# Patient Record
Sex: Female | Born: 1970 | Race: Black or African American | Hispanic: No | Marital: Married | Smoking: Never smoker
Health system: Southern US, Community
[De-identification: ages and names within clinical notes are randomized; demographics above are authoritative.]

## PROBLEM LIST (undated history)

## (undated) DIAGNOSIS — B009 Herpesviral infection, unspecified: Secondary | ICD-10-CM

## (undated) DIAGNOSIS — R55 Syncope and collapse: Secondary | ICD-10-CM

## (undated) DIAGNOSIS — J351 Hypertrophy of tonsils: Secondary | ICD-10-CM

## (undated) DIAGNOSIS — G43909 Migraine, unspecified, not intractable, without status migrainosus: Secondary | ICD-10-CM

## (undated) DIAGNOSIS — I10 Essential (primary) hypertension: Secondary | ICD-10-CM

## (undated) HISTORY — DX: Morbid (severe) obesity due to excess calories: E66.01

## (undated) HISTORY — DX: Migraine, unspecified, not intractable, without status migrainosus: G43.909

## (undated) HISTORY — PX: TUBAL LIGATION: SHX77

## (undated) HISTORY — DX: Essential (primary) hypertension: I10

## (undated) HISTORY — DX: Hypertrophy of tonsils: J35.1

## (undated) HISTORY — DX: Herpesviral infection, unspecified: B00.9

## (undated) HISTORY — DX: Syncope and collapse: R55

---

## 2004-11-14 ENCOUNTER — Ambulatory Visit: Payer: Self-pay | Admitting: Internal Medicine

## 2004-11-25 ENCOUNTER — Ambulatory Visit: Payer: Self-pay | Admitting: Family Medicine

## 2005-01-10 ENCOUNTER — Ambulatory Visit: Payer: Self-pay | Admitting: Family Medicine

## 2005-01-10 ENCOUNTER — Ambulatory Visit: Payer: Self-pay | Admitting: *Deleted

## 2005-10-05 ENCOUNTER — Ambulatory Visit: Payer: Self-pay | Admitting: Family Medicine

## 2006-01-01 ENCOUNTER — Encounter (INDEPENDENT_AMBULATORY_CARE_PROVIDER_SITE_OTHER): Payer: Self-pay | Admitting: Family Medicine

## 2006-01-01 ENCOUNTER — Ambulatory Visit: Payer: Self-pay | Admitting: Family Medicine

## 2006-01-03 ENCOUNTER — Ambulatory Visit: Payer: Self-pay | Admitting: Family Medicine

## 2007-04-30 DIAGNOSIS — B009 Herpesviral infection, unspecified: Secondary | ICD-10-CM | POA: Insufficient documentation

## 2007-04-30 DIAGNOSIS — I10 Essential (primary) hypertension: Secondary | ICD-10-CM | POA: Insufficient documentation

## 2007-04-30 DIAGNOSIS — J351 Hypertrophy of tonsils: Secondary | ICD-10-CM | POA: Insufficient documentation

## 2007-04-30 DIAGNOSIS — M545 Low back pain, unspecified: Secondary | ICD-10-CM | POA: Insufficient documentation

## 2007-04-30 DIAGNOSIS — A6004 Herpesviral vulvovaginitis: Secondary | ICD-10-CM | POA: Insufficient documentation

## 2007-04-30 DIAGNOSIS — G5600 Carpal tunnel syndrome, unspecified upper limb: Secondary | ICD-10-CM | POA: Insufficient documentation

## 2009-04-08 ENCOUNTER — Ambulatory Visit: Payer: Self-pay | Admitting: Family Medicine

## 2009-04-08 LAB — CONVERTED CEMR LAB: Microalb, Ur: 1.56 mg/dL (ref 0.00–1.89)

## 2009-06-10 ENCOUNTER — Telehealth (INDEPENDENT_AMBULATORY_CARE_PROVIDER_SITE_OTHER): Payer: Self-pay | Admitting: *Deleted

## 2009-06-21 ENCOUNTER — Ambulatory Visit: Payer: Self-pay | Admitting: Family Medicine

## 2009-06-21 LAB — CONVERTED CEMR LAB
ALT: 24 units/L (ref 0–35)
AST: 18 units/L (ref 0–37)
Alkaline Phosphatase: 59 units/L (ref 39–117)
Basophils Relative: 0 % (ref 0–1)
CO2: 23 meq/L (ref 19–32)
Calcium: 9.6 mg/dL (ref 8.4–10.5)
Chloride: 103 meq/L (ref 96–112)
Creatinine, Ser: 1.09 mg/dL (ref 0.40–1.20)
Eosinophils Absolute: 0 10*3/uL (ref 0.0–0.7)
Eosinophils Relative: 1 % (ref 0–5)
HCT: 40.4 % (ref 36.0–46.0)
HDL: 65 mg/dL (ref >39)
Lymphs Abs: 1.3 10*3/uL (ref 0.7–4.0)
Monocytes Absolute: 0.3 10*3/uL (ref 0.1–1.0)
Monocytes Relative: 7 % (ref 3–12)
Neutro Abs: 2.5 10*3/uL (ref 1.7–7.7)
RBC: 4.39 M/uL (ref 3.87–5.11)
Total Bilirubin: 0.8 mg/dL (ref 0.3–1.2)
Total Protein: 8 g/dL (ref 6.0–8.3)
VLDL: 23 mg/dL (ref 0–40)
Vit D, 25-Hydroxy: 21 ng/mL — ABNORMAL LOW (ref 30–89)
WBC: 4.2 10*3/uL (ref 4.0–10.5)

## 2009-07-08 ENCOUNTER — Ambulatory Visit: Payer: Self-pay | Admitting: Family Medicine

## 2009-07-13 ENCOUNTER — Ambulatory Visit (HOSPITAL_COMMUNITY): Admission: RE | Admit: 2009-07-13 | Discharge: 2009-07-13 | Payer: Self-pay | Admitting: Internal Medicine

## 2009-07-26 ENCOUNTER — Encounter: Admission: RE | Admit: 2009-07-26 | Discharge: 2009-07-26 | Payer: Self-pay | Admitting: Family Medicine

## 2009-11-05 ENCOUNTER — Ambulatory Visit: Payer: Self-pay | Admitting: Family Medicine

## 2011-03-01 ENCOUNTER — Other Ambulatory Visit: Payer: Self-pay | Admitting: Obstetrics and Gynecology

## 2011-03-01 ENCOUNTER — Encounter: Payer: Self-pay | Admitting: Obstetrics and Gynecology

## 2011-03-01 ENCOUNTER — Ambulatory Visit (INDEPENDENT_AMBULATORY_CARE_PROVIDER_SITE_OTHER): Payer: Self-pay | Admitting: Obstetrics and Gynecology

## 2011-03-01 VITALS — BP 168/102 | HR 68 | Temp 98.0°F | Ht 63.0 in | Wt 242.8 lb

## 2011-03-01 DIAGNOSIS — D259 Leiomyoma of uterus, unspecified: Secondary | ICD-10-CM

## 2011-03-01 NOTE — Progress Notes (Signed)
  Subjective:    Patient ID: Janet Sanchez, female    DOB: [birth year], 40 y.o.   MRN: [id number]  HPI 40 yo G10 [id number] with LMP 02/28/2011 presenting today requesting a follow-up on her fibroid uterus. Patient is currently without any complaints. Denies abnormal bleeding, discharge or pelvic pain. Patient reports normal monthly menses lasting 4-5 days associated with mild cramping and occuring every 28-30 days.   PMH: HTN  PSH: c-section x1 and BTL  POBH: 5 Sabs, 4 FT SVD, 1 c/section  PGynH: Denies cyst, or abnormal pap smear. [facility] and will schedule f/u mammogram next week  Social Hx: Denies drinking, smoking or the use of illicit drugs  Family Hx: HTN, DM, no h/o gyn, colon or breast malignancy  Review of Systems  All other systems reviewed and are negative.       Objective:   Physical Exam  Constitutional: She is oriented to person, place, and time. She appears well-developed and well-nourished.  HENT:  Head: Normocephalic and atraumatic.  Neck: Normal range of motion. Neck supple.  Cardiovascular: Normal rate and regular rhythm.   Pulmonary/Chest: Effort normal and breath sounds normal.  Abdominal: Soft. Bowel sounds are normal. She exhibits no distension and no mass. There is no tenderness. There is no rebound and no guarding.       obese  Neurological: She is alert and oriented to person, place, and time.          Assessment & Plan:  40 yo with asymptomatic fibroid uterus  - Pelvic ultrasound ordered - RTC in 3-4 weeks to discuss results of ultrasound - Schedule mammogram as planned - Patient to f/u with PCP next week for further management of her HTN

## 2011-03-03 ENCOUNTER — Ambulatory Visit (HOSPITAL_COMMUNITY)
Admission: RE | Admit: 2011-03-03 | Discharge: 2011-03-03 | Disposition: A | Discharge status: Home / Self Care | Payer: Self-pay | Source: Ambulatory Visit | Attending: Obstetrics and Gynecology | Admitting: Obstetrics and Gynecology

## 2011-03-03 ENCOUNTER — Ambulatory Visit (HOSPITAL_COMMUNITY): Payer: Self-pay

## 2011-03-03 DIAGNOSIS — D259 Leiomyoma of uterus, unspecified: Secondary | ICD-10-CM

## 2011-03-03 DIAGNOSIS — D251 Intramural leiomyoma of uterus: Secondary | ICD-10-CM | POA: Insufficient documentation

## 2013-05-08 ENCOUNTER — Ambulatory Visit: Payer: Self-pay | Attending: Internal Medicine | Admitting: Internal Medicine

## 2013-05-08 ENCOUNTER — Ambulatory Visit: Payer: Self-pay

## 2013-05-08 ENCOUNTER — Encounter: Payer: Self-pay | Admitting: Internal Medicine

## 2013-05-08 VITALS — BP 141/90 | HR 76 | Temp 98.1°F | Resp 16

## 2013-05-08 DIAGNOSIS — I10 Essential (primary) hypertension: Secondary | ICD-10-CM | POA: Insufficient documentation

## 2013-05-08 DIAGNOSIS — N938 Other specified abnormal uterine and vaginal bleeding: Secondary | ICD-10-CM

## 2013-05-08 DIAGNOSIS — N949 Unspecified condition associated with female genital organs and menstrual cycle: Secondary | ICD-10-CM

## 2013-05-08 LAB — CBC WITH DIFFERENTIAL/PLATELET
Eosinophils Relative: 1 % (ref 0–5)
HCT: 35.6 % — ABNORMAL LOW (ref 36.0–46.0)
Lymphocytes Relative: 41 % (ref 12–46)
Lymphs Abs: 1.6 10*3/uL (ref 0.7–4.0)
Monocytes Relative: 8 % (ref 3–12)
Neutrophils Relative %: 50 % (ref 43–77)
RBC: 4 MIL/uL (ref 3.87–5.11)
RDW: 13.3 % (ref 11.5–15.5)
WBC: 4 10*3/uL (ref 4.0–10.5)

## 2013-05-08 NOTE — Progress Notes (Signed)
Patient ID: Janet Sanchez, female   DOB: [birth year], 42 y.o.   MRN: [id number] Patient Demographics  Janet Sanchez, is a 42 y.o. female  MVH:[id number]  [id number]  DOB - [birth year]  Chief Complaint  Patient presents with  . Establish Care  . Gynecologic Exam        Subjective:   Janet Sanchez today is here to establish primary care. Patient is a 42 year old African American female with history of hypertension presented to the clinic for establishing care.patient reports that she's been having some abdominal cramps and occasional nausea and thinks she is pregnant. States that she did urine pregnancy tests and they were negative. Her last menstrual period was on October 4 of this month. Patient states that it only lasted for 3 days and usually her periods are for 5-6 days. She also states that she felt something kicking in her stomach for one time yesterday and now she's anxious. She wants blood pregnancy test patient also reports that she had tubal ligation done 16 years ago. She has 5 children between the age of 24 and 4..   Patient has No headache, No chest pain, No abdominal pain - No Nausea, No new weakness tingling or numbness, No Cough - SOB  Objective:    Filed Vitals:   05/08/13 1632  BP: 141/90  Pulse: 76  Temp: 98.1 F (36.7 C)  TempSrc: Oral  Resp: 16  SpO2: 97%     ALLERGIES:  No Known Allergies  PAST MEDICAL HISTORY: Past Medical History  Diagnosis Date  . Hypertension     PAST SURGICAL HISTORY: Past Surgical History  Procedure Laterality Date  . Tubal ligation      FAMILY HISTORY: Family History  Problem Relation Age of Onset  . Heart attack Mother   . Hypertension Father   . Heart attack Father   . Hypertension Sister   . Hypertension Brother     MEDICATIONS AT HOME: Prior to Admission medications   Medication Sig Start Date End Date Taking? Authorizing Provider  amLODipine (NORVASC) 10 MG tablet Take 10 mg by mouth daily.   Yes  Historical Provider, MD  lisinopril-hydrochlorothiazide (PRINZIDE,ZESTORETIC) 20-25 MG per tablet Take 1 tablet by mouth daily.   Yes Historical Provider, MD    REVIEW OF SYSTEMS:  Constitutional:   No   Fevers, chills, fatigue.  HEENT:    No headaches, Sore throat,   Cardio-vascular: No chest pain,  Orthopnea, swelling in lower extremities, anasarca, palpitations  GI:  No abdominal pain, nausea, vomiting, diarrhea  Resp: No shortness of breath,  No coughing up of blood.No cough.No wheezing.  Skin:  no rash or lesions.  GU:  no dysuria, change in color of urine, no urgency or frequency.  No flank pain.  Musculoskeletal: No joint pain or swelling.  No decreased range of motion.  No back pain.  Psych: No change in mood or affect. No depression or anxiety.  No memory loss.   Exam  General appearance :Awake, alert, NAD, Speech Clear. HEENT: Atraumatic and Normocephalic, PERLA Neck: supple, no JVD. No cervical lymphadenopathy.  Chest: clear to auscultation bilaterally, no wheezing, rales or rhonchi CVS: S1 S2 regular, no murmurs.  Abdomen: soft, NBS, NT, ND, no gaurding, rigidity or rebound. Extremities: No cyanosis, clubbing, B/L Lower Ext shows no edema,  Neurology: Awake alert, and oriented X 3, CN II-XII intact, Non focal Skin:No Rash or lesions Wounds: N/A    Data Review   Basic Metabolic Panel: No results found for  this basename: NA, K, CL, CO2, GLUCOSE, BUN, CREATININE, CALCIUM, MG, PHOS,  in the last 168 hours Liver Function Tests: No results found for this basename: AST, ALT, ALKPHOS, BILITOT, PROT, ALBUMIN,  in the last 168 hours  CBC: No results found for this basename: WBC, NEUTROABS, HGB, HCT, MCV, PLT,  in the last 168 hours ------------------------------------------------------------------------------------------------------------------ No results found for this basename: HGBA1C,  in the last 72  hours ------------------------------------------------------------------------------------------------------------------ No results found for this basename: CHOL, HDL, LDLCALC, TRIG, CHOLHDL, LDLDIRECT,  in the last 72 hours ------------------------------------------------------------------------------------------------------------------ No results found for this basename: TSH, T4TOTAL, FREET3, T3FREE, THYROIDAB,  in the last 72 hours ------------------------------------------------------------------------------------------------------------------ No results found for this basename: VITAMINB12, FOLATE, FERRITIN, TIBC, IRON, RETICCTPCT,  in the last 72 hours  Coagulation profile  No results found for this basename: INR, PROTIME,  in the last 168 hours    Assessment & Plan   Active Problems: Hypertension - Continue amlodipine, lisinopril/HCTZ. Patient has 3 refills on both the medications, continue  DUB: . with history of tubal ligation, anxious that she may be pregnant - Will get a serum hCG test, explained that if it is positive she will need further care in [facility] - If it is negative then, we can have abdominal ultrasound or CT abdomen scan for further workup for abdominal cramps and nausea or try PPI.  Health screening  - Ambulatory referral to OB/GYN for Pap smear - Hold off on flu shot and mammogram until the pregnancy test results are available  Recommendations:CBC, CMP, lipid panel, serum hCG   Follow-up in 1 months for labs and evaluation, patient will be called if the pregnancy test results tomorrow   Falan Hensler M.D. 05/08/2013, 4:57 PM

## 2013-05-08 NOTE — Progress Notes (Signed)
Pt here to establish care for hx HTN,irregular menstrual periods Requesting blood pregnancy test LMP- 04/29/13

## 2013-05-09 ENCOUNTER — Telehealth: Payer: Self-pay | Admitting: Emergency Medicine

## 2013-05-09 LAB — COMPREHENSIVE METABOLIC PANEL
Chloride: 102 mEq/L (ref 96–112)
Creat: 1.22 mg/dL — ABNORMAL HIGH (ref 0.50–1.10)
Glucose, Bld: 79 mg/dL (ref 70–99)
Potassium: 4 mEq/L (ref 3.5–5.3)
Total Bilirubin: 0.8 mg/dL (ref 0.3–1.2)
Total Protein: 7.7 g/dL (ref 6.0–8.3)

## 2013-05-09 LAB — LIPID PANEL
Cholesterol: 189 mg/dL (ref 0–200)
Total CHOL/HDL Ratio: 2.9 Ratio
Triglycerides: 70 mg/dL (ref <150)
VLDL: 14 mg/dL (ref 0–40)

## 2013-05-09 LAB — HCG, QUANTITATIVE, PREGNANCY: hCG, Beta Chain, Quant, S: <2 m[IU]/mL

## 2013-05-09 NOTE — Telephone Encounter (Signed)
Pt given negative pregnancy results 

## 2013-05-12 ENCOUNTER — Telehealth: Payer: Self-pay | Admitting: Emergency Medicine

## 2013-05-12 NOTE — Telephone Encounter (Signed)
Left

## 2013-05-13 NOTE — Progress Notes (Signed)
Quick Note:  Please let the patient know that her pregnancy test was negative ______ 

## 2013-05-13 NOTE — Progress Notes (Signed)
Quick Note:  Please let the patient know that her pregnancy test was negative ______

## 2013-09-03 ENCOUNTER — Telehealth: Payer: Self-pay | Admitting: *Deleted

## 2013-09-03 NOTE — Telephone Encounter (Signed)
Patient is being referred to [place] from [facility]. We are calling to request that patient keep a Menstrual Diary for at least one month and she will be scheduled for an appointment in March.  I called patient and left message for her to call us back regarding some info for her appt. Referral form given to front desk to schedule patient for appt here.

## 2013-09-08 NOTE — Telephone Encounter (Signed)
Called Janet Sanchez and notified her of her appointment date/ time and to keep menstrual diary. Also that she will need to pay copay $20 and get billed the rest  or if has insurance then whatever copay is for that. [place] voices understanding.

## 2013-09-10 ENCOUNTER — Encounter: Payer: Self-pay | Admitting: *Deleted

## 2013-10-15 ENCOUNTER — Encounter: Payer: Self-pay | Admitting: Obstetrics & Gynecology

## 2013-10-15 ENCOUNTER — Ambulatory Visit (INDEPENDENT_AMBULATORY_CARE_PROVIDER_SITE_OTHER): Payer: No Typology Code available for payment source | Admitting: Obstetrics & Gynecology

## 2013-10-15 VITALS — BP 116/78 | HR 88 | Temp 98.3°F | Ht 63.0 in | Wt 224.0 lb

## 2013-10-15 DIAGNOSIS — Z0000 Encounter for general adult medical examination without abnormal findings: Secondary | ICD-10-CM

## 2013-10-15 LAB — POCT PREGNANCY, URINE: Preg Test, Ur: NEGATIVE

## 2013-10-15 NOTE — Progress Notes (Signed)
Pt. States her periods are lasting a shorter period of time and this has occurred since August 2014- concerning her. Also c/o of lower abdominal/pelvic pain and cramping that occurs every few days and has been occuring since august as well.

## 2013-10-15 NOTE — Progress Notes (Signed)
   Subjective:    Patient ID: Janet Sanchez, female    DOB: 1971-05-08, 43 y.o.   MRN: [id number]  HPI 43 yo M AA P5 (41-16 yo kids) here today with the issue of 1) I want to know if I'm pregnant. She feels lots of movement in her abdomen. She did a blood pregnancy test 10/14 and it was negative. 2) She reports that since 8/14 her periods have become irregular. They will come for 3-4 days and occurs at the end of each month.   Review of Systems Pap and mammogram due    Objective:   Physical Exam        Assessment & Plan:  Preventative care- mammogram ordered.  RTC for annual Pregnancy fear- I offered to check a pregnacy test today.

## 2013-10-23 ENCOUNTER — Other Ambulatory Visit: Payer: Self-pay | Admitting: Obstetrics & Gynecology

## 2013-10-23 ENCOUNTER — Ambulatory Visit (HOSPITAL_COMMUNITY)
Admission: RE | Admit: 2013-10-23 | Discharge: 2013-10-23 | Disposition: A | Discharge status: Home / Self Care | Payer: No Typology Code available for payment source | Source: Ambulatory Visit | Attending: Obstetrics & Gynecology | Admitting: Obstetrics & Gynecology

## 2013-10-23 DIAGNOSIS — N63 Unspecified lump in unspecified breast: Secondary | ICD-10-CM

## 2013-10-23 DIAGNOSIS — Z1231 Encounter for screening mammogram for malignant neoplasm of breast: Secondary | ICD-10-CM

## 2013-10-23 DIAGNOSIS — Z0000 Encounter for general adult medical examination without abnormal findings: Secondary | ICD-10-CM

## 2013-11-06 ENCOUNTER — Telehealth: Payer: Self-pay

## 2013-11-06 DIAGNOSIS — R102 Pelvic and perineal pain: Secondary | ICD-10-CM

## 2013-11-06 NOTE — Telephone Encounter (Signed)
Pt. Called stating "I am calling in regards to requesting an ultrasound. I was seen in your clinic on the 25th of last month and I thought at that time I was going to have an ultrasound. I have been having sharp abdominal pain and abnormal cycles. I don't know if I am pregnant. My cycle comes on at the end of every month. I could have a cyst. My fibroids could be enlarged. I want to know where this pain is coming from." From last visit, pt. Had pregnancy test which was negative. Will send message to Dr. Hulan Fray.

## 2013-11-10 NOTE — Telephone Encounter (Signed)
Spoke to Dr. Hulan Fray who stated order pelvic ultrasound. Pelvic and transvag ordered. Ultrasound scheduled for 11/17/13 at 0730. Called pt. And informed her of date and time. Pt. Verbalized understanding and gratitude; no further questions or concerns.

## 2013-11-17 ENCOUNTER — Ambulatory Visit (HOSPITAL_COMMUNITY)
Admission: RE | Admit: 2013-11-17 | Discharge: 2013-11-17 | Disposition: A | Discharge status: Home / Self Care | Payer: No Typology Code available for payment source | Source: Ambulatory Visit | Attending: Obstetrics & Gynecology | Admitting: Obstetrics & Gynecology

## 2013-11-17 DIAGNOSIS — R102 Pelvic and perineal pain: Secondary | ICD-10-CM

## 2013-11-17 DIAGNOSIS — D252 Subserosal leiomyoma of uterus: Secondary | ICD-10-CM | POA: Insufficient documentation

## 2013-11-17 DIAGNOSIS — N949 Unspecified condition associated with female genital organs and menstrual cycle: Secondary | ICD-10-CM | POA: Insufficient documentation

## 2013-11-17 DIAGNOSIS — Z9851 Tubal ligation status: Secondary | ICD-10-CM | POA: Insufficient documentation

## 2013-11-19 ENCOUNTER — Ambulatory Visit (INDEPENDENT_AMBULATORY_CARE_PROVIDER_SITE_OTHER): Payer: No Typology Code available for payment source | Admitting: Obstetrics & Gynecology

## 2013-11-19 ENCOUNTER — Encounter: Payer: Self-pay | Admitting: Obstetrics & Gynecology

## 2013-11-19 VITALS — BP 131/88 | HR 75 | Temp 97.6°F | Ht 64.0 in | Wt 229.5 lb

## 2013-11-19 DIAGNOSIS — Z0000 Encounter for general adult medical examination without abnormal findings: Secondary | ICD-10-CM

## 2013-11-19 NOTE — Progress Notes (Signed)
Subjective:    Janet Sanchez is a 43 y.o. female who presents for an annual exam. The patient has no complaints today. The patient is sexually active. GYN screening history: last pap: was normal. The patient wears seatbelts: yes. The patient participates in regular exercise: yes. Has the patient ever been transfused or tattooed?: yes. The patient reports that there is not domestic violence in her life.   Menstrual History: OB History   Grav Para Term Preterm Abortions TAB SAB Ect Mult Living   10 5 4 1 5  5   5       Menarche age: 89  Patient's last menstrual period was 11/11/2013.    The following portions of the patient's history were reviewed and updated as appropriate: allergies, current medications, past family history, past medical history, past social history, past surgical history and problem list.  Review of Systems A comprehensive review of systems was negative.  Married for 22 years, denies dyspareunia. Uses BTL for contraception. 5 kids (4 at home). Works as a Physicist, medical.   Objective:    BP 131/88  Pulse 75  Temp(Src) 97.6 F (36.4 C) (Oral)  Ht 5\' 4"  (1.626 m)  Wt 229 lb 8 oz (104.101 kg)  BMI 39.37 kg/m2  LMP 11/11/2013  General Appearance:    Alert, cooperative, no distress, appears stated age  Head:    Normocephalic, without obvious abnormality, atraumatic  Eyes:    PERRL, conjunctiva/corneas clear, EOM's intact, fundi    benign, both eyes  Ears:    Normal TM's and external ear canals, both ears  Nose:   Nares normal, septum midline, mucosa normal, no drainage    or sinus tenderness  Throat:   Lips, mucosa, and tongue normal; teeth and gums normal  Neck:   Supple, symmetrical, trachea midline, no adenopathy;    thyroid:  no enlargement/tenderness/nodules; no carotid   bruit or JVD  Back:     Symmetric, no curvature, ROM normal, no CVA tenderness  Lungs:     Clear to auscultation bilaterally, respirations unlabored  Chest Wall:    No tenderness or  deformity   Heart:    Regular rate and rhythm, S1 and S2 normal, no murmur, rub   or gallop  Breast Exam:    No tenderness, masses, or nipple abnormality  Abdomen:     Soft, non-tender, bowel sounds active all four quadrants,    no masses, no organomegaly, obese  Genitalia:    Normal female without lesion, discharge or tenderness, NSSA, NT, normal adnexal exam     Extremities:   Extremities normal, atraumatic, no cyanosis or edema  Pulses:   2+ and symmetric all extremities  Skin:   Skin color, texture, turgor normal, no rashes or lesions  Lymph nodes:   Cervical, supraclavicular, and axillary nodes normal  Neurologic:   CNII-XII intact, normal strength, sensation and reflexes    throughout  .    Assessment:    Healthy female exam.    Plan:     Breast self exam technique reviewed and patient encouraged to perform self-exam monthly. Mammogram. Thin prep Pap smear.

## 2013-12-03 ENCOUNTER — Ambulatory Visit
Admission: RE | Admit: 2013-12-03 | Discharge: 2013-12-03 | Disposition: A | Discharge status: Home / Self Care | Payer: No Typology Code available for payment source | Source: Ambulatory Visit | Attending: Obstetrics & Gynecology | Admitting: Obstetrics & Gynecology

## 2013-12-03 ENCOUNTER — Encounter (INDEPENDENT_AMBULATORY_CARE_PROVIDER_SITE_OTHER): Payer: Self-pay

## 2013-12-03 DIAGNOSIS — N63 Unspecified lump in unspecified breast: Secondary | ICD-10-CM

## 2014-05-25 ENCOUNTER — Encounter: Payer: Self-pay | Admitting: Obstetrics & Gynecology

## 2014-09-16 ENCOUNTER — Other Ambulatory Visit (HOSPITAL_COMMUNITY): Payer: Self-pay | Admitting: Obstetrics and Gynecology

## 2014-09-16 DIAGNOSIS — Z9851 Tubal ligation status: Secondary | ICD-10-CM

## 2014-09-23 ENCOUNTER — Ambulatory Visit (HOSPITAL_COMMUNITY)
Admission: RE | Admit: 2014-09-23 | Discharge: 2014-09-23 | Disposition: A | Discharge status: Home / Self Care | Payer: 59 | Source: Ambulatory Visit | Attending: Obstetrics and Gynecology | Admitting: Obstetrics and Gynecology

## 2014-09-23 DIAGNOSIS — Z9851 Tubal ligation status: Secondary | ICD-10-CM

## 2014-09-23 DIAGNOSIS — N971 Female infertility of tubal origin: Secondary | ICD-10-CM | POA: Diagnosis not present

## 2014-09-23 MED ORDER — IOHEXOL 300 MG/ML  SOLN
20.0000 mL | Freq: Once | INTRAMUSCULAR | Status: AC | PRN
  Administered 2014-09-23: 20 mL

## 2016-06-30 ENCOUNTER — Ambulatory Visit: Payer: No Typology Code available for payment source | Admitting: Adult Health

## 2016-07-06 ENCOUNTER — Ambulatory Visit (INDEPENDENT_AMBULATORY_CARE_PROVIDER_SITE_OTHER): Payer: BLUE CROSS/BLUE SHIELD | Admitting: Adult Health

## 2016-07-06 ENCOUNTER — Encounter: Payer: Self-pay | Admitting: Adult Health

## 2016-07-06 VITALS — BP 120/70 | Temp 97.9°F | Ht 64.0 in | Wt 244.9 lb

## 2016-07-06 DIAGNOSIS — I10 Essential (primary) hypertension: Secondary | ICD-10-CM | POA: Diagnosis not present

## 2016-07-06 DIAGNOSIS — Z7689 Persons encountering health services in other specified circumstances: Secondary | ICD-10-CM | POA: Diagnosis not present

## 2016-07-06 DIAGNOSIS — Z760 Encounter for issue of repeat prescription: Secondary | ICD-10-CM

## 2016-07-06 DIAGNOSIS — R05 Cough: Secondary | ICD-10-CM

## 2016-07-06 DIAGNOSIS — R059 Cough, unspecified: Secondary | ICD-10-CM

## 2016-07-06 MED ORDER — LISINOPRIL 30 MG PO TABS: 30.0000 mg | ORAL_TABLET | Freq: Every day | ORAL | 3 refills | Status: DC

## 2016-07-06 MED ORDER — HYDROCODONE-HOMATROPINE 5-1.5 MG/5ML PO SYRP: 5.0000 mL | ORAL_SOLUTION | Freq: Three times a day (TID) | ORAL | 0 refills | Status: DC | PRN

## 2016-07-06 NOTE — Progress Notes (Signed)
Patient presents to clinic today to establish care. She is a pleasant 45 year old female who  has a past medical history of Herpes; Hypertension; Migraines; and Morbid obesity ([place]).   Her last physical was greater than 1 year ago   Acute Concerns: Establish Care  Cough - Non productive cough x 1 weeks. She denies any fevers or feeling acutely ill. He denies any sinus pain or pressure area has not had any nausea, vomiting,or diarrhea  Chronic Issues: Hypertension  - She reports that she has previously taken amlodipine 10 mg as well as lisinopril 20 mg with 25 mg of hydrochlorothiazide. Her previous provider had done kidney function panel on 05/24/2016 at which time her GFR was 59. At this time patient stopped her lisinopril because she thought it was hurting her kidneys. Since that time she has restarted the lisinopril/hydrochlorothiazide to control her blood pressure. At the time of her renal function panel her creatinine was 1.26. In the office today her blood pressure is very well controlled  Migraines  - She was taking propranolol but she reports that it caused her left knee to swell. She has maybe 5 migraines per month and is controlled with Motrin.    Health Maintenance: Dental -- Routine  Vision -- Routine  Immunizations -- Refuses flu shots.  Colonoscopy -- Never had Mammogram -- One year ago  PAP -- 2017  Bone Density -- Never had   Diet: She does not follow specific diet and does not always eat a heart healthy diet Exercise: She tries to work out three days per week  She is not followed by anyone.    Past Medical History:  Diagnosis Date  . Herpes   . Hypertension   . Migraines   . Morbid obesity ([place])     Past Surgical History:  Procedure Laterality Date  . TUBAL LIGATION      No current outpatient prescriptions on file prior to visit.   No current facility-administered medications on file prior to visit.     No Known Allergies  Family History    Problem Relation Age of Onset  . Heart attack Mother   . Hypertension Father   . Heart attack Father   . Hypertension Sister   . Hypertension Brother   . Hypertension Sister     Social History   Social History  . Marital status: Married    Spouse name: N/A  . Number of children: N/A  . Years of education: N/A   Occupational History  . Not on file.   Social History Main Topics  . Smoking status: Never Smoker  . Smokeless tobacco: Never Used  . Alcohol use No  . Drug use: No  . Sexual activity: Yes    Birth control/ protection: Surgical   Other Topics Concern  . 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   HENT: Negative.   Eyes: Negative.   Respiratory: Positive for cough.   Cardiovascular: Negative.   Gastrointestinal: Negative.   Genitourinary: Negative.   Musculoskeletal: Negative.   Skin: Negative.   Neurological: Negative.   Endo/Heme/Allergies: Negative.   Psychiatric/Behavioral: Negative.   All other systems reviewed and are negative.   BP 120/70   Temp 97.9 F (36.6 C) (Oral)   Ht 5'  4" (1.626 m)   Wt 244 lb 14.4 oz (111.1 kg)   BMI 42.04 kg/m   Physical Exam  Constitutional: She is oriented to person, place, and time and well-developed, well-nourished, and in no distress. No distress.  obese  HENT:  Head: Normocephalic and atraumatic.  Right Ear: External ear normal.  Left Ear: External ear normal.  Nose: Nose normal.  Mouth/Throat: Oropharynx is clear and moist. No oropharyngeal exudate.  Hypertrophic tonsils  Eyes: Conjunctivae and EOM are normal. Pupils are equal, round, and reactive to light. Right eye exhibits no discharge. Left eye exhibits no discharge. No scleral icterus.  Neck: Normal range of motion. Neck supple.  Cardiovascular: Normal rate, regular rhythm, normal heart sounds and  intact distal pulses.  Exam reveals no gallop and no friction rub.   No murmur heard. Pulmonary/Chest: Effort normal and breath sounds normal. No respiratory distress. She has no wheezes. She has no rales. She exhibits no tenderness.  Abdominal: Soft. Bowel sounds are normal. She exhibits no distension and no mass. There is no tenderness. There is no rebound and no guarding.  Musculoskeletal: Normal range of motion. She exhibits no edema, tenderness or deformity.  Lymphadenopathy:    She has no cervical adenopathy.  Neurological: She is alert and oriented to person, place, and time. She displays normal reflexes. No cranial nerve deficit. She exhibits normal muscle tone. Gait normal. Coordination normal. GCS score is 15.  Skin: Skin is warm and dry. No rash noted. She is not diaphoretic. No erythema. No pallor.  Psychiatric: Mood, memory, affect and judgment normal.  Nursing note and vitals reviewed.   Assessment/Plan: 1. Encounter to establish care - Follow up for CPE and any acute issue - Educated on the importance of a heart healthy diet and frequent exercise 2. Essential hypertension - Dehydration versus medication causing acute kidney injury. Her blood pressure is well controlled, I will have her come off hydrochlorothiazide will increase lisinopril to 30 mg. We'll follow-up with labs during her complete physical exam - lisinopril (PRINIVIL,ZESTRIL) 30 MG tablet; Take 1 tablet (30 mg total) by mouth daily.  Dispense: 30 tablet; Refill: 3  3. Morbid obesity ([place]) - Educated on the importance of diet and exercise  4. Cough - HYDROcodone-homatropine (HYCODAN) 5-1.5 MG/5ML syrup; Take 5 mLs by mouth every 8 (eight) hours as needed for cough.  Dispense: 120 mL; Refill: 0 - Follow-up if no improvement in the next week  5. Medication refill - lisinopril (PRINIVIL,ZESTRIL) 30 MG tablet; Take 1 tablet (30 mg total) by mouth daily.  Dispense: 30 tablet; Refill: 3 - acyclovir (ZOVIRAX) 400 MG  tablet; Take 1 tablet (400 mg total) by mouth 2 (two) times daily.  Dispense: 180 tablet; Refill: 3  [name], NP

## 2016-07-06 NOTE — Patient Instructions (Signed)
It was great meeting you today  I have increased your lisinopril to 30 mg. Stop taking HCTZ  Use the cough medication at night to help with your symptoms  Please follow up with me for your physical

## 2016-07-07 ENCOUNTER — Encounter: Payer: Self-pay | Admitting: Adult Health

## 2016-07-07 MED ORDER — ACYCLOVIR 400 MG PO TABS: 400.0000 mg | ORAL_TABLET | Freq: Two times a day (BID) | ORAL | 3 refills | Status: DC

## 2016-07-11 ENCOUNTER — Other Ambulatory Visit (INDEPENDENT_AMBULATORY_CARE_PROVIDER_SITE_OTHER): Payer: BLUE CROSS/BLUE SHIELD

## 2016-07-11 DIAGNOSIS — Z0000 Encounter for general adult medical examination without abnormal findings: Secondary | ICD-10-CM

## 2016-07-11 LAB — CBC WITH DIFFERENTIAL/PLATELET
BASOS PCT: 0.4 % (ref 0.0–3.0)
Basophils Absolute: 0 10*3/uL (ref 0.0–0.1)
EOS PCT: 1.7 % (ref 0.0–5.0)
Eosinophils Absolute: 0.1 10*3/uL (ref 0.0–0.7)
HEMATOCRIT: 35.6 % — AB (ref 36.0–46.0)
Hemoglobin: 11.9 g/dL — ABNORMAL LOW (ref 12.0–15.0)
LYMPHS PCT: 34 % (ref 12.0–46.0)
Lymphs Abs: 1.5 10*3/uL (ref 0.7–4.0)
MCHC: 33.4 g/dL (ref 30.0–36.0)
MCV: 91.7 fl (ref 78.0–100.0)
MONOS PCT: 6.7 % (ref 3.0–12.0)
Monocytes Absolute: 0.3 10*3/uL (ref 0.1–1.0)
Neutro Abs: 2.5 10*3/uL (ref 1.4–7.7)
Neutrophils Relative %: 57.2 % (ref 43.0–77.0)
Platelets: 251 10*3/uL (ref 150.0–400.0)
RBC: 3.88 Mil/uL (ref 3.87–5.11)
RDW: 13 % (ref 11.5–15.5)
WBC: 4.4 10*3/uL (ref 4.0–10.5)

## 2016-07-11 LAB — BASIC METABOLIC PANEL
BUN: 14 mg/dL (ref 6–23)
CHLORIDE: 103 meq/L (ref 96–112)
CO2: 28 meq/L (ref 19–32)
Calcium: 8.7 mg/dL (ref 8.4–10.5)
Creatinine, Ser: 1.04 mg/dL (ref 0.40–1.20)
GFR: 73.56 mL/min (ref >60.00)
Glucose, Bld: 88 mg/dL (ref 70–99)
Potassium: 4 mEq/L (ref 3.5–5.1)
SODIUM: 137 meq/L (ref 135–145)

## 2016-07-11 LAB — LIPID PANEL
CHOL/HDL RATIO: 3
Cholesterol: 190 mg/dL (ref 0–200)
HDL: 61.6 mg/dL (ref >39.00)
LDL Cholesterol: 119 mg/dL — ABNORMAL HIGH (ref 0–99)
NONHDL: 128.23
Triglycerides: 47 mg/dL (ref 0.0–149.0)
VLDL: 9.4 mg/dL (ref 0.0–40.0)

## 2016-07-11 LAB — POC URINALSYSI DIPSTICK (AUTOMATED)
BILIRUBIN UA: NEGATIVE
GLUCOSE UA: NEGATIVE
KETONES UA: NEGATIVE
Leukocytes, UA: NEGATIVE
NITRITE UA: NEGATIVE
Protein, UA: NEGATIVE
RBC UA: NEGATIVE
Spec Grav, UA: <=1.005
Urobilinogen, UA: 0.2
pH, UA: 6

## 2016-07-11 LAB — HEPATIC FUNCTION PANEL
ALK PHOS: 70 U/L (ref 39–117)
ALT: 21 U/L (ref 0–35)
AST: 21 U/L (ref 0–37)
Albumin: 3.7 g/dL (ref 3.5–5.2)
BILIRUBIN DIRECT: 0.1 mg/dL (ref 0.0–0.3)
BILIRUBIN TOTAL: 0.5 mg/dL (ref 0.2–1.2)
Total Protein: 6.7 g/dL (ref 6.0–8.3)

## 2016-07-11 LAB — TSH: TSH: 1.91 u[IU]/mL (ref 0.35–4.50)

## 2016-07-14 ENCOUNTER — Ambulatory Visit (INDEPENDENT_AMBULATORY_CARE_PROVIDER_SITE_OTHER): Payer: BLUE CROSS/BLUE SHIELD | Admitting: Adult Health

## 2016-07-14 ENCOUNTER — Encounter: Payer: Self-pay | Admitting: Adult Health

## 2016-07-14 VITALS — BP 148/96 | Temp 98.2°F | Ht 64.0 in | Wt 252.8 lb

## 2016-07-14 DIAGNOSIS — I10 Essential (primary) hypertension: Secondary | ICD-10-CM | POA: Diagnosis not present

## 2016-07-14 DIAGNOSIS — G43811 Other migraine, intractable, with status migrainosus: Secondary | ICD-10-CM | POA: Diagnosis not present

## 2016-07-14 DIAGNOSIS — Z0000 Encounter for general adult medical examination without abnormal findings: Secondary | ICD-10-CM

## 2016-07-14 MED ORDER — IBUPROFEN 600 MG PO TABS: 600.0000 mg | ORAL_TABLET | Freq: Three times a day (TID) | ORAL | 0 refills | Status: DC | PRN

## 2016-07-14 MED ORDER — HYDROCHLOROTHIAZIDE 12.5 MG PO TABS: 12.5000 mg | ORAL_TABLET | Freq: Every day | ORAL | 3 refills | Status: DC

## 2016-07-14 NOTE — Patient Instructions (Addendum)
It was great seeing you today!   I have prescribed a small dose of HCTZ 12.5 mg to help with the blood pressure and ankle swelling.   I have also sent in a prescription for Motrin 800 mg.   Please work on diet and exercise.   I would like to see you in one month for a blood pressure follow up.   Have a Mery Christmas!     Health Maintenance, Female Introduction Adopting a healthy lifestyle and getting preventive care can go a long way to promote health and wellness. Talk with your health care provider about what schedule of regular examinations is right for you. This is a good chance for you to check in with your provider about disease prevention and staying healthy. In between checkups, there are plenty of things you can do on your own. Experts have done a lot of research about which lifestyle changes and preventive measures are most likely to keep you healthy. Ask your health care provider for more information. Weight and diet Eat a healthy diet  Be sure to include plenty of vegetables, fruits, low-fat dairy products, and lean protein.  Do not eat a lot of foods high in solid fats, added sugars, or salt.  Get regular exercise. This is one of the most important things you can do for your health.  Most adults should exercise for at least 150 minutes each week. The exercise should increase your heart rate and make you sweat (moderate-intensity exercise).  Most adults should also do strengthening exercises at least twice a week. This is in addition to the moderate-intensity exercise. Maintain a healthy weight  Body mass index (BMI) is a measurement that can be used to identify possible weight problems. It estimates body fat based on height and weight. Your health care provider can help determine your BMI and help you achieve or maintain a healthy weight.  For females 34 years of age and older:  A BMI below 18.5 is considered underweight.  A BMI of 18.5 to 24.9 is normal.  A BMI  of 25 to 29.9 is considered overweight.  A BMI of 30 and above is considered obese. Watch levels of cholesterol and blood lipids  You should start having your blood tested for lipids and cholesterol at 45 years of age, then have this test every 5 years.  You may need to have your cholesterol levels checked more often if:  Your lipid or cholesterol levels are high.  You are older than 45 years of age.  You are at high risk for heart disease. Cancer screening Lung Cancer  Lung cancer screening is recommended for adults 33-23 years old who are at high risk for lung cancer because of a history of smoking.  A yearly low-dose CT scan of the lungs is recommended for people who:  Currently smoke.  Have quit within the past 15 years.  Have at least a 30-pack-year history of smoking. A pack year is smoking an average of one pack of cigarettes a day for 1 year.  Yearly screening should continue until it has been 15 years since you quit.  Yearly screening should stop if you develop a health problem that would prevent you from having lung cancer treatment. Breast Cancer  Practice breast self-awareness. This means understanding how your breasts normally appear and feel.  It also means doing regular breast self-exams. Let your health care provider know about any changes, no matter how small.  If you are in your 63s  or 79s, you should have a clinical breast exam (CBE) by a health care provider every 1-3 years as part of a regular health exam.  If you are 40 or older, have a CBE every year. Also consider having a breast X-ray (mammogram) every year.  If you have a family history of breast cancer, talk to your health care provider about genetic screening.  If you are at high risk for breast cancer, talk to your health care provider about having an MRI and a mammogram every year.  Breast cancer gene (BRCA) assessment is recommended for women who have family members with BRCA-related cancers.  BRCA-related cancers include:  Breast.  Ovarian.  Tubal.  Peritoneal cancers.  Results of the assessment will determine the need for genetic counseling and BRCA1 and BRCA2 testing. Cervical Cancer  Your health care provider may recommend that you be screened regularly for cancer of the pelvic organs (ovaries, uterus, and vagina). This screening involves a pelvic examination, including checking for microscopic changes to the surface of your cervix (Pap test). You may be encouraged to have this screening done every 3 years, beginning at age 9.  For women ages 76-65, health care providers may recommend pelvic exams and Pap testing every 3 years, or they may recommend the Pap and pelvic exam, combined with testing for human papilloma virus (HPV), every 5 years. Some types of HPV increase your risk of cervical cancer. Testing for HPV may also be done on women of any age with unclear Pap test results.  Other health care providers may not recommend any screening for nonpregnant women who are considered low risk for pelvic cancer and who do not have symptoms. Ask your health care provider if a screening pelvic exam is right for you.  If you have had past treatment for cervical cancer or a condition that could lead to cancer, you need Pap tests and screening for cancer for at least 20 years after your treatment. If Pap tests have been discontinued, your risk factors (such as having a new sexual partner) need to be reassessed to determine if screening should resume. Some women have medical problems that increase the chance of getting cervical cancer. In these cases, your health care provider may recommend more frequent screening and Pap tests. Colorectal Cancer  This type of cancer can be detected and often prevented.  Routine colorectal cancer screening usually begins at 45 years of age and continues through 45 years of age.  Your health care provider may recommend screening at an earlier age if you  have risk factors for colon cancer.  Your health care provider may also recommend using home test kits to check for hidden blood in the stool.  A small camera at the end of a tube can be used to examine your colon directly (sigmoidoscopy or colonoscopy). This is done to check for the earliest forms of colorectal cancer.  Routine screening usually begins at age 61.  Direct examination of the colon should be repeated every 5-10 years through 45 years of age. However, you may need to be screened more often if early forms of precancerous polyps or small growths are found. Skin Cancer  Check your skin from head to toe regularly.  Tell your health care provider about any new moles or changes in moles, especially if there is a change in a mole's shape or color.  Also tell your health care provider if you have a mole that is larger than the size of a pencil eraser.  Always use sunscreen. Apply sunscreen liberally and repeatedly throughout the day.  Protect yourself by wearing long sleeves, pants, a wide-brimmed hat, and sunglasses whenever you are outside. Heart disease, diabetes, and high blood pressure  High blood pressure causes heart disease and increases the risk of stroke. High blood pressure is more likely to develop in:  People who have blood pressure in the high end of the normal range (130-139/85-89 mm Hg).  People who are overweight or obese.  People who are African American.  If you are 57-80 years of age, have your blood pressure checked every 3-5 years. If you are 16 years of age or older, have your blood pressure checked every year. You should have your blood pressure measured twice-once when you are at a hospital or clinic, and once when you are not at a hospital or clinic. Record the average of the two measurements. To check your blood pressure when you are not at a hospital or clinic, you can use:  An automated blood pressure machine at a pharmacy.  A home blood pressure  monitor.  If you are between 34 years and 58 years old, ask your health care provider if you should take aspirin to prevent strokes.  Have regular diabetes screenings. This involves taking a blood sample to check your fasting blood sugar level.  If you are at a normal weight and have a low risk for diabetes, have this test once every three years after 45 years of age.  If you are overweight and have a high risk for diabetes, consider being tested at a younger age or more often. Preventing infection Hepatitis B  If you have a higher risk for hepatitis B, you should be screened for this virus. You are considered at high risk for hepatitis B if:  You were born in a country where hepatitis B is common. Ask your health care provider which countries are considered high risk.  Your parents were born in a high-risk country, and you have not been immunized against hepatitis B (hepatitis B vaccine).  You have HIV or AIDS.  You use needles to inject street drugs.  You live with someone who has hepatitis B.  You have had sex with someone who has hepatitis B.  You get hemodialysis treatment.  You take certain medicines for conditions, including cancer, organ transplantation, and autoimmune conditions. Hepatitis C  Blood testing is recommended for:  Everyone born from [birth year] through 1965.  Anyone with known risk factors for hepatitis C. Sexually transmitted infections (STIs)  You should be screened for sexually transmitted infections (STIs) including gonorrhea and chlamydia if:  You are sexually active and are younger than 45 years of age.  You are older than 45 years of age and your health care provider tells you that you are at risk for this type of infection.  Your sexual activity has changed since you were last screened and you are at an increased risk for chlamydia or gonorrhea. Ask your health care provider if you are at risk.  If you do not have HIV, but are at risk, it may be  recommended that you take a prescription medicine daily to prevent HIV infection. This is called pre-exposure prophylaxis (PrEP). You are considered at risk if:  You are sexually active and do not regularly use condoms or know the HIV status of your partner(s).  You take drugs by injection.  You are sexually active with a partner who has HIV. Talk with your health care provider  about whether you are at high risk of being infected with HIV. If you choose to begin PrEP, you should first be tested for HIV. You should then be tested every 3 months for as long as you are taking PrEP. Pregnancy  If you are premenopausal and you may become pregnant, ask your health care provider about preconception counseling.  If you may become pregnant, take 400 to 800 micrograms (mcg) of folic acid every day.  If you want to prevent pregnancy, talk to your health care provider about birth control (contraception). Osteoporosis and menopause  Osteoporosis is a disease in which the bones lose minerals and strength with aging. This can result in serious bone fractures. Your risk for osteoporosis can be identified using a bone density scan.  If you are 81 years of age or older, or if you are at risk for osteoporosis and fractures, ask your health care provider if you should be screened.  Ask your health care provider whether you should take a calcium or vitamin D supplement to lower your risk for osteoporosis.  Menopause may have certain physical symptoms and risks.  Hormone replacement therapy may reduce some of these symptoms and risks. Talk to your health care provider about whether hormone replacement therapy is right for you. Follow these instructions at home:  Schedule regular health, dental, and eye exams.  Stay current with your immunizations.  Do not use any tobacco products including cigarettes, chewing tobacco, or electronic cigarettes.  If you are pregnant, do not drink alcohol.  If you are  breastfeeding, limit how much and how often you drink alcohol.  Limit alcohol intake to no more than 1 drink per day for nonpregnant women. One drink equals 12 ounces of beer, 5 ounces of wine, or 1 ounces of hard liquor.  Do not use street drugs.  Do not share needles.  Ask your health care provider for help if you need support or information about quitting drugs.  Tell your health care provider if you often feel depressed.  Tell your health care provider if you have ever been abused or do not feel safe at home. This information is not intended to replace advice given to you by your health care provider. Make sure you discuss any questions you have with your health care provider. Document Released: 01/23/2011 Document Revised: 12/16/2015 Document Reviewed: 04/13/2015  2017 [employer]vier

## 2016-07-14 NOTE — Progress Notes (Signed)
Subjective:    Patient ID: Janet Sanchez, female    DOB: 09/17/1970, 44 y.o.   MRN: [id number]  HPI  Patient presents for yearly preventative medicine examination. She is a pleasant 45 year old female who  has a past medical history of Enlarged tonsils; Herpes; Hypertension; Migraines; and Morbid obesity ([place]).   All immunizations and health maintenance protocols were reviewed with the patient and needed orders were placed.  Medication reconciliation,  past medical history, social history, problem list and allergies were reviewed in detail with the patient  Goals were established with regard to weight loss, exercise, and  diet in compliance with medications. She is not exercising and is not eating healthy.   She is up to date on her GYN exams. She is due for her mammogram and will have this done at her GYN. She does self breast exams. She is up to date on her eye and dental visits.   She reports that since stopping HCTZ that her left ankle has been swelling. She has not been monitoring her blood pressure at home    Review of Systems  Constitutional: Negative.   HENT: Negative.   Eyes: Negative.   Respiratory: Negative.   Cardiovascular: Negative.   Gastrointestinal: Negative.   Endocrine: Negative.   Genitourinary: Negative.   Musculoskeletal: Negative.   Skin: Negative.   Allergic/Immunologic: Negative.   Neurological: Negative.   Hematological: Negative.   All other systems reviewed and are negative.  Past Medical History:  Diagnosis Date  . Enlarged tonsils   . Herpes   . Hypertension   . Migraines   . Morbid obesity ([place])     Social History   Social History  . Marital status: Married    Spouse name: N/A  . Number of children: N/A  . Years of education: N/A   Occupational History  . Not on file.   Social History Main Topics  . Smoking status: Never Smoker  . Smokeless tobacco: Never Used  . Alcohol use No  . Drug use: No  . Sexual activity: Yes     Birth control/ protection: Surgical   Other Topics Concern  . 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 TUBAL LIGATION      Family History  Problem Relation Age of Onset  . Heart attack Mother   . Hypertension Father   . Heart attack Father   . Hypertension Sister   . Hypertension Brother   . Hypertension Sister     No Known Allergies  Current Outpatient Prescriptions on File Prior to Visit  Medication Sig Dispense Refill  . acyclovir (ZOVIRAX) 400 MG tablet Take 1 tablet (400 mg total) by mouth 2 (two) times daily. 180 tablet 3  . lisinopril (PRINIVIL,ZESTRIL) 30 MG tablet Take 1 tablet (30 mg total) by mouth daily. 30 tablet 3  . olopatadine (PATANOL) 0.1 % ophthalmic solution 1 drop 2 (two) times daily.    Marland Kitchen HYDROcodone-homatropine (HYCODAN) 5-1.5 MG/5ML syrup Take 5 mLs by mouth every 8 (eight) hours as needed for cough. (Patient not taking: Reported on 07/14/2016) 120 mL 0   No current facility-administered medications on file prior to visit.     BP (!) 148/96   Temp 98.2 F (36.8 C) (Oral)  Ht 5\' 4"  (1.626 m)   Wt 252 lb 12.8 oz (114.7 kg)   BMI 43.39 kg/m       Objective:   Physical Exam  Constitutional: She is oriented to person, place, and time. She appears well-developed and well-nourished. No distress.  obese  HENT:  Head: Normocephalic and atraumatic.  Right Ear: Hearing, tympanic membrane, external ear and ear canal normal.  Left Ear: Tympanic membrane, external ear and ear canal normal.  Nose: Nose normal.  Mouth/Throat: Uvula is midline, oropharynx is clear and moist and mucous membranes are normal. No oropharyngeal exudate, posterior oropharyngeal edema, posterior oropharyngeal erythema or tonsillar abscesses.  Eyes: Conjunctivae and EOM are normal. Pupils are equal, round, and reactive  to light. Right eye exhibits no discharge. Left eye exhibits no discharge. No scleral icterus.  Neck: Normal range of motion. Neck supple. No JVD present. No tracheal deviation present. No thyromegaly present.  Cardiovascular: Normal rate, regular rhythm, normal heart sounds and intact distal pulses.  Exam reveals no gallop and no friction rub.   No murmur heard. Pulmonary/Chest: Effort normal and breath sounds normal. No stridor. No respiratory distress. She has no wheezes. She has no rales. She exhibits no tenderness.  Abdominal: Soft. Bowel sounds are normal. She exhibits no distension and no mass. There is no tenderness. There is no rebound and no guarding.  Genitourinary:  Genitourinary Comments: Breast exam: No masses. Lumps, dimpling, or discharge   Musculoskeletal: Normal range of motion. She exhibits edema (non pitting edema noted in left ankle and lower leg. ). She exhibits no tenderness or deformity.  Lymphadenopathy:    She has no cervical adenopathy.  Neurological: She is alert and oriented to person, place, and time. She has normal reflexes. She displays normal reflexes. No cranial nerve deficit. She exhibits normal muscle tone. Coordination normal.  Skin: Skin is warm and dry. No rash noted. She is not diaphoretic. No erythema. No pallor.  Psychiatric: She has a normal mood and affect. Her behavior is normal. Judgment and thought content normal.  Nursing note and vitals reviewed.     Assessment & Plan:  1. Routine general medical examination at a health care facility - Labs reviewed in detail. All questions answered - She needs to lose weight through diet and exercise.  - Follow up in one year for CPE  - Follow up with GYN   2. Essential hypertension - Will start low dose of HCTZ for blood pressure control and edema - hydrochlorothiazide (HYDRODIURIL) 12.5 MG tablet; Take 1 tablet (12.5 mg total) by mouth daily.  Dispense: 90 tablet; Refill: 3 - Monitor BP at home and bring  log to visit in one month   3. Morbid obesity ([place]) - Educated on the importance of diet and exercise   4. Other migraine with status migrainosus, intractable - She does not want anything for prophylaxis treatment at this time. She is going to try having her Tragus pierced - ibuprofen (ADVIL,MOTRIN) 600 MG tablet; Take 1 tablet (600 mg total) by mouth every 8 (eight) hours as needed.  Dispense: 30 tablet; Refill: 0  [name], NP

## 2016-07-14 NOTE — Progress Notes (Signed)
Pre visit review using our clinic review tool, if applicable. No additional management support is needed unless otherwise documented below in the visit note. 

## 2016-08-17 ENCOUNTER — Encounter: Payer: Self-pay | Admitting: Adult Health

## 2016-08-17 ENCOUNTER — Ambulatory Visit (INDEPENDENT_AMBULATORY_CARE_PROVIDER_SITE_OTHER): Payer: BLUE CROSS/BLUE SHIELD | Admitting: Adult Health

## 2016-08-17 ENCOUNTER — Ambulatory Visit: Payer: BLUE CROSS/BLUE SHIELD | Admitting: Adult Health

## 2016-08-17 VITALS — BP 138/78 | Temp 98.6°F | Ht 64.0 in | Wt 249.6 lb

## 2016-08-17 DIAGNOSIS — I10 Essential (primary) hypertension: Secondary | ICD-10-CM

## 2016-08-17 DIAGNOSIS — R5383 Other fatigue: Secondary | ICD-10-CM

## 2016-08-17 MED ORDER — HYDROCHLOROTHIAZIDE 25 MG PO TABS: 25.0000 mg | ORAL_TABLET | Freq: Every day | ORAL | 1 refills | Status: DC

## 2016-08-17 NOTE — Progress Notes (Signed)
Subjective:    Patient ID: [name], female    DOB: Nov 30, 1970, 46 y.o.   MRN: [id number]  HPI   46 year old female who presents to the office today for follow up regarding hypertension. During her visit one month ago she was restarted on HCTZ for lower extremity edema and blood pressure that was not at goal. She has been taking her medications as directed. She reports that she no longer has lower extremity edema. She is not monitoring her blood pressure at home.   She reports that she has been working on her diet and has started an exercise program. She is frustrated that she has not loss more weight. She is down three pounds this month. Despite working out she feels as though she has a low energy level. She was vitamin D deficient in the past and is wondering if she needs to start taking vitamin D tablets again.   BP Readings from Last 3 Encounters:  08/17/16 138/78  07/14/16 (!) 148/96  07/06/16 120/70   Wt Readings from Last 3 Encounters:  08/17/16 249 lb 9.6 oz (113.2 kg)  07/14/16 252 lb 12.8 oz (114.7 kg)  07/06/16 244 lb 14.4 oz (111.1 kg)    Review of Systems See HPI  Past Medical History:  Diagnosis Date  . Enlarged tonsils   . Herpes   . Hypertension   . Migraines   . Morbid obesity ([place])     Social History   Social History  . Marital status: Married    Spouse name: N/A  . Number of children: N/A  . Years of education: N/A   Occupational History  . Not on file.   Social History Main Topics  . Smoking status: Never Smoker  . Smokeless tobacco: Never Used  . Alcohol use No  . Drug use: No  . Sexual activity: Yes    Birth control/ protection: Surgical   Other Topics Concern  . 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 TUBAL LIGATION      Family History  Problem Relation Age of  Onset  . Heart attack Mother   . Hypertension Father   . Heart attack Father   . Hypertension Sister   . Hypertension Brother   . Hypertension Sister     No Known Allergies  Current Outpatient Prescriptions on File Prior to Visit  Medication Sig Dispense Refill  . acyclovir (ZOVIRAX) 400 MG tablet Take 1 tablet (400 mg total) by mouth 2 (two) times daily. 180 tablet 3  . HYDROcodone-homatropine (HYCODAN) 5-1.5 MG/5ML syrup Take 5 mLs by mouth every 8 (eight) hours as needed for cough. 120 mL 0  . ibuprofen (ADVIL,MOTRIN) 600 MG tablet Take 1 tablet (600 mg total) by mouth every 8 (eight) hours as needed. 30 tablet 0  . lisinopril (PRINIVIL,ZESTRIL) 30 MG tablet Take 1 tablet (30 mg total) by mouth daily. 30 tablet 3  . olopatadine (PATANOL) 0.1 % ophthalmic solution 1 drop 2 (two) times daily.     No current facility-administered medications on file prior to visit.     BP 138/78   Temp 98.6 F (37 C) (Oral)   Ht 5\' 4"  (1.626 m)   Wt 249 lb 9.6 oz (113.2 kg)   BMI 42.84  kg/m       Objective:   Physical Exam  Constitutional: She is oriented to person, place, and time. She appears well-developed and well-nourished. No distress.  Cardiovascular: Normal rate, regular rhythm, normal heart sounds and intact distal pulses.  Exam reveals no gallop and no friction rub.   No murmur heard. Pulmonary/Chest: Effort normal and breath sounds normal. No respiratory distress. She has no wheezes. She has no rales. She exhibits no tenderness.  Neurological: She is alert and oriented to person, place, and time.  Skin: Skin is warm and dry. No rash noted. She is not diaphoretic. No erythema. No pallor.  Psychiatric: She has a normal mood and affect. Her behavior is normal. Judgment and thought content normal.  Nursing note and vitals reviewed.      Assessment & Plan:  1. Essential hypertension - Not ago goal but improved. Will increase HCTZ to 25 mg daily.  - Basic metabolic panel -  hydrochlorothiazide (HYDRODIURIL) 25 MG tablet; Take 1 tablet (25 mg total) by mouth daily.  Dispense: 90 tablet; Refill: 1 - Continue to exercise and eat healthy  - Follow up as needed  2. Fatigue, unspecified type  - Vitamin D, 25-hydroxy   [name], NP

## 2016-08-18 LAB — BASIC METABOLIC PANEL
BUN: 20 mg/dL (ref 6–23)
CHLORIDE: 103 meq/L (ref 96–112)
CO2: 27 meq/L (ref 19–32)
Calcium: 9.4 mg/dL (ref 8.4–10.5)
Creatinine, Ser: 1.15 mg/dL (ref 0.40–1.20)
GFR: 65.47 mL/min (ref >60.00)
GLUCOSE: 85 mg/dL (ref 70–99)
POTASSIUM: 4.1 meq/L (ref 3.5–5.1)
Sodium: 137 mEq/L (ref 135–145)

## 2016-08-18 LAB — VITAMIN D 25 HYDROXY (VIT D DEFICIENCY, FRACTURES): VITD: 17.52 ng/mL — ABNORMAL LOW (ref 30.00–100.00)

## 2016-11-16 ENCOUNTER — Ambulatory Visit: Payer: BLUE CROSS/BLUE SHIELD | Admitting: Adult Health

## 2017-01-09 ENCOUNTER — Other Ambulatory Visit: Payer: Self-pay | Admitting: Family Medicine

## 2017-01-09 DIAGNOSIS — I10 Essential (primary) hypertension: Secondary | ICD-10-CM

## 2017-01-09 DIAGNOSIS — Z760 Encounter for issue of repeat prescription: Secondary | ICD-10-CM

## 2017-01-09 MED ORDER — LISINOPRIL 30 MG PO TABS: 30.0000 mg | ORAL_TABLET | Freq: Every day | ORAL | 1 refills | Status: DC

## 2017-02-25 ENCOUNTER — Emergency Department (HOSPITAL_COMMUNITY)
Admission: EM | Admit: 2017-02-25 | Discharge: 2017-02-25 | Disposition: A | Discharge status: Home / Self Care | Payer: BLUE CROSS/BLUE SHIELD | Attending: Emergency Medicine | Admitting: Emergency Medicine

## 2017-02-25 ENCOUNTER — Encounter (HOSPITAL_COMMUNITY): Payer: Self-pay | Admitting: *Deleted

## 2017-02-25 DIAGNOSIS — R55 Syncope and collapse: Secondary | ICD-10-CM

## 2017-02-25 DIAGNOSIS — Z79899 Other long term (current) drug therapy: Secondary | ICD-10-CM | POA: Diagnosis not present

## 2017-02-25 DIAGNOSIS — Z7982 Long term (current) use of aspirin: Secondary | ICD-10-CM | POA: Insufficient documentation

## 2017-02-25 LAB — CBC
HCT: 35 % — ABNORMAL LOW (ref 36.0–46.0)
Hemoglobin: 11.7 g/dL — ABNORMAL LOW (ref 12.0–15.0)
MCH: 29.7 pg (ref 26.0–34.0)
MCHC: 33.4 g/dL (ref 30.0–36.0)
MCV: 88.8 fL (ref 78.0–100.0)
PLATELETS: 242 10*3/uL (ref 150–400)
RBC: 3.94 MIL/uL (ref 3.87–5.11)
RDW: 13 % (ref 11.5–15.5)
WBC: 6.1 10*3/uL (ref 4.0–10.5)

## 2017-02-25 LAB — BASIC METABOLIC PANEL
Anion gap: 8 (ref 5–15)
BUN: 16 mg/dL (ref 6–20)
CHLORIDE: 104 mmol/L (ref 101–111)
CO2: 26 mmol/L (ref 22–32)
CREATININE: 1.15 mg/dL — AB (ref 0.44–1.00)
Calcium: 9.4 mg/dL (ref 8.9–10.3)
GFR calc non Af Amer: 56 mL/min — ABNORMAL LOW (ref >60)
Glucose, Bld: 89 mg/dL (ref 65–99)
Potassium: 3.4 mmol/L — ABNORMAL LOW (ref 3.5–5.1)
SODIUM: 138 mmol/L (ref 135–145)

## 2017-02-25 LAB — URINALYSIS, MICROSCOPIC (REFLEX): RBC / HPF: NONE SEEN RBC/hpf (ref 0–5)

## 2017-02-25 LAB — URINALYSIS, ROUTINE W REFLEX MICROSCOPIC
Bilirubin Urine: NEGATIVE
Glucose, UA: NEGATIVE mg/dL
Ketones, ur: NEGATIVE mg/dL
LEUKOCYTES UA: NEGATIVE
Nitrite: NEGATIVE
PROTEIN: NEGATIVE mg/dL
Specific Gravity, Urine: <1.005 — ABNORMAL LOW (ref 1.005–1.030)
pH: 6 (ref 5.0–8.0)

## 2017-02-25 LAB — I-STAT BETA HCG BLOOD, ED (MC, WL, AP ONLY)

## 2017-02-25 NOTE — Discharge Instructions (Signed)
We believe you lost consciousness due to a vasovagal episode. We cannot rule out an abnormal heart rhythm as these can come and go.  We would like for you to follow up with cardiology for a 30 day heart monitor to evaluate for any abnormal heart rhythms.  Please seek medical attention for severe symptoms.

## 2017-02-25 NOTE — ED Provider Notes (Signed)
[facility] DEPT Provider Note   CSN: [id number] Arrival date & time: 02/25/17  1034     History   Chief Complaint Chief Complaint  Patient presents with  . Loss of Consciousness    HPI [name] is a 46 y.o. female.  Patient with history of Essential HTN, Dysfunctional Uterine bleeding and obesity presents with following a syncopal episode this morning. This morning she sat up in bed and her L thigh began to cramp causing her 8/10 pain. She states that she tensed up with the pain and that is when she experienced her syncopal episode as she was laying back down. She experienced loss of consciousness for maybe a minute, she is uncertain, but not more than several minutes. When she awoke she was not confused. She felt dizzy and some nausea for the next 10-15 minutes, but was able to get up and go to the bathroom and return to her bed without repeat episode. She is asymptomatic at this time. She denies loss of bowl or bladder, shortness of breath, headache, palpitations.      Past Medical History:  Diagnosis Date  . Enlarged tonsils   . Herpes   . Hypertension   . Migraines   . Morbid obesity [facility])     Patient Active Problem List   Diagnosis Date Noted  . Morbid obesity ([place]) 11/19/2013  . DUB (dysfunctional uterine bleeding) 05/08/2013  . Fibroid uterus 03/01/2011  . HERPETIC VULVOVAGINITIS 04/30/2007  . HSV 04/30/2007  . CARPAL TUNNEL SYNDROME, RIGHT 04/30/2007  . Essential hypertension 04/30/2007  . HYPERTROPHY, TONSILS 04/30/2007  . LOW BACK PAIN SYNDROME 04/30/2007    Past Surgical History:  Procedure Laterality Date  . TUBAL LIGATION      OB History    Gravida Para Term Preterm AB Living   10 5 4 1 5 5    SAB TAB Ectopic Multiple Live Births   5               Home Medications    Prior to Admission medications   Medication Sig Start Date End Date Taking? Authorizing Provider  acyclovir (ZOVIRAX) 400 MG tablet Take 1 tablet (400 mg total)  by mouth 2 (two) times daily. 07/07/16  Yes Nafziger, Tommi Rumps, NP  aspirin EC 81 MG tablet Take 81 mg by mouth once.   Yes [provider]  hydrochlorothiazide (HYDRODIURIL) 25 MG tablet Take 1 tablet (25 mg total) by mouth daily. 08/17/16  Yes Nafziger, Tommi Rumps, NP  ibuprofen (ADVIL,MOTRIN) 200 MG tablet Take 600-800 mg by mouth every 6 (six) hours as needed for headache or mild pain.   Yes [provider]  lisinopril (PRINIVIL,ZESTRIL) 30 MG tablet Take 1 tablet (30 mg total) by mouth daily. 01/09/17  Yes Nafziger, Tommi Rumps, NP  HYDROcodone-homatropine (HYCODAN) 5-1.5 MG/5ML syrup Take 5 mLs by mouth every 8 (eight) hours as needed for cough. Patient not taking: Reported on 02/25/2017 07/06/16   [name], NP  ibuprofen (ADVIL,MOTRIN) 600 MG tablet Take 1 tablet (600 mg total) by mouth every 8 (eight) hours as needed. Patient not taking: Reported on 02/25/2017 07/14/16   [name], NP    Family History Family History  Problem Relation Age of Onset  . Heart attack Mother   . Hypertension Father   . Heart attack Father   . Hypertension Sister   . Hypertension Brother   . Hypertension Sister     Social History Social History  Substance Use Topics  . Smoking status: Never Smoker  .  Smokeless tobacco: Never Used  . Alcohol use No     Allergies   Patient has no known allergies.   Review of Systems Review of Systems  Constitutional: Negative for chills and fever.  HENT: Negative for ear pain and sore throat.   Eyes: Negative for pain and visual disturbance.  Respiratory: Negative for cough and shortness of breath.   Cardiovascular: Negative for chest pain and palpitations.  Gastrointestinal: Negative for abdominal pain and vomiting.  Genitourinary: Negative for dysuria and hematuria.  Musculoskeletal: Negative for arthralgias and back pain.  Skin: Negative for color change and rash.  Neurological: Negative for seizures and headaches.  All other systems reviewed  and are negative.    Physical Exam Updated Vital Signs BP (!) 149/99   Pulse 71   Temp 98.5 F (36.9 C) (Oral)   Resp 15   LMP 02/07/2017   SpO2 100%   Physical Exam  Constitutional: She is oriented to person, place, and time. She appears well-developed and well-nourished. No distress.  HENT:  Head: Normocephalic and atraumatic.  Eyes: Conjunctivae are normal.  Neck: Neck supple.  Cardiovascular: Normal rate and regular rhythm.   No murmur heard. Pulmonary/Chest: Effort normal and breath sounds normal. No respiratory distress.  Abdominal: Soft. There is no tenderness.  Musculoskeletal: She exhibits no edema or deformity.  Neurological: She is alert and oriented to person, place, and time.  Skin: Skin is warm and dry.  Psychiatric: She has a normal mood and affect.  Nursing note and vitals reviewed.    ED Treatments / Results  Labs (all labs ordered are listed, but only abnormal results are displayed) Labs Reviewed  BASIC METABOLIC PANEL - Abnormal; Notable for the following:       Result Value   Potassium 3.4 (*)    Creatinine, Ser 1.15 (*)    GFR calc non Af Amer 56 (*)    All other components within normal limits  CBC - Abnormal; Notable for the following:    Hemoglobin 11.7 (*)    HCT 35.0 (*)    All other components within normal limits  URINALYSIS, ROUTINE W REFLEX MICROSCOPIC - Abnormal; Notable for the following:    Specific Gravity, Urine <1.005 (*)    Hgb urine dipstick TRACE (*)    All other components within normal limits  URINALYSIS, MICROSCOPIC (REFLEX) - Abnormal; Notable for the following:    Bacteria, UA RARE (*)    Squamous Epithelial / LPF 0-5 (*)    All other components within normal limits  I-STAT BETA HCG BLOOD, ED ([facility], [facility], [facility] ONLY)    EKG  EKG Interpretation  Date/Time:  Sunday February 25 2017 10:40:00 EDT Ventricular Rate:  70 PR Interval:  154 QRS Duration: 88 QT Interval:  376 QTC Calculation: 406 R Axis:   31 Text  Interpretation:  Normal sinus rhythm Non-specific intra-ventricular conduction delay Abnormal ekg Confirmed by Carmin Muskrat [phone]) on 02/25/2017 11:42:25 AM       Radiology No results found.  Procedures Procedures (including critical care time)  Medications Ordered in ED Medications - No data to display   Initial Impression / Assessment and Plan / ED Course  I have reviewed the triage vital signs and the nursing notes.  Pertinent labs & imaging results that were available during my care of the patient were reviewed by me and considered in my medical decision making (see chart for details).    Patient presenting with syncopal episode following leg pain where she tensed  up preceding the episode.  - EKG: No arrythmia - CBC: Hgb stable at 11.7; no significant derrangments - BMP: K 3.4; No significant abnormalities - U/A: Spec. Grav: <1.005; Trace Hgb; otherwise NL - HCG: <5.0 (neg)  No hypotension, arrhythmia. Isolated episode with return to baseline. Suspect vasovagal following intense leg cramp. Cannot fully rule out arrhythmia will refer to cardiology for 30 day monitor.  Final Clinical Impressions(s) / ED Diagnoses   Final diagnoses:  Vasovagal syncope    New Prescriptions New Prescriptions   No medications on file     Neva Seat, MD 02/25/17 [place]    Carmin Muskrat, MD 02/25/17 [phone]

## 2017-02-25 NOTE — ED Triage Notes (Signed)
Pt reports having leg cramp last night followed by syncopal episode. Reports having dizziness since then. No acute distress is noted at triage.

## 2017-02-25 NOTE — ED Notes (Signed)
Patient is alert and orientedx4.  Patient was explained discharge instructions and they understood them with no questions.  The patient's husband, Suzetta Timko is taking patient home.

## 2017-02-28 ENCOUNTER — Ambulatory Visit (INDEPENDENT_AMBULATORY_CARE_PROVIDER_SITE_OTHER): Payer: BLUE CROSS/BLUE SHIELD | Admitting: Adult Health

## 2017-02-28 ENCOUNTER — Encounter: Payer: Self-pay | Admitting: Adult Health

## 2017-02-28 VITALS — BP 128/82 | Temp 98.3°F | Wt 248.0 lb

## 2017-02-28 DIAGNOSIS — Z111 Encounter for screening for respiratory tuberculosis: Secondary | ICD-10-CM

## 2017-02-28 DIAGNOSIS — G43819 Other migraine, intractable, without status migrainosus: Secondary | ICD-10-CM

## 2017-02-28 DIAGNOSIS — R55 Syncope and collapse: Secondary | ICD-10-CM | POA: Diagnosis not present

## 2017-02-28 DIAGNOSIS — I10 Essential (primary) hypertension: Secondary | ICD-10-CM | POA: Diagnosis not present

## 2017-02-28 MED ORDER — ATENOLOL 25 MG PO TABS: 25.0000 mg | ORAL_TABLET | Freq: Every day | ORAL | 1 refills | Status: DC

## 2017-02-28 NOTE — Progress Notes (Signed)
Subjective:    Patient ID: Janet Sanchez, female    DOB: 07/30/1970, 46 y.o.   MRN: [id number]  HPI  46 year old who  has a past medical history of Enlarged tonsils; Herpes; Hypertension; Migraines; and Morbid obesity ([place]). She presents to the office today for follow up after being seen in the ER 4 days ago for syncopal episode. Per ER note:   This morning she sat up in bed and her L thigh began to cramp causing her 8/10 pain. She states that she tensed up with the pain and that is when she experienced her syncopal episode as she was laying back down. She experienced loss of consciousness for maybe a minute, she is uncertain, but not more than several minutes. When she awoke she was not confused. She felt dizzy and some nausea for the next 10-15 minutes, but was able to get up and go to the bathroom and return to her bed without repeat episode. She is asymptomatic at this time. She denies loss of bowl or bladder, shortness of breath, headache, palpitations.  Labs were unremarkable except for a mildly decreased GFR and increased Cr. EKG showed NSR with septal infarct  with a rate of 70.   The ER referred her to cardiology for 30 day event monitor. She has not called for her appointment yet   Today in the office she reports that she has not had any syncopal episodes since being discharged. She denies any SOB or CP  She also is complaining of a constant migraine  headache that has been apparent for multiple months intermittently. Migraines have been worse over the last two weeks. She has a history of migraines and reports that this is what her migraines in the past have felt like.   She needs a PPD for work   Review of Systems See HPI   Past Medical History:  Diagnosis Date  . Enlarged tonsils   . Herpes   . Hypertension   . Migraines   . Morbid obesity ([place])     Social History   Social History  . Marital status: Married    Spouse name: N/A  . Number of children: N/A  .  Years of education: N/A   Occupational History  . Not on file.   Social History Main Topics  . Smoking status: Never Smoker  . Smokeless tobacco: Never Used  . Alcohol use No  . Drug use: No  . Sexual activity: Yes    Birth control/ protection: Surgical   Other Topics Concern  . 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 TUBAL LIGATION      Family History  Problem Relation Age of Onset  . Heart attack Mother   . Hypertension Father   . Heart attack Father   . Hypertension Sister   . Hypertension Brother   . Hypertension Sister     No Known Allergies  Current Outpatient Prescriptions on File Prior to Visit  Medication Sig Dispense Refill  . acyclovir (ZOVIRAX) 400 MG tablet Take 1 tablet (400 mg total) by mouth 2 (two) times daily. 180 tablet 3  . aspirin EC 81 MG tablet Take 81 mg by mouth once.    . hydrochlorothiazide (HYDRODIURIL) 25 MG tablet Take 1  tablet (25 mg total) by mouth daily. 90 tablet 1  . ibuprofen (ADVIL,MOTRIN) 200 MG tablet Take 600-800 mg by mouth every 6 (six) hours as needed for headache or mild pain.    Marland Kitchen HYDROcodone-homatropine (HYCODAN) 5-1.5 MG/5ML syrup Take 5 mLs by mouth every 8 (eight) hours as needed for cough. (Patient not taking: Reported on 02/25/2017) 120 mL 0   No current facility-administered medications on file prior to visit.     BP 128/82 (BP Location: Left Arm)   Temp 98.3 F (36.8 C) (Oral)   Wt 248 lb (112.5 kg)   LMP 02/07/2017   BMI 42.57 kg/m       Objective:   Physical Exam  Constitutional: She is oriented to person, place, and time. She appears well-developed and well-nourished. No distress.  Eyes: Pupils are equal, round, and reactive to light. Conjunctivae and EOM are normal. Right eye exhibits no discharge. Left eye exhibits no discharge. No scleral  icterus.  Cardiovascular: Normal rate, regular rhythm, normal heart sounds and intact distal pulses.  Exam reveals no gallop and no friction rub.   No murmur heard. Pulmonary/Chest: Effort normal and breath sounds normal. No respiratory distress. She has no wheezes. She has no rales. She exhibits no tenderness.  Neurological: She is alert and oriented to person, place, and time.  Skin: Skin is warm and dry. No rash noted. She is not diaphoretic. No erythema. No pallor.  Psychiatric: She has a normal mood and affect. Her behavior is normal. Judgment and thought content normal.  Vitals reviewed.     Assessment & Plan:  1. Essential hypertension - Will d/c lisinopril and start on Atenolol.  - Follow up in 2 weeks  - atenolol (TENORMIN) 25 MG tablet; Take 1 tablet (25 mg total) by mouth daily.  Dispense: 30 tablet; Refill: 1  2. Other migraine without status migrainosus, intractable - atenolol (TENORMIN) 25 MG tablet; Take 1 tablet (25 mg total) by mouth daily.  Dispense: 30 tablet; Refill: 1 - Will check with her in 2 weeks at follow up to see if migraines have improved.   3. Vasovagal syncope - Likely not due to arrhythmia but will refer to cardiology  - Ambulatory referral to Cardiology   [name], NP

## 2017-02-28 NOTE — Addendum Note (Signed)
Addended by: Miles Costain T on: 02/28/2017 08:16 AM   Modules accepted: Orders

## 2017-02-28 NOTE — Patient Instructions (Signed)
It was great seeing you today today  I have changed your blood pressure medication to Atenolol to help with migraines as well.   Please follow up on Friday to have your PPD read and then in two weeks to check your blood pressure

## 2017-03-02 LAB — TB SKIN TEST
INDURATION: 0 mm
TB Skin Test: NEGATIVE

## 2017-03-14 ENCOUNTER — Ambulatory Visit: Payer: BLUE CROSS/BLUE SHIELD | Admitting: Adult Health

## 2017-03-20 ENCOUNTER — Ambulatory Visit (INDEPENDENT_AMBULATORY_CARE_PROVIDER_SITE_OTHER): Payer: BLUE CROSS/BLUE SHIELD | Admitting: Adult Health

## 2017-03-20 VITALS — BP 136/86 | Temp 98.0°F | Wt 249.0 lb

## 2017-03-20 DIAGNOSIS — G43819 Other migraine, intractable, without status migrainosus: Secondary | ICD-10-CM | POA: Diagnosis not present

## 2017-03-20 DIAGNOSIS — I10 Essential (primary) hypertension: Secondary | ICD-10-CM

## 2017-03-20 DIAGNOSIS — Z760 Encounter for issue of repeat prescription: Secondary | ICD-10-CM

## 2017-03-20 LAB — BASIC METABOLIC PANEL
BUN: 15 mg/dL (ref 6–23)
CALCIUM: 9.5 mg/dL (ref 8.4–10.5)
CO2: 34 mEq/L — ABNORMAL HIGH (ref 19–32)
CREATININE: 1.07 mg/dL (ref 0.40–1.20)
Chloride: 100 mEq/L (ref 96–112)
GFR: 70.97 mL/min (ref >60.00)
Glucose, Bld: 81 mg/dL (ref 70–99)
POTASSIUM: 3.8 meq/L (ref 3.5–5.1)
Sodium: 137 mEq/L (ref 135–145)

## 2017-03-20 MED ORDER — ATENOLOL 50 MG PO TABS: 50.0000 mg | ORAL_TABLET | Freq: Every day | ORAL | 3 refills | Status: DC

## 2017-03-20 MED ORDER — ACYCLOVIR 400 MG PO TABS: 400.0000 mg | ORAL_TABLET | Freq: Two times a day (BID) | ORAL | 3 refills | Status: DC

## 2017-03-20 NOTE — Progress Notes (Signed)
Subjective:    Patient ID: Janet Sanchez, female    DOB: 1970-12-22, 46 y.o.   MRN: [id number]  HPI   46 year old female who  has a past medical history of Enlarged tonsils; Herpes; Hypertension; Migraines; and Morbid obesity ([place]). She presents to the office today for two week follow up due to change in blood pressure medications. During her last visit I changed her blood pressure medication from lisinopril to Atenolol in the hopes that this would help with her chronic migraines as well. Today in the office she reports that he migraines have become less frequent and less intense. She still has to continue to take motrin as needed when she has a migraine   She denies any side effects of Atenolol   BP Readings from Last 3 Encounters:  03/20/17 136/86  02/28/17 128/82  02/25/17 138/90     Review of Systems See HPI   Past Medical History:  Diagnosis Date  . Enlarged tonsils   . Herpes   . Hypertension   . Migraines   . Morbid obesity ([place])     Social History   Social History  . Marital status: Married    Spouse name: N/A  . Number of children: N/A  . Years of education: N/A   Occupational History  . Not on file.   Social History Main Topics  . Smoking status: Never Smoker  . Smokeless tobacco: Never Used  . Alcohol use No  . Drug use: No  . Sexual activity: Yes    Birth control/ protection: Surgical   Other Topics Concern  . 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 TUBAL LIGATION      Family History  Problem Relation Age of Onset  . Heart attack Mother   . Hypertension Father   . Heart attack Father   . Hypertension Sister   . Hypertension Brother   . Hypertension Sister     No Known Allergies  Current Outpatient Prescriptions on File Prior to Visit  Medication Sig Dispense Refill  .  acyclovir (ZOVIRAX) 400 MG tablet Take 1 tablet (400 mg total) by mouth 2 (two) times daily. 180 tablet 3  . aspirin EC 81 MG tablet Take 81 mg by mouth once.    Marland Kitchen atenolol (TENORMIN) 25 MG tablet Take 1 tablet (25 mg total) by mouth daily. 30 tablet 1  . hydrochlorothiazide (HYDRODIURIL) 25 MG tablet Take 1 tablet (25 mg total) by mouth daily. 90 tablet 1  . ibuprofen (ADVIL,MOTRIN) 200 MG tablet Take 600-800 mg by mouth every 6 (six) hours as needed for headache or mild pain.    Marland Kitchen HYDROcodone-homatropine (HYCODAN) 5-1.5 MG/5ML syrup Take 5 mLs by mouth every 8 (eight) hours as needed for cough. (Patient not taking: Reported on 03/20/2017) 120 mL 0   No current facility-administered medications on file prior to visit.     BP 136/86 (BP Location: Left Arm)   Temp 98 F (36.7 C) (Oral)   Wt 249 lb (112.9 kg)   BMI 42.74 kg/m       Objective:   Physical Exam  Constitutional: She is oriented to person, place, and time. She appears well-developed and well-nourished. No distress.  Cardiovascular: Normal rate, regular rhythm, normal heart sounds and  intact distal pulses.  Exam reveals no gallop and no friction rub.   No murmur heard. Pulmonary/Chest: Effort normal and breath sounds normal. No respiratory distress. She has no wheezes. She has no rales. She exhibits no tenderness.  Neurological: She is alert and oriented to person, place, and time.  Skin: Skin is warm and dry. No rash noted. No erythema. No pallor.  Psychiatric: She has a normal mood and affect. Her behavior is normal. Judgment and thought content normal.  Nursing note and vitals reviewed.     Assessment & Plan:  1. Essential hypertension - Will increase Atenolol to 50 mg. This should help with migraines as well  - Basic Metabolic Panel - atenolol (TENORMIN) 50 MG tablet; Take 1 tablet (50 mg total) by mouth daily.  Dispense: 90 tablet; Refill: 3  2. Medication refill  - acyclovir (ZOVIRAX) 400 MG tablet; Take 1 tablet  (400 mg total) by mouth 2 (two) times daily.  Dispense: 180 tablet; Refill: 3  3. Other migraine without status migrainosus, intractable  - atenolol (TENORMIN) 50 MG tablet; Take 1 tablet (50 mg total) by mouth daily.  Dispense: 90 tablet; Refill: 3   [name], NP

## 2017-03-28 ENCOUNTER — Other Ambulatory Visit: Payer: Self-pay | Admitting: Adult Health

## 2017-03-28 NOTE — Telephone Encounter (Signed)
Denied.  Message sent to the pharmacy to have pt contact office if refills are needed of these medications.

## 2017-04-16 ENCOUNTER — Encounter: Payer: Self-pay | Admitting: Cardiology

## 2017-04-16 ENCOUNTER — Ambulatory Visit (INDEPENDENT_AMBULATORY_CARE_PROVIDER_SITE_OTHER): Payer: BLUE CROSS/BLUE SHIELD | Admitting: Cardiology

## 2017-04-16 VITALS — BP 140/90 | HR 60 | Ht 63.0 in | Wt 246.0 lb

## 2017-04-16 DIAGNOSIS — R55 Syncope and collapse: Secondary | ICD-10-CM | POA: Diagnosis not present

## 2017-04-16 DIAGNOSIS — I10 Essential (primary) hypertension: Secondary | ICD-10-CM | POA: Diagnosis not present

## 2017-04-16 DIAGNOSIS — Z8249 Family history of ischemic heart disease and other diseases of the circulatory system: Secondary | ICD-10-CM | POA: Diagnosis not present

## 2017-04-16 NOTE — Patient Instructions (Signed)
Medication Instructions:  Your physician recommends that you continue on your current medications as directed. Please refer to the Current Medication list given to you today.   Labwork: None ordered  Testing/Procedures: Your physician has requested that you have an echocardiogram. Echocardiography is a painless test that uses sound waves to create images of your heart. It provides your doctor with information about the size and shape of your heart and how well your heart's chambers and valves are working. This procedure takes approximately one hour. There are no restrictions for this procedure.  Your physician has requested that you have an exercise tolerance test. For further information please visit [email or web address]. Please also follow instruction sheet, as given.   Follow-Up: Your physician wants you to follow-up in: 6 months with Dr. Radford Pax. You will receive a reminder letter in the mail two months in advance. If you don't receive a letter, please call our office to schedule the follow-up appointment.   Any Other Special Instructions Will Be Listed Below (If Applicable).     If you need a refill on your cardiac medications before your next appointment, please call your pharmacy.

## 2017-04-16 NOTE — Progress Notes (Signed)
Cardiology Office Note:    Date:  04/16/2017   ID:  Janet Sanchez, DOB [birth year], MRN [id number]  PCP:  [name], NP  Cardiologist:  Fransico Him, MD   Referring MD: [name], NP   Chief Complaint  Patient presents with  . New Patient (Initial Visit)    syncope    History of Present Illness:    Janet Sanchez is a 46 y.o. female with a hx of HTN and Obesity who presents today for evaluation of syncope.  She has never had a syncopal episode before and denies any family history of sudden cardiac death or arrhythmia in the past.  She says that she was laying in bed and all of a sudden got a very severe sharp cramp in her thigh that was unbearable.  She sat up and tried to rub her leg but then became diaphoretic and nauseated and her eyes rolled back in her head and she passed out in her bed.  This was witnessed by her daughter. She was only out a brief second. She thinks her heart started racing when she started having pain.  There was no seizure activity witnessed and no incontinence or tongue biting.  She denies any prior history of dizziness, palpitations or syncope.  She denies any chest pain or pressure, SOB, DOE, PND, orthopnea, LE edema.  She does have a family history of CAD a an early age.  Her mom died of an MI at 7 and her dad died of an MI at 21.    Past Medical History:  Diagnosis Date  . Enlarged tonsils   . Herpes   . Hypertension   . Migraines   . Morbid obesity ([place])     Past Surgical History:  Procedure Laterality Date  . TUBAL LIGATION      Current Medications: Current Meds  Medication Sig  . acyclovir (ZOVIRAX) 400 MG tablet Take 1 tablet (400 mg total) by mouth 2 (two) times daily.  Marland Kitchen atenolol (TENORMIN) 50 MG tablet Take 1 tablet (50 mg total) by mouth daily.  . hydrochlorothiazide (HYDRODIURIL) 25 MG tablet Take 1 tablet (25 mg total) by mouth daily.  Marland Kitchen ibuprofen (ADVIL,MOTRIN) 200 MG tablet Take 600-800 mg by mouth every 6 (six)  hours as needed for headache or mild pain.     Allergies:   Patient has no known allergies.   Social History   Social History  . Marital status: Married    Spouse name: N/A  . Number of children: N/A  . Years of education: N/A   Social History Main Topics  . Smoking status: Never Smoker  . Smokeless tobacco: Never Used  . Alcohol use No  . Drug use: No  . Sexual activity: Yes    Birth control/ protection: Surgical   Other Topics Concern  . 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  ROS:   Please see the history of present illness.    ROS  All other systems reviewed and negative.   EKGs/Labs/Other Studies Reviewed:    The following studies were reviewed today: Office notes from PCP  EKG:  EKG is not ordered today.  The ekg from PCP office demonstrates NSR with septal infarct and no ST changes, normal intervals.   Recent Labs: 07/11/2016: ALT 21; TSH 1.91 02/25/2017: Hemoglobin 11.7; Platelets 242 03/20/2017: BUN 15; Creatinine, Ser 1.07; Potassium 3.8; Sodium 137   Recent Lipid Panel    Component Value Date/Time   CHOL 190 07/11/2016 0803   TRIG 47.0 07/11/2016 0803   HDL 61.60 07/11/2016 0803   CHOLHDL 3 07/11/2016 0803   VLDL 9.4 07/11/2016 0803   LDLCALC 119 (H) 07/11/2016 0803    Physical Exam:    VS:  BP 140/90   Pulse 60   Ht 5\' 3"  (1.6 m)   Wt 246 lb (111.6 kg)   BMI 43.58 kg/m     Wt Readings from Last 3 Encounters:  04/16/17 246 lb (111.6 kg)  03/20/17 249 lb (112.9 kg)  02/28/17 248 lb (112.5 kg)     GEN:  Well nourished, well developed in no acute distress HEENT: Normal NECK: No JVD; No carotid  bruits LYMPHATICS: No lymphadenopathy CARDIAC: RRR, no murmurs, rubs, gallops RESPIRATORY:  Clear to auscultation without rales, wheezing or rhonchi  ABDOMEN: Soft, non-tender, non-distended MUSCULOSKELETAL:  No edema; No deformity  SKIN: Warm and dry NEUROLOGIC:  Alert and oriented x 3 PSYCHIATRIC:  Normal affect   ASSESSMENT:    1. Syncope, unspecified syncope type   2. Family history of premature CAD   3. Essential hypertension    PLAN:    In order of problems listed above:  1.  Syncope - presentation is consistent with vasovagal syncope triggered by severe leg cramps.  She has a normal EKG other than septal infarct which is likely related to lead placement.  No family history of SCD.  Does not sound like seizure activity.  Recommend 2D echo to make sure there is no structural heart disease.  She does have a family history of premature CAD in both parents so I recommend getting a baseline ETT to rule out ischemia.  I do not think we need to get a monitor at this time as her sx are c/w vasovagal syncope.  If she has recurrent syncope with no defining trigger then will need heart monitor.    2.  Family history of CAD - she has not symptoms of angina.  I will get a screening ETT.  We discussed coronary calcium scoring but she wants to hold off due to out of pocket cost.   3.  HTN - her BP is well controlled on exam today. She will continue on Atenolol 50mg  daily which will also help with vasovagal syncope.  She will continue with diuretic as well.    Medication Adjustments/Labs and Tests Ordered: Current medicines are reviewed at length with the patient today.  Concerns regarding medicines are outlined above.  Orders Placed This Encounter  Procedures  . Exercise Tolerance Test  . ECHOCARDIOGRAM COMPLETE   No orders of the defined types were placed in this encounter.   Signed, Fransico Him, MD  04/16/2017 9:47 AM    [place]

## 2017-05-01 ENCOUNTER — Ambulatory Visit (INDEPENDENT_AMBULATORY_CARE_PROVIDER_SITE_OTHER): Payer: BLUE CROSS/BLUE SHIELD

## 2017-05-01 ENCOUNTER — Other Ambulatory Visit: Payer: Self-pay

## 2017-05-01 ENCOUNTER — Encounter (INDEPENDENT_AMBULATORY_CARE_PROVIDER_SITE_OTHER): Payer: Self-pay

## 2017-05-01 ENCOUNTER — Ambulatory Visit (HOSPITAL_COMMUNITY): Payer: BLUE CROSS/BLUE SHIELD | Attending: Internal Medicine

## 2017-05-01 DIAGNOSIS — Z8249 Family history of ischemic heart disease and other diseases of the circulatory system: Secondary | ICD-10-CM | POA: Diagnosis not present

## 2017-05-01 DIAGNOSIS — I5030 Unspecified diastolic (congestive) heart failure: Secondary | ICD-10-CM | POA: Diagnosis not present

## 2017-05-01 DIAGNOSIS — R55 Syncope and collapse: Secondary | ICD-10-CM

## 2017-05-01 LAB — ECHOCARDIOGRAM COMPLETE
AVPHT: 862 ms
Area-P 1/2: 3.49 cm2
CHL CUP TV REG PEAK VELOCITY: 211 cm/s
E decel time: 215 msec
EERAT: 6.46
FS: 34 % (ref 28–44)
IVS/LV PW RATIO, ED: 0.62
LA ID, A-P, ES: 36 mm
LA diam end sys: 36 mm
LADIAMINDEX: 1.57 cm/m2
LAVOL: 61.1 mL
LAVOLA4C: 54.4 mL
LAVOLIN: 26.7 mL/m2
LV TDI E'LATERAL: 8.27
LV TDI E'MEDIAL: 9.14
LV e' LATERAL: 8.27 cm/s
LVEEAVG: 6.46
LVEEMED: 6.46
LVOT VTI: 17.9 cm
LVOT area: 3.14 cm2
LVOT peak vel: 87.9 cm/s
LVOTD: 20 mm
LVOTSV: 56 mL
Lateral S' vel: 14.6 cm/s
MV Dec: 215
MV pk A vel: 54.3 m/s
MVPKEVEL: 53.4 m/s
P 1/2 time: 63 ms
PW: 13 mm — AB (ref 0.6–1.1)
RV sys press: 21 mmHg
TAPSE: 26.7 mm
TR max vel: 211 cm/s
TVPG: 211 mmHg

## 2017-05-02 LAB — EXERCISE TOLERANCE TEST
CHL CUP MPHR: 173 {beats}/min
CHL CUP RESTING HR STRESS: 70 {beats}/min
CHL RATE OF PERCEIVED EXERTION: 17
CSEPEDS: 0 s
Estimated workload: 6.9 METS
Exercise duration (min): 5 min
Peak HR: 169 {beats}/min
Percent HR: 97 %

## 2017-05-07 ENCOUNTER — Telehealth: Payer: Self-pay

## 2017-05-07 NOTE — Telephone Encounter (Signed)
spoke with patient about stress test result. patient verbalized understanding. patient thanked me for my call.

## 2017-05-22 ENCOUNTER — Other Ambulatory Visit: Payer: Self-pay

## 2017-05-22 ENCOUNTER — Emergency Department (HOSPITAL_COMMUNITY): Payer: BLUE CROSS/BLUE SHIELD

## 2017-05-22 ENCOUNTER — Emergency Department (HOSPITAL_COMMUNITY)
Admission: EM | Admit: 2017-05-22 | Discharge: 2017-05-22 | Disposition: A | Discharge status: Home / Self Care | Payer: BLUE CROSS/BLUE SHIELD | Attending: Emergency Medicine | Admitting: Emergency Medicine

## 2017-05-22 ENCOUNTER — Encounter (HOSPITAL_COMMUNITY): Payer: Self-pay

## 2017-05-22 DIAGNOSIS — R072 Precordial pain: Secondary | ICD-10-CM | POA: Insufficient documentation

## 2017-05-22 DIAGNOSIS — R079 Chest pain, unspecified: Secondary | ICD-10-CM | POA: Diagnosis present

## 2017-05-22 DIAGNOSIS — R1013 Epigastric pain: Secondary | ICD-10-CM | POA: Diagnosis not present

## 2017-05-22 DIAGNOSIS — Z79899 Other long term (current) drug therapy: Secondary | ICD-10-CM | POA: Diagnosis not present

## 2017-05-22 DIAGNOSIS — I10 Essential (primary) hypertension: Secondary | ICD-10-CM | POA: Insufficient documentation

## 2017-05-22 LAB — URINALYSIS, ROUTINE W REFLEX MICROSCOPIC
Bacteria, UA: NONE SEEN
Bilirubin Urine: NEGATIVE
GLUCOSE, UA: NEGATIVE mg/dL
Hgb urine dipstick: NEGATIVE
KETONES UR: NEGATIVE mg/dL
Nitrite: NEGATIVE
Protein, ur: NEGATIVE mg/dL
Specific Gravity, Urine: 1.004 — ABNORMAL LOW (ref 1.005–1.030)
pH: 6 (ref 5.0–8.0)

## 2017-05-22 LAB — POC URINE PREG, ED: Preg Test, Ur: NEGATIVE

## 2017-05-22 LAB — BASIC METABOLIC PANEL
ANION GAP: 8 (ref 5–15)
BUN: 24 mg/dL — AB (ref 6–20)
CHLORIDE: 101 mmol/L (ref 101–111)
CO2: 27 mmol/L (ref 22–32)
Calcium: 9.4 mg/dL (ref 8.9–10.3)
Creatinine, Ser: 1.2 mg/dL — ABNORMAL HIGH (ref 0.44–1.00)
GFR calc Af Amer: >60 mL/min (ref >60)
GFR, EST NON AFRICAN AMERICAN: 53 mL/min — AB (ref >60)
GLUCOSE: 103 mg/dL — AB (ref 65–99)
POTASSIUM: 3.6 mmol/L (ref 3.5–5.1)
Sodium: 136 mmol/L (ref 135–145)

## 2017-05-22 LAB — HEPATIC FUNCTION PANEL
ALBUMIN: 3.6 g/dL (ref 3.5–5.0)
ALT: 21 U/L (ref 14–54)
AST: 22 U/L (ref 15–41)
Alkaline Phosphatase: 71 U/L (ref 38–126)
BILIRUBIN TOTAL: 0.5 mg/dL (ref 0.3–1.2)
Total Protein: 7.6 g/dL (ref 6.5–8.1)

## 2017-05-22 LAB — LIPASE, BLOOD: Lipase: 41 U/L (ref 11–51)

## 2017-05-22 LAB — CBC
HEMATOCRIT: 37 % (ref 36.0–46.0)
HEMOGLOBIN: 12.4 g/dL (ref 12.0–15.0)
MCH: 29.9 pg (ref 26.0–34.0)
MCHC: 33.5 g/dL (ref 30.0–36.0)
MCV: 89.2 fL (ref 78.0–100.0)
Platelets: 259 10*3/uL (ref 150–400)
RBC: 4.15 MIL/uL (ref 3.87–5.11)
RDW: 13.1 % (ref 11.5–15.5)
WBC: 6 10*3/uL (ref 4.0–10.5)

## 2017-05-22 LAB — I-STAT TROPONIN, ED
TROPONIN I, POC: 0 ng/mL (ref 0.00–0.08)
Troponin i, poc: 0 ng/mL (ref 0.00–0.08)

## 2017-05-22 MED ORDER — OMEPRAZOLE 20 MG PO CPDR: 20.0000 mg | DELAYED_RELEASE_CAPSULE | Freq: Every day | ORAL | 0 refills | Status: DC

## 2017-05-22 MED ORDER — GI COCKTAIL ~~LOC~~
30.0000 mL | Freq: Once | ORAL | Status: AC
  Administered 2017-05-22: 30 mL via ORAL
  Filled 2017-05-22: qty 30

## 2017-05-22 NOTE — ED Notes (Signed)
Pt state she understands instructions. Home stable with steady gait. With husband.

## 2017-05-22 NOTE — Discharge Instructions (Signed)
Your workup today did not show evidence of a cardiac cause of your symptoms.  As you improved with the GI cocktail, I suspect it is related to your GI tract.  Please start the new medication and follow-up with your primary care physician in the next several days.  If any symptoms change or worsen, please return immediately to the nearest emergency department.

## 2017-05-22 NOTE — ED Provider Notes (Signed)
[facility] EMERGENCY DEPARTMENT Provider Note   CSN: [id number] Arrival date & time: 05/22/17  0234     History   Chief Complaint Chief Complaint  Patient presents with  . Chest Pain  . Abdominal Pain    HPI Janet Sanchez is a 46 y.o. female.  The history is provided by the patient, the spouse and medical records. No language interpreter was used.  Abdominal Pain   This is a new problem. The current episode started 12 to 24 hours ago. The problem occurs constantly. The problem has been resolved. The pain is associated with suspicious food intake. The pain is located in the epigastric region and periumbilical region. The quality of the pain is cramping and aching. The pain is at a severity of 6/10. The patient is experiencing no pain. Associated symptoms include nausea. Pertinent negatives include fever, belching, diarrhea, melena, vomiting, constipation, dysuria, frequency and headaches. The symptoms are aggravated by palpation. Nothing relieves the symptoms.  Chest Pain   Associated symptoms include abdominal pain and nausea. Pertinent negatives include no back pain, no cough, no diaphoresis, no dizziness, no exertional chest pressure, no fever, no headaches, no leg pain, no lower extremity edema, no palpitations, no shortness of breath, no sputum production, no vomiting and no weakness. She has tried nothing for the symptoms. The treatment provided no relief.  Her past medical history is significant for hypertension.  Pertinent negatives for past medical history include no COPD, no CHF and no diabetes.  Her family medical history is significant for early MI.    Past Medical History:  Diagnosis Date  . Enlarged tonsils   . Herpes   . Hypertension   . Migraines   . Morbid obesity [facility])     Patient Active Problem List   Diagnosis Date Noted  . Morbid obesity ([place]) 11/19/2013  . DUB (dysfunctional uterine bleeding) 05/08/2013  . Fibroid uterus  03/01/2011  . HERPETIC VULVOVAGINITIS 04/30/2007  . HSV 04/30/2007  . CARPAL TUNNEL SYNDROME, RIGHT 04/30/2007  . Essential hypertension 04/30/2007  . HYPERTROPHY, TONSILS 04/30/2007  . LOW BACK PAIN SYNDROME 04/30/2007    Past Surgical History:  Procedure Laterality Date  . TUBAL LIGATION      OB History    Gravida Para Term Preterm AB Living   10 5 4 1 5 5    SAB TAB Ectopic Multiple Live Births   5               Home Medications    Prior to Admission medications   Medication Sig Start Date End Date Taking? Authorizing Provider  acyclovir (ZOVIRAX) 400 MG tablet Take 1 tablet (400 mg total) by mouth 2 (two) times daily. 03/20/17   Nafziger, Tommi Rumps, NP  atenolol (TENORMIN) 50 MG tablet Take 1 tablet (50 mg total) by mouth daily. 03/20/17   Nafziger, Tommi Rumps, NP  hydrochlorothiazide (HYDRODIURIL) 25 MG tablet Take 1 tablet (25 mg total) by mouth daily. 08/17/16   Nafziger, Tommi Rumps, NP  ibuprofen (ADVIL,MOTRIN) 200 MG tablet Take 600-800 mg by mouth every 6 (six) hours as needed for headache or mild pain.    [provider]    Family History Family History  Problem Relation Age of Onset  . Heart attack Mother 34  . Hypertension Father   . Heart attack Father 9  . Hypertension Sister   . Hypertension Brother   . Arrhythmia Brother   . Hypertension Sister     Social History Social History  Substance  Use Topics  . Smoking status: Never Smoker  . Smokeless tobacco: Never Used  . Alcohol use No     Allergies   Patient has no known allergies.   Review of Systems Review of Systems  Constitutional: Negative for chills, diaphoresis, fatigue and fever.  HENT: Negative for congestion.   Eyes: Negative for visual disturbance.  Respiratory: Positive for chest tightness. Negative for cough, sputum production, shortness of breath, wheezing and stridor.   Cardiovascular: Positive for chest pain. Negative for palpitations.  Gastrointestinal: Positive for abdominal pain  and nausea. Negative for constipation, diarrhea, melena and vomiting.  Genitourinary: Negative for dysuria, flank pain, frequency, pelvic pain, vaginal bleeding, vaginal discharge and vaginal pain.  Musculoskeletal: Negative for back pain, neck pain and neck stiffness.  Skin: Negative for rash and wound.  Neurological: Negative for dizziness, syncope, weakness, light-headedness and headaches.  Psychiatric/Behavioral: Negative for agitation.  All other systems reviewed and are negative.    Physical Exam Updated Vital Signs BP 119/76 (BP Location: Left Arm)   Pulse 75   Temp 98.8 F (37.1 C) (Oral)   Resp 16   LMP 03/26/2017 (Approximate) Comment: tubes tied  SpO2 99%   Physical Exam  Constitutional: She is oriented to person, place, and time. She appears well-developed and well-nourished. No distress.  HENT:  Head: Normocephalic and atraumatic.  Mouth/Throat: Oropharynx is clear and moist. No oropharyngeal exudate.  Eyes: Pupils are equal, round, and reactive to light. Conjunctivae and EOM are normal.  Neck: Normal range of motion. Neck supple.  Cardiovascular: Normal rate, regular rhythm, normal heart sounds and intact distal pulses.   No murmur heard. Pulmonary/Chest: Effort normal and breath sounds normal. No stridor. No respiratory distress. She has no wheezes. She exhibits no tenderness.  Abdominal: Soft. Normal appearance. There is tenderness in the epigastric area. There is no rigidity, no rebound, no guarding and no CVA tenderness.    Musculoskeletal: She exhibits no edema.  Neurological: She is alert and oriented to person, place, and time. No cranial nerve deficit or sensory deficit. She exhibits normal muscle tone.  Skin: Skin is warm and dry. Capillary refill takes less than 2 seconds. No rash noted. She is not diaphoretic. No erythema.  Psychiatric: She has a normal mood and affect.  Nursing note and vitals reviewed.    ED Treatments / Results  Labs (all labs  ordered are listed, but only abnormal results are displayed) Labs Reviewed  BASIC METABOLIC PANEL - Abnormal; Notable for the following:       Result Value   Glucose, Bld 103 (*)    BUN 24 (*)    Creatinine, Ser 1.20 (*)    GFR calc non Af Amer 53 (*)    All other components within normal limits  URINALYSIS, ROUTINE W REFLEX MICROSCOPIC - Abnormal; Notable for the following:    Color, Urine STRAW (*)    Specific Gravity, Urine 1.004 (*)    Leukocytes, UA SMALL (*)    Squamous Epithelial / LPF 0-5 (*)    All other components within normal limits  HEPATIC FUNCTION PANEL - Abnormal; Notable for the following:    Bilirubin, Direct <0.1 (*)    All other components within normal limits  CBC  LIPASE, BLOOD  I-STAT TROPONIN, ED  POC URINE PREG, ED  I-STAT TROPONIN, ED    EKG  EKG Interpretation  Date/Time:  Tuesday May 22 2017 11:18:14 EDT Ventricular Rate:  67 PR Interval:    QRS Duration: 95 QT Interval:  386 QTC Calculation: 408 R Axis:   1 Text Interpretation:  Sinus rhythm Probable anteroseptal infarct, old When compared to prior, no significant changes seen.  No STEMI Confirmed by Antony Blackbird [id number]) on 05/22/2017 11:21:45 AM       Radiology Dg Chest 2 View  Result Date: 05/22/2017 CLINICAL DATA:  46 year old female with chest pain and shortness of breath. EXAM: CHEST  2 VIEW COMPARISON:  None. FINDINGS: The heart size and mediastinal contours are within normal limits. Both lungs are clear. The visualized skeletal structures are unremarkable. IMPRESSION: No active cardiopulmonary disease. Electronically Signed   By: Anner Crete M.D.   On: 05/22/2017 03:31    Procedures Procedures (including critical care time)  Medications Ordered in ED Medications  gi cocktail (Maalox,Lidocaine,Donnatal) (30 mLs Oral Given 05/22/17 1115)     Initial Impression / Assessment and Plan / ED Course  I have reviewed the triage vital signs and the nursing  notes.  Pertinent labs & imaging results that were available during my care of the patient were reviewed by me and considered in my medical decision making (see chart for details).     Kynlei Piontek Kingsberry is a 46 y.o. female with a past medical history significant for hypertension and migraines who presents with abdominal pain and chest pain.  Patient says that yesterday she was feeling "off" but was unsure what was wrong.  She says that she woke up around 2 AM this morning having some abdominal discomfort.  She described it as a 6 out of 10 severity and cramping pain in her mid upper abdomen.  She says it also radiated towards her chest.  There was chest tightness and some nausea.  She denied any syncope, palpitations, or shortness of breath.  She denies any traumatic injuries.  She says that she has had abdominal pain with fibroids in the past and says this feels somewhat similar.  She denies any cough, congestion, or hemoptysis.  She denies any urinary symptoms.  She denies constipation, diarrhea,.  She says that her discomfort is currently a 0 out of 10 in the abdomen and chest.  She says that she ate some yogurt the other day and thinks it may be related.  She says that she took antibiotics for bacterial vaginosis last week but denies any further vaginal symptoms.  She subsequent denies any vaginal pain, vaginal discharge, or vaginal bleeding.  She says her menstrual cycle is due next week.  She denies any radiation of her symptoms into her shoulders or neck.  She does have a family history of early CAD but had a previous reassuring stress test.  On exam, lungs are clear.  Chest is nontender.  Abdomen is minimally tender to palpation in the epigastrium.  No CVA tenderness.  No leg tenderness or significant swelling.  No focal neurologic deficits.  Based on patient's symptoms of discomfort after eating yogurt, suspect this may be a gastritis or GI etiology of discomfort.  She will be given a GI  cocktail.  Patient will however have laboratory testing to look for abnormalities in her abdomen or chest.  She will have chest x-ray and EKG.  Anticipate reassessment after workup.  Heart score calculated as a 3.  Patient's laboratory testing is grossly reassuring.  Creatinine slightly elevated from prior but is consistent with prior values.  Troponin and second troponin are negative.  Given patient's low heart score, patient is felt stable for discharge home.  Patient reports her stomach discomfort completely resolved  after GI cocktail.  Suspect a GERD or GI cause of her symptoms.  Patient will be discharged home with PPI.  Patient instructed to follow-up with her PCP for reassessment.  Patient will also observe strict return precautions for any new or worsening symptoms.  Patient discharged in good condition.   Final Clinical Impressions(s) / ED Diagnoses   Final diagnoses:  Precordial pain  Epigastric pain    New Prescriptions Discharge Medication List as of 05/22/2017  1:34 PM    START taking these medications   Details  omeprazole (PRILOSEC) 20 MG capsule Take 1 capsule (20 mg total) by mouth daily., Starting Tue 05/22/2017, Print       Clinical Impression: 1. Precordial pain   2. Epigastric pain     Disposition: Discharge  Condition: Good  I have discussed the results, Dx and Tx plan with the pt(& family if present). He/she/they expressed understanding and agree(s) with the plan. Discharge instructions discussed at great length. Strict return precautions discussed and pt &/or family have verbalized understanding of the instructions. No further questions at time of discharge.    Discharge Medication List as of 05/22/2017  1:34 PM    START taking these medications   Details  omeprazole (PRILOSEC) 20 MG capsule Take 1 capsule (20 mg total) by mouth daily., Starting Tue 05/22/2017, Print        Follow Up: [name], NP [place]  [phone]     [facility] [place] [phone] Schedule an appointment as soon as possible for a visit    [place] [address] [id number] [place] [phone]  If symptoms worsen     Jomari Bartnik, Gwenyth Allegra, MD 05/22/17 1739

## 2017-05-22 NOTE — ED Notes (Signed)
Hooked up patient is resting with family at bedside

## 2017-05-31 ENCOUNTER — Encounter: Payer: Self-pay | Admitting: Adult Health

## 2017-05-31 ENCOUNTER — Inpatient Hospital Stay: Payer: BLUE CROSS/BLUE SHIELD | Admitting: Adult Health

## 2017-05-31 ENCOUNTER — Ambulatory Visit: Payer: BLUE CROSS/BLUE SHIELD | Admitting: Adult Health

## 2017-05-31 VITALS — BP 140/80 | Temp 98.3°F | Wt 246.0 lb

## 2017-05-31 DIAGNOSIS — K2100 Gastro-esophageal reflux disease with esophagitis, without bleeding: Secondary | ICD-10-CM

## 2017-05-31 DIAGNOSIS — I10 Essential (primary) hypertension: Secondary | ICD-10-CM

## 2017-05-31 DIAGNOSIS — R05 Cough: Secondary | ICD-10-CM | POA: Diagnosis not present

## 2017-05-31 DIAGNOSIS — R059 Cough, unspecified: Secondary | ICD-10-CM

## 2017-05-31 MED ORDER — HYDROCODONE-HOMATROPINE 5-1.5 MG/5ML PO SYRP: 5.0000 mL | ORAL_SOLUTION | Freq: Three times a day (TID) | ORAL | 0 refills | Status: DC | PRN

## 2017-05-31 MED ORDER — AMLODIPINE BESYLATE 5 MG PO TABS: 5.0000 mg | ORAL_TABLET | Freq: Every day | ORAL | 3 refills | Status: DC

## 2017-05-31 NOTE — Progress Notes (Signed)
Subjective:    Patient ID: Janet Sanchez, female    DOB: 1971-06-07, 46 y.o.   MRN: [id number]  HPI   46 year old female who presents to the office today for ER follow up. She was seen 10 days ago for abdominal pain and chest pain. Her exam, including labs, chest x ray and EKG were unremarkable. She was given a GI cocktail and a prescribed omeprazole. She reports that she took the omeprazole for three days and then stopped taking the medication because she did not notice any results. She continues to have discomfort in her upper chest but she no longer has stomach pain. She does report that the GI cocktail worked the best to relieve her discomfort.   She also reports that she has a cough that has been present for 2 weeks. The cough is worse at night and she also feels as though she is wheezing. Denies a productive cough, fevers, or feeling acutely ill. Chest x ray in the ER was negative    In addition to this, she was started on Atenol back in August for hypertension and secondary for migraines. She reports that she does not like the way the atenolol makes her feel. Per patient " it makes me feel like I am in a fog, and I have a hard time remembering sometimes." She does not feel like it has changed the intensity of her migraines.   Review of Systems See HPI   Past Medical History:  Diagnosis Date  . Enlarged tonsils   . Herpes   . Hypertension   . Migraines   . Morbid obesity ([place])     Social History   Socioeconomic History  . Marital status: Married    Spouse name: Not on file  . Number of children: Not on file  . Years of education: Not on file  . Highest education level: Not on file  Social Needs  . Financial resource strain: Not on file  . Food insecurity - worry: Not on file  . Food insecurity - inability: Not on file  . Transportation needs - medical: Not on file  . Transportation needs - non-medical: Not on file  Occupational History  . Not on file  Tobacco  Use  . Smoking status: Never Smoker  . Smokeless tobacco: Never Used  Substance and Sexual Activity  . Alcohol use: No  . Drug use: No  . Sexual activity: Yes    Birth control/protection: Surgical  Other Topics Concern  . 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 TUBAL LIGATION      Family History  Problem Relation Age of Onset  . Heart attack Mother 46  . Hypertension Father   . Heart attack Father 30  . Hypertension Sister   . Hypertension Brother   . Arrhythmia Brother   . Hypertension Sister     No Known Allergies  Current Outpatient Medications on File Prior to Visit  Medication Sig Dispense Refill  . acyclovir (ZOVIRAX) 400 MG tablet Take 1 tablet (400 mg total) by mouth 2 (two) times daily. 180 tablet 3  . atenolol (TENORMIN) 50 MG tablet Take 1 tablet (50 mg total) by mouth daily. 90 tablet 3  . hydrochlorothiazide (HYDRODIURIL) 25 MG tablet Take 1 tablet (25 mg total)  by mouth daily. 90 tablet 1  . ibuprofen (ADVIL,MOTRIN) 200 MG tablet Take 600-800 mg by mouth every 6 (six) hours as needed for headache or mild pain.    . [DISCONTINUED] omeprazole (PRILOSEC) 20 MG capsule Take 1 capsule (20 mg total) by mouth daily. 21 capsule 0   No current facility-administered medications on file prior to visit.     BP 140/80   Temp 98.3 F (36.8 C) (Oral)   Wt 246 lb (111.6 kg)   BMI 43.58 kg/m       Objective:   Physical Exam  Constitutional: She is oriented to person, place, and time. She appears well-developed and well-nourished. No distress.  Cardiovascular: Normal rate, regular rhythm, normal heart sounds and intact distal pulses. Exam reveals no gallop.  No murmur heard. Pulmonary/Chest: Effort normal and breath sounds normal. No respiratory distress. She has no wheezes. She has no rales. She exhibits  tenderness (left upper chest/mid sternal area ).  Abdominal: Soft. Bowel sounds are normal. She exhibits no distension and no mass. There is no tenderness. There is no rebound and no guarding.  Neurological: She is alert and oriented to person, place, and time.  Skin: Skin is warm and dry. No rash noted. She is not diaphoretic. No erythema. No pallor.  Psychiatric: She has a normal mood and affect. Her behavior is normal. Judgment and thought content normal.  Nursing note and vitals reviewed.     Assessment & Plan:  1. Gastroesophageal reflux disease with esophagitis - I would like her to restart prilosec  - Work on [employer]  - Follow up in 2 weeks  - Consider GI referral   2. Essential hypertension - Will d/c Atenolol and start on norvasc  - Follow up in 2 weeks  - amLODipine (NORVASC) 5 MG tablet; Take 1 tablet (5 mg total) daily by mouth.  Dispense: 90 tablet; Refill: 3  3. Cough - no signs of pneumonia or bronchitis  - HYDROcodone-homatropine (HYCODAN) 5-1.5 MG/5ML syrup; Take 5 mLs every 8 (eight) hours as needed by mouth for cough.  Dispense: 120 mL; Refill: 0  [name], NP

## 2017-06-12 ENCOUNTER — Other Ambulatory Visit: Payer: Self-pay | Admitting: Adult Health

## 2017-06-12 DIAGNOSIS — I10 Essential (primary) hypertension: Secondary | ICD-10-CM

## 2017-06-12 NOTE — Telephone Encounter (Signed)
Sent to the pharmacy by e-scribe for 30 days.  Pt due for cpx 06/2017

## 2017-06-19 ENCOUNTER — Ambulatory Visit: Payer: BLUE CROSS/BLUE SHIELD | Admitting: Adult Health

## 2017-07-10 ENCOUNTER — Encounter: Payer: Self-pay | Admitting: Adult Health

## 2017-07-10 ENCOUNTER — Ambulatory Visit: Payer: BLUE CROSS/BLUE SHIELD | Admitting: Adult Health

## 2017-07-10 VITALS — BP 150/94 | Temp 97.7°F | Wt 246.0 lb

## 2017-07-10 DIAGNOSIS — K219 Gastro-esophageal reflux disease without esophagitis: Secondary | ICD-10-CM | POA: Insufficient documentation

## 2017-07-10 DIAGNOSIS — I10 Essential (primary) hypertension: Secondary | ICD-10-CM | POA: Diagnosis not present

## 2017-07-10 MED ORDER — LOSARTAN POTASSIUM 50 MG PO TABS: 50.0000 mg | ORAL_TABLET | Freq: Every day | ORAL | 2 refills | Status: DC

## 2017-07-10 MED ORDER — OMEPRAZOLE 20 MG PO CPDR: 20.0000 mg | DELAYED_RELEASE_CAPSULE | Freq: Every day | ORAL | 3 refills | Status: DC

## 2017-07-10 NOTE — Progress Notes (Signed)
Subjective:    Patient ID: Janet Sanchez, female    DOB: October 14, 1970, 46 y.o.   MRN: [id number]  HPI  46 year old female who  has a past medical history of Enlarged tonsils, Herpes, Hypertension, Migraines, and Morbid obesity ([place]). She presents to the office today for follow up today regarding GERD and Hypertension. During her last visit she was seen after being in the ER for chest pain and abdominal pain. Her exam in the ER was unremarkable. She was started on Prilosec for GERD but stopped three days later due to no resolution in her symptoms. She was asked to restart Prilosec.   Her blood pressure medication was also changed during the last visit. She was originally on Atenolol for hypertension and secondary for migraines. She felt as though the Atenolol was causing her to " be in a fog". She did not notice any difference in her migraines. She was switched to Norvasc.   Today in the office she reports her GERD symptoms have improved but she has run out of the medication  As for the change in her blood pressure medication she feels as though this is causing her to have palpitations. She has not been checking her blood pressure at home.   Review of Systems See HPI   Past Medical History:  Diagnosis Date  . Enlarged tonsils   . Herpes   . Hypertension   . Migraines   . Morbid obesity ([place])     Social History   Socioeconomic History  . Marital status: Married    Spouse name: Not on file  . Number of children: Not on file  . Years of education: Not on file  . Highest education level: Not on file  Social Needs  . Financial resource strain: Not on file  . Food insecurity - worry: Not on file  . Food insecurity - inability: Not on file  . Transportation needs - medical: Not on file  . Transportation needs - non-medical: Not on file  Occupational History  . Not on file  Tobacco Use  . Smoking status: Never Smoker  . Smokeless tobacco: Never Used  Substance and Sexual  Activity  . Alcohol use: No  . Drug use: No  . Sexual activity: Yes    Birth control/protection: Surgical  Other Topics Concern  . 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 TUBAL LIGATION      Family History  Problem Relation Age of Onset  . Heart attack Mother 62  . Hypertension Father   . Heart attack Father 26  . Hypertension Sister   . Hypertension Brother   . Arrhythmia Brother   . Hypertension Sister     No Known Allergies  Current Outpatient Medications on File Prior to Visit  Medication Sig Dispense Refill  . acyclovir (ZOVIRAX) 400 MG tablet Take 1 tablet (400 mg total) by mouth 2 (two) times daily. 180 tablet 3  . amLODipine (NORVASC) 5 MG tablet Take 1 tablet (5 mg total) daily by mouth. 90 tablet 3  . aspirin EC 81 MG tablet Take 81 mg by mouth daily.    . hydrochlorothiazide (HYDRODIURIL) 25 MG tablet TAKE 1 TABLET BY MOUTH daily 30 tablet 0  . ibuprofen (ADVIL,MOTRIN) 200 MG tablet Take 600-800 mg by  mouth every 6 (six) hours as needed for headache or mild pain.    Marland Kitchen HYDROcodone-homatropine (HYCODAN) 5-1.5 MG/5ML syrup Take 5 mLs every 8 (eight) hours as needed by mouth for cough. (Patient not taking: Reported on 07/10/2017) 120 mL 0  . [DISCONTINUED] omeprazole (PRILOSEC) 20 MG capsule Take 1 capsule (20 mg total) by mouth daily. 21 capsule 0   No current facility-administered medications on file prior to visit.     BP (!) 150/94 (BP Location: Left Arm)   Temp 97.7 F (36.5 C) (Oral)   Wt 246 lb (111.6 kg)   BMI 43.58 kg/m       Objective:   Physical Exam  Constitutional: She is oriented to person, place, and time. She appears well-developed and well-nourished. No distress.  Obese   Cardiovascular: Normal rate, regular rhythm, normal heart sounds and intact distal pulses. Exam reveals no gallop and  no friction rub.  No murmur heard. Pulmonary/Chest: Effort normal and breath sounds normal. No respiratory distress. She has no wheezes. She has no rales. She exhibits no tenderness.  Abdominal: Soft. Bowel sounds are normal. She exhibits no distension and no mass. There is no tenderness. There is no rebound and no guarding.  Musculoskeletal: Normal range of motion. She exhibits no edema, tenderness or deformity.  Neurological: She is alert and oriented to person, place, and time. She has normal reflexes. She displays normal reflexes. No cranial nerve deficit. She exhibits normal muscle tone. Coordination normal.  Skin: Skin is warm and dry. No rash noted. She is not diaphoretic. No erythema. No pallor.  Psychiatric: She has a normal mood and affect. Her behavior is normal. Thought content normal.  Nursing note and vitals reviewed.     Assessment & Plan:  1. Essential hypertension - D/c Norvasc and trial on Cozaar  - losartan (COZAAR) 50 MG tablet; Take 1 tablet (50 mg total) by mouth daily.  Dispense: 30 tablet; Refill: 2 - Follow up in 2-3 weeks or sooner if needed - Advised to monitor BP at home and bring log book with her to next appointment   2. Gastroesophageal reflux disease without esophagitis - Appears to be improved. Continue on omeprazole  - omeprazole (PRILOSEC) 20 MG capsule; Take 1 capsule (20 mg total) by mouth daily.  Dispense: 90 capsule; Refill: 3   [name], NP

## 2017-07-29 ENCOUNTER — Encounter (HOSPITAL_COMMUNITY): Payer: Self-pay | Admitting: *Deleted

## 2017-07-29 ENCOUNTER — Emergency Department (HOSPITAL_COMMUNITY)
Admission: EM | Admit: 2017-07-29 | Discharge: 2017-07-29 | Disposition: A | Discharge status: Home / Self Care | Payer: BLUE CROSS/BLUE SHIELD | Attending: Emergency Medicine | Admitting: Emergency Medicine

## 2017-07-29 ENCOUNTER — Other Ambulatory Visit: Payer: Self-pay

## 2017-07-29 DIAGNOSIS — R55 Syncope and collapse: Secondary | ICD-10-CM | POA: Diagnosis not present

## 2017-07-29 DIAGNOSIS — R002 Palpitations: Secondary | ICD-10-CM | POA: Insufficient documentation

## 2017-07-29 DIAGNOSIS — Z5321 Procedure and treatment not carried out due to patient leaving prior to being seen by health care provider: Secondary | ICD-10-CM | POA: Diagnosis not present

## 2017-07-29 LAB — CBC
HCT: 35.1 % — ABNORMAL LOW (ref 36.0–46.0)
Hemoglobin: 11.3 g/dL — ABNORMAL LOW (ref 12.0–15.0)
MCH: 28.4 pg (ref 26.0–34.0)
MCHC: 32.2 g/dL (ref 30.0–36.0)
MCV: 88.2 fL (ref 78.0–100.0)
PLATELETS: 309 10*3/uL (ref 150–400)
RBC: 3.98 MIL/uL (ref 3.87–5.11)
RDW: 12.8 % (ref 11.5–15.5)
WBC: 6.2 10*3/uL (ref 4.0–10.5)

## 2017-07-29 LAB — BASIC METABOLIC PANEL
ANION GAP: 11 (ref 5–15)
BUN: 14 mg/dL (ref 6–20)
CALCIUM: 9.4 mg/dL (ref 8.9–10.3)
CO2: 26 mmol/L (ref 22–32)
CREATININE: 1.14 mg/dL — AB (ref 0.44–1.00)
Chloride: 99 mmol/L — ABNORMAL LOW (ref 101–111)
GFR calc Af Amer: >60 mL/min (ref >60)
GFR, EST NON AFRICAN AMERICAN: 57 mL/min — AB (ref >60)
Glucose, Bld: 83 mg/dL (ref 65–99)
Potassium: 3.4 mmol/L — ABNORMAL LOW (ref 3.5–5.1)
Sodium: 136 mmol/L (ref 135–145)

## 2017-07-29 LAB — I-STAT BETA HCG BLOOD, ED (MC, WL, AP ONLY)

## 2017-07-29 MED ORDER — IBUPROFEN 400 MG PO TABS
400.0000 mg | ORAL_TABLET | Freq: Once | ORAL | Status: AC | PRN
  Administered 2017-07-29: 400 mg via ORAL
  Filled 2017-07-29: qty 1

## 2017-07-29 NOTE — ED Triage Notes (Signed)
Pt states she felt weak, like she was going to pass out, so she checked her bp and it was 88/40.  Then she felt as if her heart was fluttering.  VS stable in triage.  BP 156/99.  PT denies any chest pain, pressure, nausea, etc.

## 2017-07-29 NOTE — ED Notes (Signed)
Pt stated that they could not wait any longer.  This Probation officer encouraged pt to stay based on BP.  Pt. Stated that she took meds for it this  Morning and would take more in morning, also has appt with Dr.  On 1/15.  Pt was encouraged to come back if anything changes.  She stated that she would.  Pt LWBS

## 2017-07-31 ENCOUNTER — Ambulatory Visit (HOSPITAL_COMMUNITY)
Admission: EM | Admit: 2017-07-31 | Discharge: 2017-07-31 | Disposition: A | Discharge status: Home / Self Care | Payer: BLUE CROSS/BLUE SHIELD | Attending: Internal Medicine | Admitting: Internal Medicine

## 2017-07-31 ENCOUNTER — Other Ambulatory Visit: Payer: Self-pay

## 2017-07-31 ENCOUNTER — Encounter (HOSPITAL_COMMUNITY): Payer: Self-pay | Admitting: Emergency Medicine

## 2017-07-31 DIAGNOSIS — R51 Headache: Secondary | ICD-10-CM

## 2017-07-31 DIAGNOSIS — R002 Palpitations: Secondary | ICD-10-CM | POA: Diagnosis not present

## 2017-07-31 DIAGNOSIS — R110 Nausea: Secondary | ICD-10-CM

## 2017-07-31 DIAGNOSIS — R519 Headache, unspecified: Secondary | ICD-10-CM

## 2017-07-31 MED ORDER — METOCLOPRAMIDE HCL 5 MG/ML IJ SOLN
INTRAMUSCULAR | Status: AC
  Filled 2017-07-31: qty 2

## 2017-07-31 MED ORDER — DEXAMETHASONE SODIUM PHOSPHATE 10 MG/ML IJ SOLN
INTRAMUSCULAR | Status: AC
  Filled 2017-07-31: qty 1

## 2017-07-31 MED ORDER — DEXAMETHASONE SODIUM PHOSPHATE 10 MG/ML IJ SOLN
10.0000 mg | Freq: Once | INTRAMUSCULAR | Status: AC
  Administered 2017-07-31: 10 mg via INTRAMUSCULAR

## 2017-07-31 MED ORDER — KETOROLAC TROMETHAMINE 60 MG/2ML IM SOLN
60.0000 mg | Freq: Once | INTRAMUSCULAR | Status: AC
  Administered 2017-07-31: 60 mg via INTRAMUSCULAR

## 2017-07-31 MED ORDER — METOCLOPRAMIDE HCL 5 MG/ML IJ SOLN
5.0000 mg | Freq: Once | INTRAMUSCULAR | Status: AC
  Administered 2017-07-31: 5 mg via INTRAMUSCULAR

## 2017-07-31 MED ORDER — KETOROLAC TROMETHAMINE 60 MG/2ML IM SOLN
INTRAMUSCULAR | Status: AC
  Filled 2017-07-31: qty 2

## 2017-07-31 NOTE — Discharge Instructions (Signed)
We gave you shots for your headache. Please continue Ibuprofen at home for your headache. If headaches are not controlled with ibuprofen/tylenol, you may want to follow up with neurology. Please discuss with your primary doctor about this.   EKG looked okay today. Please continue to stay hydrated, drink plenty of water. Continue to follow up on 1/15.   If you develop any lightheadedness, dizziness, chest pain, shortness of breath, weakness, worsening headache, changes in vision, please return or go straight to emergency room.

## 2017-07-31 NOTE — ED Triage Notes (Signed)
Pt c/o HA and palpitations x1 week, went to ER but LWBS, pt states she is still feeling palpitations.

## 2017-07-31 NOTE — ED Provider Notes (Signed)
[place]    CSN: [id number] Arrival date & time: 07/31/17  1358     History   Chief Complaint Chief Complaint  Patient presents with  . Headache  . Palpitations    HPI Janet Sanchez is a 47 y.o. female presenting with headache and palpitations. Headache is a 7/10 and has tried Excedrin without relief. Headache is similar to her normal headache, typically relieved with ibuprofen but patient is out of ibuprofen. Associated nausea. No photophobia, phonophobia, visual disturbance.  She has recently felt occasional fluttering in her chest since Sunday, no chest pain or shortness of breath. She has tried to stay hydrated and drink beet juice. Patient is concerned about her heart. Denies caffiene use, occasional soda, drinks mainly water. No drug use, denies smoking.  HPI  Past Medical History:  Diagnosis Date  . Enlarged tonsils   . Herpes   . Hypertension   . Migraines   . Morbid obesity [facility])     Patient Active Problem List   Diagnosis Date Noted  . GERD (gastroesophageal reflux disease) 07/10/2017  . Morbid obesity ([place]) 11/19/2013  . DUB (dysfunctional uterine bleeding) 05/08/2013  . Fibroid uterus 03/01/2011  . HERPETIC VULVOVAGINITIS 04/30/2007  . HSV 04/30/2007  . CARPAL TUNNEL SYNDROME, RIGHT 04/30/2007  . Essential hypertension 04/30/2007  . HYPERTROPHY, TONSILS 04/30/2007  . LOW BACK PAIN SYNDROME 04/30/2007    Past Surgical History:  Procedure Laterality Date  . TUBAL LIGATION      OB History    Gravida Para Term Preterm AB Living   10 5 4 1 5 5    SAB TAB Ectopic Multiple Live Births   5               Home Medications    Prior to Admission medications   Medication Sig Start Date End Date Taking? Authorizing Provider  acyclovir (ZOVIRAX) 400 MG tablet Take 1 tablet (400 mg total) by mouth 2 (two) times daily. 03/20/17   [name], NP  aspirin EC 81 MG tablet Take 81 mg by mouth daily.    [provider]    hydrochlorothiazide (HYDRODIURIL) 25 MG tablet TAKE 1 TABLET BY MOUTH daily 06/12/17   Nafziger, Tommi Rumps, NP  ibuprofen (ADVIL,MOTRIN) 200 MG tablet Take 600-800 mg by mouth every 6 (six) hours as needed for headache or mild pain.    [provider]  losartan (COZAAR) 50 MG tablet Take 1 tablet (50 mg total) by mouth daily. 07/10/17   Nafziger, Tommi Rumps, NP  omeprazole (PRILOSEC) 20 MG capsule Take 1 capsule (20 mg total) by mouth daily. 07/10/17   [name], NP    Family History Family History  Problem Relation Age of Onset  . Heart attack Mother 75  . Hypertension Father   . Heart attack Father 85  . Hypertension Sister   . Hypertension Brother   . Arrhythmia Brother   . Hypertension Sister     Social History Social History   Tobacco Use  . Smoking status: Never Smoker  . Smokeless tobacco: Never Used  Substance Use Topics  . Alcohol use: No  . Drug use: No     Allergies   Patient has no known allergies.   Review of Systems Review of Systems  Constitutional: Negative for fatigue and fever.  HENT: Negative for congestion, ear pain and sore throat.   Eyes: Negative for photophobia, pain and visual disturbance.  Respiratory: Negative for shortness of breath.   Cardiovascular: Positive for palpitations. Negative  for chest pain.  Gastrointestinal: Positive for nausea. Negative for abdominal pain and vomiting.  Skin: Negative for rash.  Neurological: Positive for light-headedness and headaches. Negative for dizziness, syncope, speech difficulty and weakness.     Physical Exam Triage Vital Signs ED Triage Vitals [07/31/17 1418]  Enc Vitals Group     BP (!) 165/100     Pulse Rate 69     Resp 18     Temp 98.3 F (36.8 C)     Temp src      SpO2 100 %     Weight      Height      Head Circumference      Peak Flow      Pain Score 8     Pain Loc      Pain Edu?      Excl. in [place]?    No data found.  Updated Vital Signs BP (!) 165/100   Pulse 69   Temp  98.3 F (36.8 C)   Resp 18   LMP 07/24/2017   SpO2 100%   Visual Acuity Right Eye Distance:   Left Eye Distance:   Bilateral Distance:    Right Eye Near:   Left Eye Near:    Bilateral Near:     Physical Exam  Constitutional: She is oriented to person, place, and time. She appears well-developed and well-nourished. No distress.  HENT:  Head: Normocephalic and atraumatic.  Right Ear: Tympanic membrane and ear canal normal.  Left Ear: Tympanic membrane and ear canal normal.  Mouth/Throat: Uvula is midline, oropharynx is clear and moist and mucous membranes are normal. No oral lesions. No trismus in the jaw. No uvula swelling. No posterior oropharyngeal erythema.  Eyes: Conjunctivae and EOM are normal. Pupils are equal, round, and reactive to light.  Neck: Neck supple.  Cardiovascular: Normal rate and regular rhythm.  No murmur heard. Pulmonary/Chest: Effort normal and breath sounds normal. No respiratory distress.  Abdominal: Soft. There is no tenderness.  Musculoskeletal: She exhibits no edema.  Neurological: She is alert and oriented to person, place, and time. She has normal strength. She displays normal reflexes. No cranial nerve deficit. Coordination and gait normal.  Skin: Skin is warm and dry.  Psychiatric: She has a normal mood and affect.  Nursing note and vitals reviewed.    UC Treatments / Results  Labs (all labs ordered are listed, but only abnormal results are displayed) Labs Reviewed - No data to display  EKG  EKG Interpretation None     EKG-normal sinus rhythm- slightly varying R-R intervals, no sign of ischemia.   Radiology No results found.  Procedures Procedures (including critical care time)  Medications Ordered in UC Medications  ketorolac (TORADOL) injection 60 mg (60 mg Intramuscular Given 07/31/17 1536)  metoCLOPramide (REGLAN) injection 5 mg (5 mg Intramuscular Given 07/31/17 1536)  dexamethasone (DECADRON) injection 10 mg (10 mg  Intramuscular Given 07/31/17 1536)     Initial Impression / Assessment and Plan / UC Course  I have reviewed the triage vital signs and the nursing notes.  Pertinent labs & imaging results that were available during my care of the patient were reviewed by me and considered in my medical decision making (see chart for details).     Patient with relatively normal EKG, no chest pain. Recent stress test in 10/18. Fluttering/Palpitations with unclear cause. Advised to followup with PCP on 1/15 as scheduled. Discussed strict return precautions. Patient verbalized understanding and is agreeable with plan.  Headache with normal pattern, headache injections given. Advised continue ibuprofen for management at home. May want to follow up with neurology if headaches becoming more frequent. Discussed strict return precautions. Patient verbalized understanding and is agreeable with plan.   Blood pressure has been consistently elevated- PCP follow up, monitor at home.  Final Clinical Impressions(s) / UC Diagnoses   Final diagnoses:  Palpitations  Bad headache    ED Discharge Orders    None       Controlled Substance Prescriptions [place] Controlled Substance Registry consulted? Not Applicable   Shelton Silvas 07/31/17 2114    Janith Lima, PA-C 07/31/17 2116

## 2017-08-02 ENCOUNTER — Other Ambulatory Visit: Payer: Self-pay

## 2017-08-02 ENCOUNTER — Emergency Department (HOSPITAL_BASED_OUTPATIENT_CLINIC_OR_DEPARTMENT_OTHER)
Admission: EM | Admit: 2017-08-02 | Discharge: 2017-08-02 | Disposition: A | Discharge status: Home / Self Care | Payer: BLUE CROSS/BLUE SHIELD | Attending: Emergency Medicine | Admitting: Emergency Medicine

## 2017-08-02 ENCOUNTER — Emergency Department (HOSPITAL_BASED_OUTPATIENT_CLINIC_OR_DEPARTMENT_OTHER): Payer: BLUE CROSS/BLUE SHIELD

## 2017-08-02 ENCOUNTER — Encounter (HOSPITAL_BASED_OUTPATIENT_CLINIC_OR_DEPARTMENT_OTHER): Payer: Self-pay | Admitting: Emergency Medicine

## 2017-08-02 DIAGNOSIS — Z79899 Other long term (current) drug therapy: Secondary | ICD-10-CM | POA: Insufficient documentation

## 2017-08-02 DIAGNOSIS — R0789 Other chest pain: Secondary | ICD-10-CM | POA: Diagnosis present

## 2017-08-02 DIAGNOSIS — I10 Essential (primary) hypertension: Secondary | ICD-10-CM | POA: Diagnosis not present

## 2017-08-02 DIAGNOSIS — R195 Other fecal abnormalities: Secondary | ICD-10-CM | POA: Diagnosis not present

## 2017-08-02 LAB — CBC
HCT: 36 % (ref 36.0–46.0)
HEMOGLOBIN: 11.9 g/dL — AB (ref 12.0–15.0)
MCH: 29.2 pg (ref 26.0–34.0)
MCHC: 33.1 g/dL (ref 30.0–36.0)
MCV: 88.2 fL (ref 78.0–100.0)
Platelets: 298 10*3/uL (ref 150–400)
RBC: 4.08 MIL/uL (ref 3.87–5.11)
RDW: 13.2 % (ref 11.5–15.5)
WBC: 7.2 10*3/uL (ref 4.0–10.5)

## 2017-08-02 LAB — BASIC METABOLIC PANEL
ANION GAP: 10 (ref 5–15)
BUN: 18 mg/dL (ref 6–20)
CALCIUM: 9.1 mg/dL (ref 8.9–10.3)
CO2: 26 mmol/L (ref 22–32)
Chloride: 102 mmol/L (ref 101–111)
Creatinine, Ser: 1.4 mg/dL — ABNORMAL HIGH (ref 0.44–1.00)
GFR calc Af Amer: 51 mL/min — ABNORMAL LOW (ref >60)
GFR, EST NON AFRICAN AMERICAN: 44 mL/min — AB (ref >60)
GLUCOSE: 95 mg/dL (ref 65–99)
POTASSIUM: 3.5 mmol/L (ref 3.5–5.1)
Sodium: 138 mmol/L (ref 135–145)

## 2017-08-02 LAB — TROPONIN I

## 2017-08-02 LAB — OCCULT BLOOD X 1 CARD TO LAB, STOOL: FECAL OCCULT BLD: NEGATIVE

## 2017-08-02 LAB — PREGNANCY, URINE: PREG TEST UR: NEGATIVE

## 2017-08-02 MED ORDER — GI COCKTAIL ~~LOC~~
30.0000 mL | Freq: Once | ORAL | Status: AC
  Administered 2017-08-02: 30 mL via ORAL
  Filled 2017-08-02: qty 30

## 2017-08-02 MED ORDER — ACETAMINOPHEN 500 MG PO TABS
1000.0000 mg | ORAL_TABLET | Freq: Once | ORAL | Status: AC
  Administered 2017-08-02: 1000 mg via ORAL
  Filled 2017-08-02: qty 2

## 2017-08-02 MED ORDER — METOCLOPRAMIDE HCL 10 MG PO TABS
10.0000 mg | ORAL_TABLET | Freq: Once | ORAL | Status: AC
  Administered 2017-08-02: 10 mg via ORAL
  Filled 2017-08-02: qty 1

## 2017-08-02 NOTE — ED Notes (Signed)
ED Provider at bedside. 

## 2017-08-02 NOTE — ED Triage Notes (Addendum)
Pt reports chest tightness that started this morning and had 1 episode of bright red bleeding with a BM this morning. Pt also reports she has been drinking beet juice for 1 week which may be contributing to the red stool.

## 2017-08-02 NOTE — ED Provider Notes (Signed)
[facility] EM[facility]ENC[facility] DEPARTMENT Provider Note  CSN: [id number] Arrival date & time: 08/02/17 0745  Chief Complaint(s) Chest Pain and Rectal Bleeding  HPI Janet Sanchez is a 47 y.o. female   The history is provided by the patient.  Chest Pain   This is a new problem. The current episode started 3 to 5 hours ago. Episode frequency: intermittent. The problem has been resolved. The pain is present in the substernal region. The pain is mild. Quality: tightness. The pain does not radiate. The symptoms are aggravated by certain positions. Associated symptoms include headaches (chronic) and nausea. Pertinent negatives include no back pain, no cough, no diaphoresis, no fever, no hemoptysis, no leg pain, no lower extremity edema, no malaise/fatigue, no shortness of breath and no vomiting. She has tried nothing for the symptoms. Risk factors include obesity.  Her past medical history is significant for hypertension.  Pertinent negatives for past medical history include no CAD, no cancer, no CHF, no diabetes, no hyperlipidemia, no MI, no PE and no strokes.  Her family medical history is significant for early MI.  Procedure history is positive for echocardiogram (mild MR only) and exercise treadmill test (negative on 04/2017).  Rectal Bleeding  Associated symptoms: no fever and no vomiting   states that she has been drinking beet use recently.   Past Medical History Past Medical History:  Diagnosis Date  . Enlarged tonsils   . Herpes   . Hypertension   . Migraines   . Morbid obesity [facility])    Patient Active Problem List   Diagnosis Date Noted  . GERD (gastroesophageal reflux disease) 07/10/2017  . Morbid obesity ([place]) 11/19/2013  . DUB (dysfunctional uterine bleeding) 05/08/2013  . Fibroid uterus 03/01/2011  . HERPETIC VULVOVAGINITIS 04/30/2007  . HSV 04/30/2007  . CARPAL TUNNEL SYNDROME, RIGHT 04/30/2007  . Essential hypertension 04/30/2007  . HYPERTROPHY, TONSILS  04/30/2007  . LOW BACK PAIN SYNDROME 04/30/2007   Home Medication(s) Prior to Admission medications   Medication Sig Start Date End Date Taking? Authorizing Provider  acyclovir (ZOVIRAX) 400 MG tablet Take 1 tablet (400 mg total) by mouth 2 (two) times daily. 03/20/17   [name], NP  aspirin EC 81 MG tablet Take 81 mg by mouth daily.    [provider]  hydrochlorothiazide (HYDRODIURIL) 25 MG tablet TAKE 1 TABLET BY MOUTH daily 06/12/17   Nafziger, Tommi Rumps, NP  ibuprofen (ADVIL,MOTRIN) 200 MG tablet Take 600-800 mg by mouth every 6 (six) hours as needed for headache or mild pain.    [provider]  losartan (COZAAR) 50 MG tablet Take 1 tablet (50 mg total) by mouth daily. 07/10/17   Nafziger, Tommi Rumps, NP  omeprazole (PRILOSEC) 20 MG capsule Take 1 capsule (20 mg total) by mouth daily. 07/10/17   [name], NP  Past Surgical History Past Surgical History:  Procedure Laterality Date  . TUBAL LIGATION     Family History Family History  Problem Relation Age of Onset  . Heart attack Mother 10  . Hypertension Father   . Heart attack Father 65  . Hypertension Sister   . Hypertension Brother   . Arrhythmia Brother   . Hypertension Sister     Social History Social History   Tobacco Use  . Smoking status: Never Smoker  . Smokeless tobacco: Never Used  Substance Use Topics  . Alcohol use: No  . Drug use: No   Allergies Patient has no known allergies.  Review of Systems Review of Systems  Constitutional: Negative for diaphoresis, fever and malaise/fatigue.  Respiratory: Negative for cough, hemoptysis and shortness of breath.   Cardiovascular: Positive for chest pain.  Gastrointestinal: Positive for hematochezia and nausea. Negative for vomiting.  Musculoskeletal: Negative for back pain.  Neurological: Positive for headaches  (chronic).   All other systems are reviewed and are negative for acute change except as noted in the HPI  Physical Exam Vital Signs  I have reviewed the triage vital signs BP (!) 157/96 (BP Location: Left Arm)   Pulse (!) 59   Temp 98.2 F (36.8 C) (Oral)   Resp 14   Ht 5\' 3"  (1.6 m)   Wt 110.1 kg (242 lb 12.8 oz)   LMP 07/24/2017   SpO2 98%   BMI 43.01 kg/m   Physical Exam  Constitutional: She is oriented to person, place, and time. She appears well-developed and well-nourished. No distress.  HENT:  Head: Normocephalic and atraumatic.  Nose: Nose normal.  Eyes: Conjunctivae and EOM are normal. Pupils are equal, round, and reactive to light. Right eye exhibits no discharge. Left eye exhibits no discharge. No scleral icterus.  Neck: Normal range of motion. Neck supple.  Cardiovascular: Normal rate and regular rhythm. Exam reveals no gallop and no friction rub.  No murmur heard. Pulmonary/Chest: Effort normal and breath sounds normal. No stridor. No respiratory distress. She has no rales.  Abdominal: Soft. She exhibits no distension. There is no tenderness.  Musculoskeletal: She exhibits no edema or tenderness.  Neurological: She is alert and oriented to person, place, and time.  Skin: Skin is warm and dry. No rash noted. She is not diaphoretic. No erythema.  Psychiatric: She has a normal mood and affect.  Vitals reviewed.   ED Results and Treatments Labs (all labs ordered are listed, but only abnormal results are displayed) Labs Reviewed  BASIC METABOLIC PANEL - Abnormal; Notable for the following components:      Result Value   Creatinine, Ser 1.40 (*)    GFR calc non Af Amer 44 (*)    GFR calc Af Amer 51 (*)    All other components within normal limits  CBC - Abnormal; Notable for the following components:   Hemoglobin 11.9 (*)    All other components within normal limits  TROPONIN I  PREGNANCY, URINE  TROPONIN I  OCCULT BLOOD X 1 CARD TO LAB, STOOL  EKG  EKG Interpretation  Date/Time:  Thursday August 02 2017 08:00:28 EST Ventricular Rate:  66 PR Interval:    QRS Duration: 104 QT Interval:  395 QTC Calculation: 414 R Axis:   11 Text Interpretation:  Sinus rhythm No significant change since last tracing Confirmed by Addison Lank [phone]) on 08/02/2017 10:21:24 AM      Radiology Dg Chest 2 View  Result Date: 08/02/2017 CLINICAL DATA:  Chest tightness. EXAM: CHEST  2 VIEW COMPARISON:  Chest x-ray dated May 22, 2017. FINDINGS: The heart size and mediastinal contours are within normal limits. Both lungs are clear. The visualized skeletal structures are unremarkable. IMPRESSION: No active cardiopulmonary disease. Electronically Signed   By: Titus Dubin M.D.   On: 08/02/2017 08:21   Pertinent labs & imaging results that were available during my care of the patient were reviewed by me and considered in my medical decision making (see chart for details).  Medications Ordered in ED Medications  gi cocktail (Maalox,Lidocaine,Donnatal) (30 mLs Oral Given 08/02/17 0858)  acetaminophen (TYLENOL) tablet 1,000 mg (1,000 mg Oral Given 08/02/17 0857)  metoCLOPramide (REGLAN) tablet 10 mg (10 mg Oral Given 08/02/17 0857)                                                                                                                                    Procedures Procedures  (including critical care time)  Medical Decision Making / ED Course I have reviewed the nursing notes for this encounter and the patient's prior records (if available in EHR or on provided paperwork).    1. Chest pain Atypical chest pain.  EKG without acute ischemic changes or evidence of pericarditis.  Initial troponin negative.  Heart score less than 4 and appropriate for delta troponin.  Presentation is not classic for aortic dissection or esophageal  perforation.  Low pretest probability for pulmonary embolism Chest x-ray without evidence suggestive of pneumonia, pneumothorax, pneumomediastinum.  No abnormal contour of the mediastinum to suggest dissection. No evidence of acute injuries.  Delta troponin was negative.  Patient was given GI cocktail which completely resolved her symptomatology.  Likely GI related.  2.  Red stool Patient reports recent beet consumption.  Abdomen is benign.  Labs are grossly reassuring.  Hemoccult is negative.  Likely colorization from beets.  The patient appears reasonably screened and/or stabilized for discharge and I doubt any other medical condition or other [facility] requiring further screening, evaluation, or treatment in the ED at this time prior to discharge.  The patient is safe for discharge with strict return precautions.    Final Clinical Impression(s) / ED Diagnoses Final diagnoses:  Atypical chest pain  Red stool    Disposition: Discharge  Condition: Good  I have discussed the results, Dx and Tx plan with the patient who expressed understanding and agree(s) with the plan. Discharge instructions discussed at great length. The patient was given strict return precautions who verbalized  understanding of the instructions. No further questions at time of discharge.    ED Discharge Orders    None       Follow Up: [name], NP [place] [phone]  Schedule an appointment as soon as possible for a visit  As needed     This chart was dictated using voice recognition software.  Despite best efforts to proofread,  errors can occur which can change the documentation meaning.   Fatima Blank, MD 08/02/17 [phone]

## 2017-08-07 ENCOUNTER — Ambulatory Visit: Payer: BLUE CROSS/BLUE SHIELD | Admitting: Adult Health

## 2017-08-07 ENCOUNTER — Encounter: Payer: Self-pay | Admitting: Adult Health

## 2017-08-07 ENCOUNTER — Encounter: Payer: Self-pay | Admitting: Neurology

## 2017-08-07 VITALS — BP 140/60 | Temp 98.3°F | Ht 63.0 in | Wt 239.1 lb

## 2017-08-07 DIAGNOSIS — G43811 Other migraine, intractable, with status migrainosus: Secondary | ICD-10-CM

## 2017-08-07 DIAGNOSIS — I10 Essential (primary) hypertension: Secondary | ICD-10-CM | POA: Diagnosis not present

## 2017-08-07 DIAGNOSIS — R350 Frequency of micturition: Secondary | ICD-10-CM

## 2017-08-07 LAB — POCT URINALYSIS DIPSTICK
Bilirubin, UA: NEGATIVE
Glucose, UA: NEGATIVE
KETONES UA: NEGATIVE
LEUKOCYTES UA: NEGATIVE
NITRITE UA: NEGATIVE
Protein, UA: NEGATIVE
RBC UA: NEGATIVE
SPEC GRAV UA: 1.02 (ref 1.010–1.025)
UROBILINOGEN UA: 0.2 U/dL
pH, UA: 6 (ref 5.0–8.0)

## 2017-08-07 MED ORDER — LOSARTAN POTASSIUM 50 MG PO TABS: 75.0000 mg | ORAL_TABLET | Freq: Every day | ORAL | 1 refills | Status: DC

## 2017-08-07 MED ORDER — AMITRIPTYLINE HCL 10 MG PO TABS: 10.0000 mg | ORAL_TABLET | Freq: Every day | ORAL | 3 refills | Status: DC

## 2017-08-07 NOTE — Progress Notes (Signed)
Subjective:    Patient ID: Janet Sanchez, female    DOB: 1971/02/05, 47 y.o.   MRN: [id number]  HPI  47 year old female who  has a past medical history of Enlarged tonsils, Herpes, Hypertension, Migraines, and Morbid obesity ([place]). she presents to the clinic today for one month follow up for hypertension. During her last visit she was switched from Norvasc to Cozaar as she felt as though Cozaar was causing palpitations. She has been monitoring her BP at home, her home chart shows BP readings between 110/70 - 170/90 with most of her readings being above 140/80. She continues to complain of episodes of palpitations and was in the ER 5 days ago for atypical chest pain.  EKG was negative as well as the initial troponin BC and BMP normal.  She was given a GI cocktail which completely resolved her symptoms.   Used to complain of migraine headaches which she is getting approximately 4 headaches a week.  She is taking a lot of Motrin sometimes 8-200 mg capsules a day.  She has been on atenolol in the past but did not like the way it made her feel as she reports "it made me feel like I was in a fog", but did help with her migraine headaches and reducing the number and duration.  Rested in trying something new as well as seeing neurology about her migraine headaches.  Pains of urinary frequency, she is unsure if this is due to diuretic use or if she has a urinary tract infection.  Her only complaint is of frequency she denies any dysuria,  Hematuria, incomplete bladder emptying, or low back pain.  Have a urinalysis done today to check to make sure she does not have a UTI  Review of Systems See HPI   Past Medical History:  Diagnosis Date  . Enlarged tonsils   . Herpes   . Hypertension   . Migraines   . Morbid obesity ([place])     Social History   Socioeconomic History  . Marital status: Married    Spouse name: Not on file  . Number of children: Not on file  . Years of education: Not on file    . Highest education level: Not on file  Social Needs  . Financial resource strain: Not on file  . Food insecurity - worry: Not on file  . Food insecurity - inability: Not on file  . Transportation needs - medical: Not on file  . Transportation needs - non-medical: Not on file  Occupational History  . Not on file  Tobacco Use  . Smoking status: Never Smoker  . Smokeless tobacco: Never Used  Substance and Sexual Activity  . Alcohol use: No  . Drug use: No  . Sexual activity: Yes    Birth control/protection: Surgical  Other Topics Concern  . 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 TUBAL LIGATION      Family History  Problem Relation Age of Onset  . Heart attack Mother 62  . Hypertension Father   . Heart attack Father 26  . Hypertension Sister   . Hypertension Brother   . Arrhythmia Brother   . Hypertension Sister     No Known Allergies  Current Outpatient Medications on File Prior to Visit  Medication Sig Dispense Refill  . acyclovir (ZOVIRAX) 400 MG tablet Take 1 tablet (400 mg total) by mouth 2 (two) times daily. 180 tablet 3  . aspirin EC 81 MG tablet Take 81 mg by mouth daily.    . hydrochlorothiazide (HYDRODIURIL) 25 MG tablet TAKE 1 TABLET BY MOUTH daily 30 tablet 0  . ibuprofen (ADVIL,MOTRIN) 200 MG tablet Take 600-800 mg by mouth every 6 (six) hours as needed for headache or mild pain.    Marland Kitchen losartan (COZAAR) 50 MG tablet Take 1 tablet (50 mg total) by mouth daily. 30 tablet 2  . omeprazole (PRILOSEC) 20 MG capsule Take 1 capsule (20 mg total) by mouth daily. 90 capsule 3   No current facility-administered medications on file prior to visit.     BP 140/60 (BP Location: Right Arm, Patient Position: Sitting)   Temp 98.3 F (36.8 C) (Oral)   Ht 5\' 3"  (1.6 m)   Wt 239 lb 1.6 oz (108.5 kg)   LMP 07/24/2017    BMI 42.35 kg/m       Objective:   Physical Exam  Constitutional: She is oriented to person, place, and time. She appears well-developed and well-nourished. No distress.  Neck: Normal range of motion. Neck supple. No thyromegaly present.  Cardiovascular: Normal rate, regular rhythm, normal heart sounds and intact distal pulses. Exam reveals no gallop and no friction rub.  No murmur heard. Pulmonary/Chest: Effort normal and breath sounds normal. No respiratory distress. She has no wheezes. She has no rales.  Abdominal: Soft. Normal appearance. There is no CVA tenderness.  Musculoskeletal: Normal range of motion. She exhibits no edema, tenderness or deformity.  Lymphadenopathy:    She has no cervical adenopathy.  Neurological: She is alert and oriented to person, place, and time. She displays normal reflexes. No cranial nerve deficit. She exhibits normal muscle tone. Coordination normal.  Skin: Skin is warm and dry. No rash noted. She is not diaphoretic. No erythema. No pallor.  Psychiatric: She has a normal mood and affect. Her behavior is normal. Thought content normal.  Nursing note and vitals reviewed.     Assessment & Plan:  1. Essential hypertension - Better controlled but not at goal. Will increase to 75 mg daily. Continue to monitor BP at home and follow up if not at goal  - losartan (COZAAR) 50 MG tablet; Take 1.5 tablets (75 mg total) by mouth daily.  Dispense: 135 tablet; Refill: 1  2. Other migraine with status migrainosus, intractable - amitriptyline (ELAVIL) 10 MG tablet; Take 1 tablet (10 mg total) by mouth at bedtime.  Dispense: 30 tablet; Refill: 3 - Ambulatory referral to Neurology - Stop Motrin   3. Urinary frequency - POC Urinalysis Dipstick - Will treat if needed  Janet Peng, NP

## 2017-08-09 ENCOUNTER — Other Ambulatory Visit: Payer: Self-pay | Admitting: Family Medicine

## 2017-08-09 DIAGNOSIS — I10 Essential (primary) hypertension: Secondary | ICD-10-CM

## 2017-08-09 MED ORDER — HYDROCHLOROTHIAZIDE 25 MG PO TABS: 25.0000 mg | ORAL_TABLET | Freq: Every day | ORAL | 0 refills | Status: DC

## 2017-08-09 NOTE — Telephone Encounter (Signed)
Sent to the pharmacy by e-scribe. 

## 2017-08-09 NOTE — Telephone Encounter (Signed)
Ok to refill for 90 days  

## 2017-08-09 NOTE — Telephone Encounter (Signed)
Last cpx 06/2016 Seen recently for HTN on 08/07/17 Please advise.

## 2017-08-09 NOTE — Telephone Encounter (Signed)
Which medication ?

## 2017-08-16 ENCOUNTER — Ambulatory Visit: Payer: Self-pay | Admitting: *Deleted

## 2017-08-16 NOTE — Telephone Encounter (Signed)
[facility]- she can continue to monitor and follow up on the 29th unless it is above 160/90 consistently.

## 2017-08-16 NOTE — Telephone Encounter (Signed)
[facility] - If pt needs to be seen sooner please advise. Thanks!

## 2017-08-16 NOTE — Telephone Encounter (Signed)
Called in c/o her BP being high.  She is taking it with a wrist monitor and getting very varied readings depending on how she holds her arm. With her arm down it's 140/80. With arm up it's 171/112 with the readings being taken right after the other. She doesn't have a way to get her BP checked with a cuff presently.   She is at work. She is c/o a headache but no other symptoms.  She woke up with a headache this morning that she has taken 2 Ibuprofen at 2:00am, 2 more Ibuprofen when she got up.   She also took 2 Excedrin.  I instructed her to call us back or go to the ED if her BP remains high,  I encouraged her to go have it checked at the drug store, etc if she could.   I made her an appt with [name], AGNP-C for Tuesday Jan. 29th, 2019 at 1:45PM.   I instructed her to call us back sooner if she needed [place].   She verbalized understanding.      Reason for Disposition . Systolic BP  >= 630 OR Diastolic >= 160  Answer Assessment - Initial Assessment Questions 1. BLOOD PRESSURE: "What is the blood pressure?" "Did you take at least two measurements 5 minutes apart?"     171/112 this morning, then 140/80,  2. ONSET: "When did you take your blood pressure?"     171/112 just now 3. HOW: "How did you obtain the blood pressure?" (e.g., visiting nurse, automatic home BP monitor)     I have a wrist monitor.  One time my arm is up and the other time my arm is down.   4. HISTORY: "Do you have a history of high blood pressure?"     Yes.  I'm taking my medications. 5. MEDICATIONS: "Are you taking any medications for blood pressure?" "Have you missed any doses recently?"     I have not missed any doses. 6. OTHER SYMPTOMS: "Do you have any symptoms?" (e.g., headache, chest pain, blurred vision, difficulty breathing, weakness)     Headache.  I've taken 2 Ibuprofen at 2am and 2 more Ibuprofen.  I also took 2 Excedrin.   I took a spoonful of mustard to help my BP.   I took a Prilosec too.   I was feeling  irritability on the left side of my chest.  It felt like gas.  I don't have any tightness.  The Prilosec helped.   My vision is ok today.   I'm able to drive.    7. PREGNANCY: "Is there any chance you are pregnant?" "When was your last menstrual period?"     I doubt it.   My tubes are tied.  Protocols used: HIGH BLOOD PRESSURE-A-AH

## 2017-08-21 ENCOUNTER — Encounter: Payer: Self-pay | Admitting: Adult Health

## 2017-08-21 ENCOUNTER — Ambulatory Visit (INDEPENDENT_AMBULATORY_CARE_PROVIDER_SITE_OTHER): Payer: BLUE CROSS/BLUE SHIELD | Admitting: Adult Health

## 2017-08-21 VITALS — BP 148/98 | Temp 98.3°F | Wt 239.0 lb

## 2017-08-21 DIAGNOSIS — I10 Essential (primary) hypertension: Secondary | ICD-10-CM | POA: Diagnosis not present

## 2017-08-21 DIAGNOSIS — G43819 Other migraine, intractable, without status migrainosus: Secondary | ICD-10-CM

## 2017-08-21 MED ORDER — IBUPROFEN 200 MG PO TABS: 600.0000 mg | ORAL_TABLET | Freq: Four times a day (QID) | ORAL | 1 refills | Status: DC | PRN

## 2017-08-21 MED ORDER — LISINOPRIL 40 MG PO TABS: 40.0000 mg | ORAL_TABLET | Freq: Every day | ORAL | 1 refills | Status: DC

## 2017-08-21 NOTE — Progress Notes (Signed)
Subjective:    Patient ID: Janet Sanchez, female    DOB: Nov 13, 1970, 47 y.o.   MRN: [id number]  HPI  47 year old female who  has a past medical history of Enlarged tonsils, Herpes, Hypertension, Migraines, and Morbid obesity ([place]). She presents to the office today for follow up regarding hypertension and migraines. When I last saw her about two weeks ago, I increased Cozaar from 50 to 75 mg due to home readings consistently above 140/80. She has continues to monitor her BP at home and reports readings consistently above 140/90 with many readings now in the 160's/110.   She continues to have migraines - on her last visit Elavil 10 mg was prescribed. She has not been paying attention to see if her headaches have improved.    Review of Systems   See HPI   Past Medical History:  Diagnosis Date  . Enlarged tonsils   . Herpes   . Hypertension   . Migraines   . Morbid obesity ([place])     Social History   Socioeconomic History  . Marital status: Married    Spouse name: Not on file  . Number of children: Not on file  . Years of education: Not on file  . Highest education level: Not on file  Social Needs  . Financial resource strain: Not on file  . Food insecurity - worry: Not on file  . Food insecurity - inability: Not on file  . Transportation needs - medical: Not on file  . Transportation needs - non-medical: Not on file  Occupational History  . Not on file  Tobacco Use  . Smoking status: Never Smoker  . Smokeless tobacco: Never Used  Substance and Sexual Activity  . Alcohol use: No  . Drug use: No  . Sexual activity: Yes    Birth control/protection: Surgical  Other Topics Concern  . 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 TUBAL LIGATION      Family History  Problem Relation Age of Onset  . Heart attack  Mother 23  . Hypertension Father   . Heart attack Father 64  . Hypertension Sister   . Hypertension Brother   . Arrhythmia Brother   . Hypertension Sister     No Known Allergies  Current Outpatient Medications on File Prior to Visit  Medication Sig Dispense Refill  . acyclovir (ZOVIRAX) 400 MG tablet Take 1 tablet (400 mg total) by mouth 2 (two) times daily. 180 tablet 3  . amitriptyline (ELAVIL) 10 MG tablet Take 1 tablet (10 mg total) by mouth at bedtime. 30 tablet 3  . aspirin EC 81 MG tablet Take 81 mg by mouth daily.    . hydrochlorothiazide (HYDRODIURIL) 25 MG tablet Take 1 tablet (25 mg total) by mouth daily. 90 tablet 0  . ibuprofen (ADVIL,MOTRIN) 200 MG tablet Take 600-800 mg by mouth every 6 (six) hours as needed for headache or mild pain.    Marland Kitchen losartan (COZAAR) 50 MG tablet Take 1.5 tablets (75 mg total) by mouth daily. 135 tablet 1  . omeprazole (PRILOSEC) 20 MG capsule Take 1 capsule (20 mg total) by mouth daily. 90 capsule 3   No current facility-administered medications on file prior to visit.     BP (!) 148/98 (BP Location: Left  Arm)   Temp 98.3 F (36.8 C) (Oral)   Wt 239 lb (108.4 kg)   LMP 07/24/2017   BMI 42.34 kg/m       Objective:   Physical Exam  Constitutional: She is oriented to person, place, and time. She appears well-developed and well-nourished. No distress.  Eyes: Conjunctivae and EOM are normal. Pupils are equal, round, and reactive to light. Right eye exhibits no discharge. Left eye exhibits no discharge. No scleral icterus.  Cardiovascular: Normal rate, regular rhythm and intact distal pulses. Exam reveals no gallop and no friction rub.  No murmur heard. Pulmonary/Chest: Effort normal and breath sounds normal. No respiratory distress. She has no wheezes. She has no rales. She exhibits no tenderness.  Abdominal: Soft. Bowel sounds are normal. She exhibits no distension and no mass. There is no tenderness. There is no rebound and no guarding.    Musculoskeletal: Normal range of motion.  Neurological: She is alert and oriented to person, place, and time.  Skin: Skin is warm and dry. No rash noted. No erythema. No pallor.  Psychiatric: She has a normal mood and affect. Her behavior is normal. Judgment and thought content normal.  Nursing note and vitals reviewed.     Assessment & Plan:  1. Essential hypertension - Will switch from Cozaar to Lisinopril.  - lisinopril (PRINIVIL,ZESTRIL) 40 MG tablet; Take 1 tablet (40 mg total) by mouth daily.  Dispense: 30 tablet; Refill: 1 - Continue to monitor BP at home  - Follow up in 2 weeks or sooner if needed 2. Other migraine without status migrainosus, intractable - Asked to keep a migraine diary  - ibuprofen (ADVIL,MOTRIN) 200 MG tablet; Take 3 tablets (600 mg total) by mouth every 6 (six) hours as needed for headache or mild pain.  Dispense: 90 tablet; Refill: 1 - Follow up in 2 weeks   [name], NP

## 2017-08-28 ENCOUNTER — Encounter: Payer: Self-pay | Admitting: Family Medicine

## 2017-08-28 ENCOUNTER — Ambulatory Visit: Payer: Self-pay | Admitting: *Deleted

## 2017-08-28 ENCOUNTER — Telehealth: Payer: Self-pay | Admitting: Adult Health

## 2017-08-28 ENCOUNTER — Ambulatory Visit (INDEPENDENT_AMBULATORY_CARE_PROVIDER_SITE_OTHER): Payer: BLUE CROSS/BLUE SHIELD | Admitting: Family Medicine

## 2017-08-28 VITALS — BP 112/88 | HR 100 | Temp 103.0°F | Ht 63.0 in | Wt 236.5 lb

## 2017-08-28 DIAGNOSIS — J111 Influenza due to unidentified influenza virus with other respiratory manifestations: Secondary | ICD-10-CM

## 2017-08-28 DIAGNOSIS — J029 Acute pharyngitis, unspecified: Secondary | ICD-10-CM

## 2017-08-28 LAB — POCT RAPID STREP A (OFFICE): Rapid Strep A Screen: NEGATIVE

## 2017-08-28 LAB — POC INFLUENZA A&B (BINAX/QUICKVUE)
Influenza A, POC: NEGATIVE
Influenza B, POC: NEGATIVE

## 2017-08-28 NOTE — Telephone Encounter (Signed)
Copied from [place] [phone]. Topic: Quick Communication - See Telephone Encounter >> Aug 28, 2017  5:12 PM Cleaster Corin, NT wrote: CRM for notification. See Telephone encounter for:   08/28/17. Pt. Needs a doctors note about visit today 08-28-2017. Would like it mailed to her house. Pt. Can be reached at [phone]

## 2017-08-28 NOTE — Telephone Encounter (Signed)
Copied from [place] [phone]. Topic: Quick Communication - Office Called Patient >> Aug 28, 2017  5:43 PM Agnes Lawrence, CMA wrote: Reason for CRM: need specific details of what pt needs for a note  >> Aug 28, 2017  5:58 PM Neva Seat wrote: Details for note:  Date and time of visit Dr's diganosis - reason for being out of work When pt can return to work   Please call pt when it's been written and mailed to pt.  [phone]

## 2017-08-28 NOTE — Telephone Encounter (Signed)
Pt called with complaints of chills that started yesterday; temp 103.2 to 102.5; cough, and chest pain when coughing, nasal congestion that started yesterday; her temp this morning is 102.9 and she also compliaints of tension in her head ; per nurse triage protocol recommendation made for pt to see physician within 24 hours; pt requested appointment with [name] at about 0930 because she has to pick her son up form school at 5; no appointmens available at that time; a pt offered and accepted appointment with Dr Colin Benton at  [phone]; pt verbalizes understanding.  Reason for Disposition . [1] Continuous (nonstop) coughing interferes with work or school AND [2] no improvement using cough treatment per protocol  Answer Assessment - Initial Assessment Questions 1. TEMPERATURE: "What is the most recent temperature?"  "How was it measured?"      102.9 digital thermometer; taken axillaty  2. ONSET: "When did the fever start?"      08/28/17 3. SYMPTOMS: "Do you have any other symptoms besides the fever?"  (e.g., colds, headache, sore throat, earache, cough, rash, diarrhea, vomiting, abdominal pain)     Cold, hadache, chest pain when coughing 4. CAUSE: If there are no symptoms, ask: "What do you think is causing the fever?"      unsure 5. CONTACTS: "Does anyone else in the family have an infection?"     Yes husband is sick 38. TREATMENT: "What have you done so far to treat this fever?" (e.g., medications)     Nyquil; excedrin for headache 7. IMMUNOCOMPROMISE: "Do you have of the following: diabetes, HIV positive, splenectomy, cancer chemotherapy, chronic steroid treatment, transplant patient, etc."     no 8. PREGNANCY: "Is there any chance you are pregnant?" "When was your last menstrual period?"     No tubal ligation 9. TRAVEL: "Have you traveled out of the country in the last month?" (e.g., travel history, exposures)     no  Answer Assessment - Initial Assessment Questions 1. ONSET: "When did  the cough begin?"      08/27/17 2. SEVERITY: "How bad is the cough today?"      Rated 7 out of 10 3. RESPIRATORY DISTRESS: "Describe your breathing."      ok 4. FEVER: "Do you have a fever?" If so, ask: "What is your temperature, how was it measured, and when did it start?"     102.9 axillary wit digital thermometer 5. HEMOPTYSIS: "Are you coughing up any blood?" If so ask: "How much?" (flecks, streaks, tablespoons, etc.)     no 6. TREATMENT: "What have you done so far to treat the cough?" (e.g., meds, fluids, humidifier)     nyquil 7. CARDIAC HISTORY: "Do you have any history of heart disease?" (e.g., heart attack, congestive heart failure)      no 8. LUNG HISTORY: "Do you have any history of lung disease?"  (e.g., pulmonary embolus, asthma, emphysema)     no 9. PE RISK FACTORS: "Do you have a history of blood clots?" (or: recent major surgery, recent prolonged travel, bedridden )     no 10. OTHER SYMPTOMS: "Do you have any other symptoms? (e.g., runny nose, wheezing, chest pain)       Chills,, chest pain when coughing, nasal congestion  11. PREGNANCY: "Is there any chance you are pregnant?" "When was your last menstrual period?"       No tubal ligation 12. TRAVEL: "Have you traveled out of the country in the last month?" (e.g., travel history, exposures)  no  Protocols used: COUGH - ACUTE NON-PRODUCTIVE-A-AH, FEVER-A-AH

## 2017-08-28 NOTE — Telephone Encounter (Signed)
I left a detailed message asking that the pt call back to let us know if she needs a note stating she was here, to be out of work or what was needed for the visit.

## 2017-08-28 NOTE — Patient Instructions (Signed)

## 2017-08-28 NOTE — Progress Notes (Signed)
HPI:  Acute visit for respiratory illness: -started:yesterday -symptoms:nasal congestion, sore throat, cough, fever, sneezing, body aches -denies:SOB, NVD, tooth pain, severe head ache or neck stiffness, tick bite of foreign travel -has tried: nothing -sick contacts/travel/risks: no reported flu, strep or tick exposure - husband and coworkers with the same  ROS: See pertinent positives and negatives per HPI.  Past Medical History:  Diagnosis Date  . Enlarged tonsils   . Herpes   . Hypertension   . Migraines   . Morbid obesity ([place])     Past Surgical History:  Procedure Laterality Date  . TUBAL LIGATION      Family History  Problem Relation Age of Onset  . Heart attack Mother 32  . Hypertension Father   . Heart attack Father 48  . Hypertension Sister   . Hypertension Brother   . Arrhythmia Brother   . Hypertension Sister     Social History   Socioeconomic History  . Marital status: Married    Spouse name: None  . Number of children: None  . Years of education: None  . Highest education level: None  Social Needs  . Financial resource strain: None  . Food insecurity - worry: None  . Food insecurity - inability: None  . Transportation needs - medical: None  . Transportation needs - non-medical: None  Occupational History  . None  Tobacco Use  . Smoking status: Never Smoker  . Smokeless tobacco: Never Used  Substance and Sexual Activity  . Alcohol use: No  . Drug use: No  . Sexual activity: Yes    Birth control/protection: Surgical  Other Topics Concern  . 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  acyclovir (ZOVIRAX) 400 MG tablet, Take 1 tablet (400 mg total) by mouth 2 (two) times daily., Disp: 180 tablet, Rfl: 3 .  amitriptyline (ELAVIL) 10 MG tablet, Take 1 tablet (10 mg total) by mouth at bedtime., Disp: 30 tablet, Rfl:  3 .  aspirin EC 81 MG tablet, Take 81 mg by mouth daily., Disp: , Rfl:  .  hydrochlorothiazide (HYDRODIURIL) 25 MG tablet, Take 1 tablet (25 mg total) by mouth daily., Disp: 90 tablet, Rfl: 0 .  ibuprofen (ADVIL,MOTRIN) 200 MG tablet, Take 3 tablets (600 mg total) by mouth every 6 (six) hours as needed for headache or mild pain., Disp: 90 tablet, Rfl: 1 .  lisinopril (PRINIVIL,ZESTRIL) 40 MG tablet, Take 1 tablet (40 mg total) by mouth daily., Disp: 30 tablet, Rfl: 1 .  omeprazole (PRILOSEC) 20 MG capsule, Take 1 capsule (20 mg total) by mouth daily., Disp: 90 capsule, Rfl: 3  EXAM:  Vitals:   08/28/17 0951  BP: 112/88  Temp: (!) 103 F (39.4 C)  SpO2: 98%    Body mass index is 41.89 kg/m.  GENERAL: vitals reviewed and listed above, alert, oriented, appears well hydrated and in no acute distress  HEENT: atraumatic, conjunttiva clear, no obvious abnormalities on inspection of external nose and ears, normal appearance of ear canals and TMs, clear nasal congestion, mild post oropharyngeal erythema with PND, 1-2+tonsillar edema without exudate, no sinus TTP  NECK: no obvious masses on inspection  LUNGS: clear to auscultation bilaterally, no wheezes, rales or rhonchi, good air movement  CV: HRRR, no peripheral edema  MS: moves all extremities without noticeable abnormality  PSYCH:  pleasant and cooperative, no obvious depression or anxiety  ASSESSMENT AND PLAN:  Discussed the following assessment and plan:  Influenza  We discussed potential/likely etiologies, with influenza being likely or possible vs. viral infection or other. We discussed risks/benefits/indications/best timing for tamiflu, symptomatic care, likely course, transmission, potential complications, signs of developing a serious illness and return and emergency precuations. She declined tamiflu (offered) and prefers to treat symptomatically. Feels she can tolerate oral intake fluids well. -rapid flu and strep neg,  strep culture pending -of course, we advised to return or notify a doctor immediately if symptoms worsen or persist or new concerns arise.    Patient Instructions  Influenza, Adult Influenza ("the flu") is an infection in the lungs, nose, and throat (respiratory tract). It is caused by a virus. The flu causes many common cold symptoms, as well as a high fever and body aches. It can make you feel very sick. The flu spreads easily from person to person (is contagious). Getting a flu shot (influenza vaccination) every year is the best way to prevent the flu. Follow these instructions at home:  Take over-the-counter and prescription medicines only as told by your doctor.  Use a cool mist humidifier to add moisture (humidity) to the air in your home. This can make it easier to breathe.  Rest as needed.  Drink enough fluid to keep your pee (urine) clear or pale yellow.  Cover your mouth and nose when you cough or sneeze.  Wash your hands with soap and water often, especially after you cough or sneeze. If you cannot use soap and water, use hand sanitizer.  Stay home from work or school as told by your doctor. Unless you are visiting your doctor, try to avoid leaving home until your fever has been gone for 24 hours without the use of medicine.  Keep all follow-up visits as told by your doctor. This is important. How is this prevented?  Getting a yearly (annual) flu shot is the best way to avoid getting the flu. You may get the flu shot in late summer, fall, or winter. Ask your doctor when you should get your flu shot.  Wash your hands often or use hand sanitizer often.  Avoid contact with people who are sick during cold and flu season.  Eat healthy foods.  Drink plenty of fluids.  Get enough sleep.  Exercise regularly. Contact a doctor if:  You get new symptoms.  You have: ? Chest pain. ? Watery poop (diarrhea). ? A fever.  Your cough gets worse.  You start to have more  mucus.  You feel sick to your stomach (nauseous).  You throw up (vomit). Get help right away if:  You start to be short of breath or have trouble breathing.  Your skin or nails turn a bluish color.  You have very bad pain or stiffness in your neck.  You get a sudden headache.  You get sudden pain in your face or ear.  You cannot stop throwing up. This information is not intended to replace advice given to you by your health care provider. Make sure you discuss any questions you have with your health care provider. Document Released: 04/18/2008 Document Revised: 12/16/2015 Document Reviewed: 05/04/2015 Elsevier Interactive Patient Education  2017 [place], DO

## 2017-08-30 ENCOUNTER — Emergency Department (HOSPITAL_BASED_OUTPATIENT_CLINIC_OR_DEPARTMENT_OTHER)
Admission: EM | Admit: 2017-08-30 | Discharge: 2017-08-30 | Disposition: A | Discharge status: Home / Self Care | Payer: BLUE CROSS/BLUE SHIELD | Attending: Emergency Medicine | Admitting: Emergency Medicine

## 2017-08-30 ENCOUNTER — Encounter: Payer: Self-pay | Admitting: *Deleted

## 2017-08-30 ENCOUNTER — Other Ambulatory Visit: Payer: Self-pay

## 2017-08-30 ENCOUNTER — Encounter (HOSPITAL_BASED_OUTPATIENT_CLINIC_OR_DEPARTMENT_OTHER): Payer: Self-pay

## 2017-08-30 ENCOUNTER — Emergency Department (HOSPITAL_BASED_OUTPATIENT_CLINIC_OR_DEPARTMENT_OTHER): Payer: BLUE CROSS/BLUE SHIELD

## 2017-08-30 DIAGNOSIS — I10 Essential (primary) hypertension: Secondary | ICD-10-CM | POA: Insufficient documentation

## 2017-08-30 DIAGNOSIS — Z79899 Other long term (current) drug therapy: Secondary | ICD-10-CM | POA: Insufficient documentation

## 2017-08-30 DIAGNOSIS — R509 Fever, unspecified: Secondary | ICD-10-CM | POA: Diagnosis present

## 2017-08-30 DIAGNOSIS — J111 Influenza due to unidentified influenza virus with other respiratory manifestations: Secondary | ICD-10-CM | POA: Diagnosis not present

## 2017-08-30 LAB — CULTURE, GROUP A STREP
MICRO NUMBER: 90153963
SPECIMEN QUALITY: ADEQUATE

## 2017-08-30 MED ORDER — IBUPROFEN 400 MG PO TABS
600.0000 mg | ORAL_TABLET | Freq: Once | ORAL | Status: AC
  Administered 2017-08-30: 600 mg via ORAL
  Filled 2017-08-30: qty 1

## 2017-08-30 MED ORDER — IBUPROFEN 800 MG PO TABS: 800.0000 mg | ORAL_TABLET | Freq: Three times a day (TID) | ORAL | 0 refills | Status: DC

## 2017-08-30 MED ORDER — ACETAMINOPHEN 500 MG PO TABS
1000.0000 mg | ORAL_TABLET | Freq: Once | ORAL | Status: AC
  Administered 2017-08-30: 1000 mg via ORAL
  Filled 2017-08-30: qty 2

## 2017-08-30 NOTE — Telephone Encounter (Signed)
I left a message for the pt to return my call. 

## 2017-08-30 NOTE — ED Notes (Signed)
Pt sat up in bed without issue in order to take her medication despite complaints of "stiffness."  Pt requesting warm blanket, gave patient a regular blanket as she checked in for "sweating" and encouraged her to allow the ibuprofen to work, and we did not want to cook her should she run a fever.  Turned lights off, tucked pt in and husband is at the bedside.

## 2017-08-30 NOTE — Telephone Encounter (Signed)
Ok to write note she requests. Diagnosis is influenza like illness. Advise return to work ok when fever and symptoms resolved for 24-48 hours. 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

## 2017-08-30 NOTE — ED Provider Notes (Signed)
[facility] EMERGENCY DEPARTMENT Provider Note   CSN: [id number] Arrival date & time: 08/30/17  9562     History   Chief Complaint Chief Complaint  Patient presents with  . Influenza    HPI Janet Sanchez is a 47 y.o. female.  The history is provided by the patient.  Influenza  Presenting symptoms: cough, fever and myalgias   Presenting symptoms: no fatigue, no headaches, no nausea, no rhinorrhea, no shortness of breath and no vomiting   Severity:  Moderate Onset quality:  Gradual Progression:  Unchanged Chronicity:  New Relieved by:  Nothing Worsened by:  Nothing Ineffective treatments:  None tried Associated symptoms: no mental status change, no neck stiffness and no syncope   Risk factors: not elderly   Diagnosed with Influenza on Tuesday and is still having symptoms and wants to know if this is normal.    Past Medical History:  Diagnosis Date  . Enlarged tonsils   . Herpes   . Hypertension   . Migraines   . Morbid obesity [facility])     Patient Active Problem List   Diagnosis Date Noted  . GERD (gastroesophageal reflux disease) 07/10/2017  . Morbid obesity ([place]) 11/19/2013  . DUB (dysfunctional uterine bleeding) 05/08/2013  . Fibroid uterus 03/01/2011  . HERPETIC VULVOVAGINITIS 04/30/2007  . HSV 04/30/2007  . CARPAL TUNNEL SYNDROME, RIGHT 04/30/2007  . Essential hypertension 04/30/2007  . HYPERTROPHY, TONSILS 04/30/2007  . LOW BACK PAIN SYNDROME 04/30/2007    Past Surgical History:  Procedure Laterality Date  . TUBAL LIGATION      OB History    Gravida Para Term Preterm AB Living   10 5 4 1 5 5    SAB TAB Ectopic Multiple Live Births   5               Home Medications    Prior to Admission medications   Medication Sig Start Date End Date Taking? Authorizing Provider  acyclovir (ZOVIRAX) 400 MG tablet Take 1 tablet (400 mg total) by mouth 2 (two) times daily. 03/20/17   Nafziger, Tommi Rumps, NP  amitriptyline (ELAVIL) 10 MG tablet Take  1 tablet (10 mg total) by mouth at bedtime. 08/07/17   Nafziger, Tommi Rumps, NP  aspirin EC 81 MG tablet Take 81 mg by mouth daily.    [provider]  hydrochlorothiazide (HYDRODIURIL) 25 MG tablet Take 1 tablet (25 mg total) by mouth daily. 08/09/17   Nafziger, Tommi Rumps, NP  ibuprofen (ADVIL,MOTRIN) 200 MG tablet Take 3 tablets (600 mg total) by mouth every 6 (six) hours as needed for headache or mild pain. 08/21/17 11/19/17  Nafziger, Tommi Rumps, NP  ibuprofen (ADVIL,MOTRIN) 800 MG tablet Take 1 tablet (800 mg total) by mouth 3 (three) times daily. 08/30/17   Kiptyn Rafuse, MD  lisinopril (PRINIVIL,ZESTRIL) 40 MG tablet Take 1 tablet (40 mg total) by mouth daily. 08/21/17   Nafziger, Tommi Rumps, NP  omeprazole (PRILOSEC) 20 MG capsule Take 1 capsule (20 mg total) by mouth daily. 07/10/17   [name], NP    Family History Family History  Problem Relation Age of Onset  . Heart attack Mother 76  . Hypertension Father   . Heart attack Father 59  . Hypertension Sister   . Hypertension Brother   . Arrhythmia Brother   . Hypertension Sister     Social History Social History   Tobacco Use  . Smoking status: Never Smoker  . Smokeless tobacco: Never Used  Substance Use Topics  . Alcohol use: No  .  Drug use: No     Allergies   Patient has no known allergies.   Review of Systems Review of Systems  Constitutional: Positive for fever. Negative for fatigue.  HENT: Negative for drooling, rhinorrhea, trouble swallowing and voice change.   Eyes: Negative for photophobia.  Respiratory: Positive for cough. Negative for shortness of breath.   Gastrointestinal: Negative for nausea and vomiting.  Musculoskeletal: Positive for myalgias. Negative for neck pain and neck stiffness.  Neurological: Negative for headaches.  All other systems reviewed and are negative.    Physical Exam Updated Vital Signs BP 104/66 (BP Location: Right Arm)   Pulse 81   Temp 99.4 F (37.4 C) (Oral)   Resp 20   Ht 5\' 3"   (1.6 m)   Wt 107 kg (236 lb)   LMP 08/20/2017   SpO2 99%   BMI 41.81 kg/m   Physical Exam  Constitutional: She is oriented to person, place, and time. She appears well-developed and well-nourished. No distress.  HENT:  Head: Normocephalic and atraumatic.  Mouth/Throat: No oropharyngeal exudate.  Eyes: Conjunctivae are normal. Pupils are equal, round, and reactive to light.  Neck: Normal range of motion. Neck supple. No tracheal tenderness present. No neck rigidity. No erythema and normal range of motion present.  Cardiovascular: Normal rate, regular rhythm, normal heart sounds and intact distal pulses.  Pulmonary/Chest: Effort normal and breath sounds normal. No stridor. She has no wheezes. She has no rales.  Abdominal: Soft. Bowel sounds are normal. She exhibits no mass. There is no tenderness. There is no rebound and no guarding.  Musculoskeletal: Normal range of motion.  Lymphadenopathy:    She has no cervical adenopathy.  Neurological: She is alert and oriented to person, place, and time. She displays normal reflexes.  Skin: Skin is warm and dry. Capillary refill takes less than 2 seconds.  Psychiatric: She has a normal mood and affect.  Nursing note and vitals reviewed.    ED Treatments / Results   Vitals:   08/30/17 0250  BP: 104/66  Pulse: 81  Resp: 20  Temp: 99.4 F (37.4 C)  SpO2: 99%    Radiology Dg Chest 2 View  Result Date: 08/30/2017 CLINICAL DATA:  Acute onset of generalized body aches and fatigue. EXAM: CHEST  2 VIEW COMPARISON:  Chest radiograph performed 08/02/2017 FINDINGS: The lungs are well-aerated. Minimal left basilar atelectasis is noted. There is no evidence of pleural effusion or pneumothorax. The heart is normal in size; the mediastinal contour is within normal limits. No acute osseous abnormalities are seen. IMPRESSION: Minimal left basilar atelectasis.  Lungs otherwise clear. Electronically Signed   By: Garald Balding M.D.   On: 08/30/2017 04:39     Procedures Procedures (including critical care time)  Medications Ordered in ED Medications  ibuprofen (ADVIL,MOTRIN) tablet 600 mg (600 mg Oral Given 08/30/17 0305)  acetaminophen (TYLENOL) tablet 1,000 mg (1,000 mg Oral Given 08/30/17 0407)       Final Clinical Impressions(s) / ED Diagnoses   Final diagnoses:  Influenza   Alternate Tylenol and Ibuprofen for pain.   Return for weakness, numbness, changes in vision or speech,  fevers > 100.4 unrelieved by medication, shortness of breath, intractable vomiting, or diarrhea, abdominal pain, Inability to tolerate liquids or food, cough, altered mental status or any concerns. No signs of systemic illness or infection. The patient is nontoxic-appearing on exam and vital signs are within normal limits.    I have reviewed the triage vital signs and the nursing notes.  Pertinent labs &imaging results that were available during my care of the patient were reviewed by me and considered in my medical decision making (see chart for details).  After history, exam, and medical workup I feel the patient has been appropriately medically screened and is safe for discharge home. Pertinent diagnoses were discussed with the patient. Patient was given return precautions.  ED Discharge Orders        Ordered    ibuprofen (ADVIL,MOTRIN) 800 MG tablet  3 times daily     08/30/17 0454       Zerrick Hanssen, MD 08/30/17 5885

## 2017-08-30 NOTE — Telephone Encounter (Signed)
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  Patient is aware the work note was completed and mailed to her home address.

## 2017-08-30 NOTE — ED Triage Notes (Signed)
Pt c/o fatigue, and body aches, diagnosed with the flu on 2/5, able to hold down fluids without issue, last took ibuprofen at 1500 on 2/6.  Pt is able to touch her chin to her chest, in no acute distress, speaking in full sentences and ambulated back to room without assistance or difficulty.  Pt denies any new symptoms since being seen on 2/5 by primary physician.

## 2017-09-04 ENCOUNTER — Ambulatory Visit (INDEPENDENT_AMBULATORY_CARE_PROVIDER_SITE_OTHER): Payer: BLUE CROSS/BLUE SHIELD | Admitting: Adult Health

## 2017-09-04 ENCOUNTER — Encounter: Payer: Self-pay | Admitting: Adult Health

## 2017-09-04 VITALS — BP 150/94 | Temp 98.3°F | Wt 233.0 lb

## 2017-09-04 DIAGNOSIS — R059 Cough, unspecified: Secondary | ICD-10-CM

## 2017-09-04 DIAGNOSIS — R05 Cough: Secondary | ICD-10-CM | POA: Diagnosis not present

## 2017-09-04 DIAGNOSIS — I10 Essential (primary) hypertension: Secondary | ICD-10-CM

## 2017-09-04 NOTE — Progress Notes (Signed)
Subjective:    Patient ID: Janet Sanchez, female    DOB: 06-30-1971, 47 y.o.   MRN: [id number]  HPI  47 year old female who  has a past medical history of Enlarged tonsils, Herpes, Hypertension, Migraines, and Morbid obesity ([place]).  She presents to the office today for two week follow up regarding hypertension. During her last visit she was switched from Cozaar to lisinopril.. Currently she is taking Lisinopril 40 mg and HCTZ 25 mg. BP on 08/28/2017 was 112/88 and then was seen in the ER for flu like symptoms and her BP was 110/59.   She continues to have a  Dry cough as a residual of her flu like symptoms. She has not been taking any OTC medications.   She checked her blood pressure one time at home and reported reading of 112/74.     Review of Systems See HPI   Past Medical History:  Diagnosis Date  . Enlarged tonsils   . Herpes   . Hypertension   . Migraines   . Morbid obesity ([place])     Social History   Socioeconomic History  . Marital status: Married    Spouse name: Not on file  . Number of children: Not on file  . Years of education: Not on file  . Highest education level: Not on file  Social Needs  . Financial resource strain: Not on file  . Food insecurity - worry: Not on file  . Food insecurity - inability: Not on file  . Transportation needs - medical: Not on file  . Transportation needs - non-medical: Not on file  Occupational History  . Not on file  Tobacco Use  . Smoking status: Never Smoker  . Smokeless tobacco: Never Used  Substance and Sexual Activity  . Alcohol use: No  . Drug use: No  . Sexual activity: Yes    Birth control/protection: Surgical  Other Topics Concern  . 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 TUBAL LIGATION      Family History  Problem Relation Age of Onset  .  Heart attack Mother 45  . Hypertension Father   . Heart attack Father 60  . Hypertension Sister   . Hypertension Brother   . Arrhythmia Brother   . Hypertension Sister     No Known Allergies  Current Outpatient Medications on File Prior to Visit  Medication Sig Dispense Refill  . acyclovir (ZOVIRAX) 400 MG tablet Take 1 tablet (400 mg total) by mouth 2 (two) times daily. 180 tablet 3  . amitriptyline (ELAVIL) 10 MG tablet Take 1 tablet (10 mg total) by mouth at bedtime. 30 tablet 3  . aspirin EC 81 MG tablet Take 81 mg by mouth daily.    . hydrochlorothiazide (HYDRODIURIL) 25 MG tablet Take 1 tablet (25 mg total) by mouth daily. 90 tablet 0  . ibuprofen (ADVIL,MOTRIN) 200 MG tablet Take 3 tablets (600 mg total) by mouth every 6 (six) hours as needed for headache or mild pain. 90 tablet 1  . lisinopril (PRINIVIL,ZESTRIL) 40 MG tablet Take 1 tablet (40 mg total) by mouth daily. 30 tablet 1  . omeprazole (PRILOSEC) 20 MG capsule Take 1 capsule (20 mg total) by mouth daily. 90 capsule 3  . ibuprofen (ADVIL,MOTRIN) 800 MG tablet Take  1 tablet (800 mg total) by mouth 3 (three) times daily. (Patient not taking: Reported on 09/04/2017) 21 tablet 0   No current facility-administered medications on file prior to visit.     BP (!) 150/94 (BP Location: Left Arm)   Temp 98.3 F (36.8 C) (Oral)   Wt 233 lb (105.7 kg)   LMP 08/20/2017   BMI 41.27 kg/m       Objective:   Physical Exam  Constitutional: She is oriented to person, place, and time. She appears well-developed and well-nourished. No distress.  Cardiovascular: Normal rate, regular rhythm, normal heart sounds and intact distal pulses. Exam reveals no gallop and no friction rub.  No murmur heard. Pulmonary/Chest: Effort normal and breath sounds normal. No respiratory distress. She has no wheezes. She has no rales. She exhibits no tenderness.  Neurological: She is alert and oriented to person, place, and time.  Skin: Skin is warm and  dry. No rash noted. She is not diaphoretic. No erythema. No pallor.  Psychiatric: She has a normal mood and affect. Her behavior is normal. Judgment and thought content normal.  Nursing note and vitals reviewed.     Assessment & Plan:  1. Essential hypertension - not improved in the office today but had been improved shortly after taking lisinopril  - I want her to monitor BP BID for one week and send me results via mychart  - Consider change to Norvasc   2. Cough- possibly from Ace or flu like symptoms  - Will reevaluate in one week    [name], NP

## 2017-09-04 NOTE — Patient Instructions (Signed)
Please monitor your blood pressure twice a day for one week   Send me the results via mychart

## 2017-09-07 ENCOUNTER — Encounter: Payer: Self-pay | Admitting: Adult Health

## 2017-09-20 ENCOUNTER — Other Ambulatory Visit: Payer: Self-pay | Admitting: Family Medicine

## 2017-09-20 DIAGNOSIS — I10 Essential (primary) hypertension: Secondary | ICD-10-CM

## 2017-09-21 ENCOUNTER — Encounter: Payer: Self-pay | Admitting: Family Medicine

## 2017-09-21 ENCOUNTER — Encounter: Payer: Self-pay | Admitting: Adult Health

## 2017-09-21 MED ORDER — LISINOPRIL 40 MG PO TABS: 40.0000 mg | ORAL_TABLET | Freq: Every day | ORAL | 0 refills | Status: DC

## 2017-09-21 NOTE — Telephone Encounter (Signed)
Message sent by MyChart.  Waiting on a response.

## 2017-10-10 ENCOUNTER — Encounter: Payer: Self-pay | Admitting: Adult Health

## 2017-10-15 ENCOUNTER — Encounter: Payer: Self-pay | Admitting: Adult Health

## 2017-10-22 ENCOUNTER — Encounter: Payer: Self-pay | Admitting: Adult Health

## 2017-10-23 ENCOUNTER — Encounter: Payer: Self-pay | Admitting: Neurology

## 2017-10-23 ENCOUNTER — Ambulatory Visit: Payer: BLUE CROSS/BLUE SHIELD | Admitting: Neurology

## 2017-10-23 VITALS — BP 116/80 | HR 87 | Resp 16 | Ht 63.0 in | Wt 232.3 lb

## 2017-10-23 DIAGNOSIS — G44229 Chronic tension-type headache, not intractable: Secondary | ICD-10-CM | POA: Diagnosis not present

## 2017-10-23 MED ORDER — AMITRIPTYLINE HCL 25 MG PO TABS: 25.0000 mg | ORAL_TABLET | Freq: Every day | ORAL | 3 refills | Status: DC

## 2017-10-23 NOTE — Progress Notes (Signed)
NEUROLOGY CONSULTATION NOTE  Janet Sanchez MRN: [id number] DOB: 04/09/1971  Referring provider: [name], NP Primary care provider: [name], NP  Reason for consult:  migraines  HISTORY OF PRESENT ILLNESS: Janet Sanchez is a 47 year old female with hypertension, morbid obesity, and herpes who presents for migraines.  History supplemented by PCP and ED notes.  Onset:  teenager Location:  Bi-frontal/temporal Quality:  throbbing Intensity:  moderate.  She denies new headache, thunderclap headache or severe headache that wakes her from sleep. Aura:  no Prodrome:  no Postdrome:  no Associated symptoms:  Some phonophobia.  No nausea, vomiting, photophobia, autonomic symptoms or visual disturbance.  She denies associated unilateral numbness or weakness. Duration:  30 to 60 minutes with ibuprofen Frequency:  3 to 5 days a week Frequency of abortive medication: 3 to 5 days a week Triggers/exacerbating factors:  Elevated blood pressure, stress Relieving factors:  Massaging temples Activity:  Does not aggrvate  Current NSAIDS:  ibuprofen 800mg , ASA 81mg  Current analgesics:  no Current triptans:  no Current anti-emetic:  no Current muscle relaxants:  no Current anti-anxiolytic:  no Current sleep aide:  no Current Antihypertensive medications:  lisinopril, HCTZ Current Antidepressant medications:  amitriptyline 10mg  Current Anticonvulsant medications:  no Current Vitamins/Herbal/Supplements:  no Current Antihistamines/Decongestants:  no Other therapy:  no  Past NSAIDS:  Aleve Past analgesics:  Tylenol Past abortive triptans:  no Past muscle relaxants:  no Past anti-emetic:  no Past antihypertensive medications:  Losartan, possibly propranolol Past antidepressant medications:  no Past anticonvulsant medications:  no Past vitamins/Herbal/Supplements:  no Past antihistamines/decongestants:  no Other past therapies:  no  Caffeine:  no Alcohol:   no Smoker:  no Diet:  Hydrates. Exercise:  Not routine Depression:  no; Anxiety:  Yes Other pain:  no Sleep hygiene:  good Family history of headache:  no  08/02/17 BMP:  Na 138, K 3.5, Cl 102, CO2 26, glucose 95, BUN 18, Cr 1.40, Ca 9.1  PAST MEDICAL HISTORY: Past Medical History:  Diagnosis Date  . Enlarged tonsils   . Herpes   . Hypertension   . Migraines   . Morbid obesity ([place])     PAST SURGICAL HISTORY: Past Surgical History:  Procedure Laterality Date  . TUBAL LIGATION      MEDICATIONS: Current Outpatient Medications on File Prior to Visit  Medication Sig Dispense Refill  . acyclovir (ZOVIRAX) 400 MG tablet Take 1 tablet (400 mg total) by mouth 2 (two) times daily. 180 tablet 3  . aspirin EC 81 MG tablet Take 81 mg by mouth daily.    . hydrochlorothiazide (HYDRODIURIL) 25 MG tablet Take 1 tablet (25 mg total) by mouth daily. 90 tablet 0  . ibuprofen (ADVIL,MOTRIN) 800 MG tablet Take 1 tablet (800 mg total) by mouth 3 (three) times daily. 21 tablet 0  . lisinopril (PRINIVIL,ZESTRIL) 40 MG tablet Take 1 tablet (40 mg total) by mouth daily. 30 tablet 0  . omeprazole (PRILOSEC) 20 MG capsule Take 1 capsule (20 mg total) by mouth daily. 90 capsule 3   No current facility-administered medications on file prior to visit.     ALLERGIES: Allergies  Allergen Reactions  . Potassium-Containing Compounds Other (See Comments)    Syncope and cramps     FAMILY HISTORY: Family History  Problem Relation Age of Onset  . Heart attack Mother 58  . Hypertension Father   . Heart attack Father 16  . Hypertension Sister   . Hypertension Brother   .  Arrhythmia Brother   . Hypertension Sister     SOCIAL HISTORY: Social History   Socioeconomic History  . Marital status: Married    Spouse name: Not on file  . Number of children: Not on file  . Years of education: Not on file  . Highest education level: Not on file  Occupational History  . Not on file  Social Needs  .  Financial resource strain: Not on file  . Food insecurity:    Worry: Not on file    Inability: Not on file  . Transportation needs:    Medical: Not on file    Non-medical: Not on file  Tobacco Use  . Smoking status: Never Smoker  . Smokeless tobacco: Never Used  Substance and Sexual Activity  . Alcohol use: No  . Drug use: No  . Sexual activity: Yes    Birth control/protection: Surgical  Lifestyle  . Physical activity:    Days per week: Not on file    Minutes per session: Not on file  . Stress: Not on file  Relationships  . Social connections:    Talks on phone: Not on file    Gets together: Not on file    Attends religious service: Not on file    Active member of club or organization: Not on file    Attends meetings of clubs or organizations: Not on file    Relationship status: Not on file  . Intimate partner violence:    Fear of current or ex partner: Not on file    Emotionally abused: Not on file    Physically abused: Not on file    Forced sexual activity: Not on file  Other Topics Concern  . 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, hospital   Married for 24 years    Five children all live locally    REVIEW OF SYSTEMS: Constitutional: No fevers, chills, or sweats, no generalized fatigue, change in appetite Eyes: No visual changes, double vision, eye pain Ear, nose and throat: No hearing loss, ear pain, nasal congestion, sore throat Cardiovascular: No chest pain, palpitations Respiratory:  No shortness of breath at rest or with exertion, wheezes GastrointestinaI: No nausea, vomiting, diarrhea, abdominal pain, fecal incontinence Genitourinary:  No dysuria, urinary retention or frequency Musculoskeletal:  No neck pain, back pain Integumentary: No rash, pruritus, skin lesions Neurological: as above Psychiatric: No depression, insomnia, anxiety Endocrine: No palpitations, fatigue, diaphoresis, mood swings, change in appetite, change in  weight, increased thirst Hematologic/Lymphatic:  No purpura, petechiae. Allergic/Immunologic: no itchy/runny eyes, nasal congestion, recent allergic reactions, rashes  PHYSICAL EXAM: Vitals:   10/23/17 1458  BP: 116/80  Pulse: 87  Resp: 16  SpO2: 98%   General: No acute distress.  Patient appears well-groomed.  Head:  Normocephalic/atraumatic Eyes:  fundi examined but not visualized Neck: supple, no paraspinal tenderness, full range of motion Back: No paraspinal tenderness Heart: regular rate and rhythm Lungs: Clear to auscultation bilaterally. Vascular: No carotid bruits. Neurological Exam: Mental status: alert and oriented to person, place, and time, recent and remote memory intact, fund of knowledge intact, attention and concentration intact, speech fluent and not dysarthric, language intact. Cranial nerves: CN I: not tested CN II: pupils equal, round and reactive to light, visual fields intact CN III, IV, VI:  full range of motion, no nystagmus, no ptosis CN V: facial sensation intact CN VII: upper and lower face symmetric CN VIII: hearing intact CN IX,  X: gag intact, uvula midline CN XI: sternocleidomastoid and trapezius muscles intact CN XII: tongue midline Bulk & Tone: normal, no fasciculations. Motor:  5/5 throughout  Sensation: temperature and vibration sensation intact. Deep Tendon Reflexes:  2+ throughout, toes downgoing.  Finger to nose testing:  Without dysmetria.  Heel to shin:  Without dysmetria.  Gait:  Normal station and stride.  Romberg negative.  IMPRESSION: Her headaches are more consistent with chronic tension type headache, not intractable, rather than migraine  PLAN: 1.  Increase amitriptyline to 20mg  to 25mg  at bedtime.  Advised to monitor for increased blood pressure, which is a possible side effect. 2.  Limit use of ibuprofen to no more than 2 days out of the week to prevent rebound headache 3.  Keep headache diary 4.  Increase exercise 5.   Continue water intake 6.  Follow up in 3 months.  Thank you for allowing me to take part in the care of this patient.  Metta Clines, DO  CC:  Janet Peng, NP

## 2017-10-23 NOTE — Patient Instructions (Signed)
1.  Increase amitriptyline 10mg  to 2 pills (20mg ) at bedtime.  When you are finished in 2 weeks, start 25mg  at bedtime (I will send in prescription).  In 6 weeks contact me and we can either continue 25mg  at bedtime or increase dose.  Monitor blood pressure, because it may increase blood pressure sometimes. 2.  Limit use of ibuprofen to no more than 2 days out of the week to prevent rebound headache 3.  Keep headache diary 4.  Increase exercise 5.  Continue water intake 6.  Follow up in 3 months.

## 2017-10-24 ENCOUNTER — Encounter: Payer: Self-pay | Admitting: Cardiology

## 2017-10-24 ENCOUNTER — Ambulatory Visit: Payer: BLUE CROSS/BLUE SHIELD | Admitting: Cardiology

## 2017-10-24 VITALS — BP 140/100 | HR 88 | Ht 62.0 in | Wt 234.0 lb

## 2017-10-24 DIAGNOSIS — I10 Essential (primary) hypertension: Secondary | ICD-10-CM

## 2017-10-24 DIAGNOSIS — R55 Syncope and collapse: Secondary | ICD-10-CM | POA: Insufficient documentation

## 2017-10-24 HISTORY — DX: Syncope and collapse: R55

## 2017-10-24 NOTE — Progress Notes (Addendum)
Cardiology Office Note:    Date:  10/24/2017   ID:  Janet Sanchez, DOB 1971/05/30, MRN [id number]  PCP:  Janet Peng, NP  Cardiologist:  No primary care provider on file.    Referring MD: Janet Peng, NP   Chief Complaint  Patient presents with  . Follow-up    Syncope, HTN    History of Present Illness:    Janet Sanchez is a 47 y.o. female with a hx of HTN, obesity and vasovagal syncope.    Past Medical History:  Diagnosis Date  . Enlarged tonsils   . Herpes   . Hypertension   . Migraines   . Morbid obesity ([place])   . Vasovagal syncope 10/24/2017    Past Surgical History:  Procedure Laterality Date  . TUBAL LIGATION      Current Medications: Current Meds  Medication Sig  . acyclovir (ZOVIRAX) 400 MG tablet Take 1 tablet (400 mg total) by mouth 2 (two) times daily.  Marland Kitchen amitriptyline (ELAVIL) 25 MG tablet Take 1 tablet (25 mg total) by mouth at bedtime.  Marland Kitchen aspirin EC 81 MG tablet Take 81 mg by mouth daily.  . hydrochlorothiazide (HYDRODIURIL) 25 MG tablet Take 1 tablet (25 mg total) by mouth daily.  Marland Kitchen ibuprofen (ADVIL,MOTRIN) 800 MG tablet Take 1 tablet (800 mg total) by mouth 3 (three) times daily.  Marland Kitchen lisinopril (PRINIVIL,ZESTRIL) 40 MG tablet Take 1 tablet (40 mg total) by mouth daily.     Allergies:   Potassium-containing compounds   Social History   Socioeconomic History  . Marital status: Married    Spouse name: Not on file  . Number of children: Not on file  . Years of education: Not on file  . Highest education level: Not on file  Occupational History  . Not on file  Social Needs  . Financial resource strain: Not on file  . Food insecurity:    Worry: Not on file    Inability: Not on file  . Transportation needs:    Medical: Not on file    Non-medical: Not on file  Tobacco Use  . Smoking status: Never Smoker  . Smokeless tobacco: Never Used  Substance and Sexual Activity  . Alcohol use: No  . Drug use: No  . Sexual activity:  Yes    Birth control/protection: Surgical  Lifestyle  . Physical activity:    Days per week: Not on file    Minutes per session: Not on file  . Stress: Not on file  Relationships  . Social connections:    Talks on phone: Not on file    Gets together: Not on file    Attends religious service: Not on file    Active member of club or organization: Not on file    Attends meetings of clubs or organizations: Not on file    Relationship status: Not on file  Other Topics Concern  . 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  ROS:   Please see the history of present illness.    ROS  All other systems reviewed and negative.   EKGs/Labs/Other Studies Reviewed:  The following studies were reviewed today: none  EKG:  EKG is not ordered today.   Recent Labs: 05/22/2017: ALT 21 08/02/2017: BUN 18; Creatinine, Ser 1.40; Hemoglobin 11.9; Platelets 298; Potassium 3.5; Sodium 138   Recent Lipid Panel    Component Value Date/Time   CHOL 190 07/11/2016 0803   TRIG 47.0 07/11/2016 0803   HDL 61.60 07/11/2016 0803   CHOLHDL 3 07/11/2016 0803   VLDL 9.4 07/11/2016 0803   LDLCALC 119 (H) 07/11/2016 0803    Physical Exam:    VS:  BP (!) 140/100   Pulse 88   Ht 5\' 2"  (1.575 m)   Wt 234 lb (106.1 kg)   LMP 10/13/2017   BMI 42.80 kg/m     Wt Readings from Last 3 Encounters:  10/24/17 234 lb (106.1 kg)  10/23/17 232 lb 4.8 oz (105.4 kg)  09/04/17 233 lb (105.7 kg)     GEN:  Well nourished, well developed in no acute distress HEENT: Normal NECK: No JVD; No carotid bruits LYMPHATICS: No lymphadenopathy CARDIAC: RRR, no murmurs, rubs,  gallops RESPIRATORY:  Clear to auscultation without rales, wheezing or rhonchi  ABDOMEN: Soft, non-tender, non-distended MUSCULOSKELETAL:  No edema; No deformity  SKIN: Warm and dry NEUROLOGIC:  Alert and oriented x 3 PSYCHIATRIC:  Normal affect   ASSESSMENT:    1. Vasovagal syncope   2. Essential hypertension   3. Morbid obesity ([place])    PLAN:    In order of problems listed above:  1.  Vasovagal syncope -she has not had any further syncopal episodes since I saw her last.  2.  HTN - BP is poorly controlled on exam today.  She will continue on HCTZ 25mg  daily and Lisinopril 40mg  daily.  She says that her BP at home runs at 140/60mmHg.  I have asked her to set up an appt with her PCP to discuss ongoing hypertensive therapy.  3.  Morbid obesity -I encouraged her to get into routine exercise program and try to lose some weight.  She is to follow back with me on a as needed basis.  She will let me know if she has any further dizziness or syncope.   Medication Adjustments/Labs and Tests Ordered: Current medicines are reviewed at length with the patient today.  Concerns regarding medicines are outlined above.  No orders of the defined types were placed in this encounter.  No orders of the defined types were placed in this encounter.   Signed, Fransico Him, MD  10/24/2017 4:32 PM    [facility]

## 2017-10-24 NOTE — Patient Instructions (Signed)
Medication Instructions:  Your physician recommends that you continue on your current medications as directed. Please refer to the Current Medication list given to you today.  If you need a refill on your cardiac medications, please contact your pharmacy first.  Labwork: None ordered   Testing/Procedures: None ordered   Follow-Up: Your physician wants you to follow-up as needed with Dr. Turner.   Any Other Special Instructions Will Be Listed Below (If Applicable).   Thank you for choosing [facility]    Rena Shamina Etheridge, RN  [phone]  If you need a refill on your cardiac medications before your next appointment, please call your pharmacy.   

## 2017-11-16 ENCOUNTER — Encounter: Payer: Self-pay | Admitting: Neurology

## 2017-11-20 ENCOUNTER — Other Ambulatory Visit: Payer: Self-pay | Admitting: Family Medicine

## 2017-11-20 DIAGNOSIS — I10 Essential (primary) hypertension: Secondary | ICD-10-CM

## 2017-11-20 MED ORDER — HYDROCHLOROTHIAZIDE 25 MG PO TABS: 25.0000 mg | ORAL_TABLET | Freq: Every day | ORAL | 0 refills | Status: DC

## 2017-11-20 MED ORDER — LISINOPRIL 40 MG PO TABS: 40.0000 mg | ORAL_TABLET | Freq: Every day | ORAL | 0 refills | Status: DC

## 2017-11-20 NOTE — Telephone Encounter (Signed)
She can have 30 days. Needs to schedule CPE for further medication

## 2017-11-20 NOTE — Telephone Encounter (Signed)
Pt has not had a cpx since 06/2016.  Please advise.

## 2017-11-20 NOTE — Telephone Encounter (Signed)
#  30 sent to the pharmacy by e-scribe. 

## 2017-12-03 ENCOUNTER — Ambulatory Visit (INDEPENDENT_AMBULATORY_CARE_PROVIDER_SITE_OTHER): Payer: BLUE CROSS/BLUE SHIELD | Admitting: Family Medicine

## 2017-12-03 DIAGNOSIS — Z23 Encounter for immunization: Secondary | ICD-10-CM | POA: Diagnosis not present

## 2017-12-12 ENCOUNTER — Encounter: Payer: Self-pay | Admitting: Adult Health

## 2017-12-12 ENCOUNTER — Ambulatory Visit (INDEPENDENT_AMBULATORY_CARE_PROVIDER_SITE_OTHER): Payer: BLUE CROSS/BLUE SHIELD | Admitting: Adult Health

## 2017-12-12 VITALS — BP 146/96 | Temp 98.0°F | Ht 63.5 in | Wt 237.0 lb

## 2017-12-12 DIAGNOSIS — Z114 Encounter for screening for human immunodeficiency virus [HIV]: Secondary | ICD-10-CM | POA: Diagnosis not present

## 2017-12-12 DIAGNOSIS — I10 Essential (primary) hypertension: Secondary | ICD-10-CM

## 2017-12-12 DIAGNOSIS — M25562 Pain in left knee: Secondary | ICD-10-CM | POA: Diagnosis not present

## 2017-12-12 DIAGNOSIS — Z0000 Encounter for general adult medical examination without abnormal findings: Secondary | ICD-10-CM

## 2017-12-12 LAB — BASIC METABOLIC PANEL
BUN: 16 mg/dL (ref 6–23)
CALCIUM: 9.8 mg/dL (ref 8.4–10.5)
CO2: 31 meq/L (ref 19–32)
Chloride: 100 mEq/L (ref 96–112)
Creatinine, Ser: 1.15 mg/dL (ref 0.40–1.20)
GFR: 65.1 mL/min (ref >60.00)
GLUCOSE: 80 mg/dL (ref 70–99)
POTASSIUM: 4.1 meq/L (ref 3.5–5.1)
SODIUM: 138 meq/L (ref 135–145)

## 2017-12-12 LAB — CBC WITH DIFFERENTIAL/PLATELET
Basophils Absolute: 0 10*3/uL (ref 0.0–0.1)
Basophils Relative: 0.4 % (ref 0.0–3.0)
Eosinophils Absolute: 0.1 10*3/uL (ref 0.0–0.7)
Eosinophils Relative: 1.4 % (ref 0.0–5.0)
HCT: 35.3 % — ABNORMAL LOW (ref 36.0–46.0)
Hemoglobin: 12 g/dL (ref 12.0–15.0)
LYMPHS ABS: 1.4 10*3/uL (ref 0.7–4.0)
Lymphocytes Relative: 34.4 % (ref 12.0–46.0)
MCHC: 33.9 g/dL (ref 30.0–36.0)
MCV: 89.1 fl (ref 78.0–100.0)
MONOS PCT: 5.8 % (ref 3.0–12.0)
Monocytes Absolute: 0.2 10*3/uL (ref 0.1–1.0)
NEUTROS PCT: 58 % (ref 43.0–77.0)
Neutro Abs: 2.4 10*3/uL (ref 1.4–7.7)
Platelets: 258 10*3/uL (ref 150.0–400.0)
RBC: 3.96 Mil/uL (ref 3.87–5.11)
RDW: 13.3 % (ref 11.5–15.5)
WBC: 4.2 10*3/uL (ref 4.0–10.5)

## 2017-12-12 LAB — LIPID PANEL
CHOLESTEROL: 202 mg/dL — AB (ref 0–200)
HDL: 58.9 mg/dL (ref >39.00)
LDL Cholesterol: 116 mg/dL — ABNORMAL HIGH (ref 0–99)
NonHDL: 143.3
TRIGLYCERIDES: 138 mg/dL (ref 0.0–149.0)
Total CHOL/HDL Ratio: 3
VLDL: 27.6 mg/dL (ref 0.0–40.0)

## 2017-12-12 LAB — TSH: TSH: 1.36 u[IU]/mL (ref 0.35–4.50)

## 2017-12-12 LAB — HEPATIC FUNCTION PANEL
ALBUMIN: 4 g/dL (ref 3.5–5.2)
ALT: 15 U/L (ref 0–35)
AST: 17 U/L (ref 0–37)
Alkaline Phosphatase: 69 U/L (ref 39–117)
Bilirubin, Direct: 0.1 mg/dL (ref 0.0–0.3)
Total Bilirubin: 0.7 mg/dL (ref 0.2–1.2)
Total Protein: 7.8 g/dL (ref 6.0–8.3)

## 2017-12-12 MED ORDER — HYDROCHLOROTHIAZIDE 25 MG PO TABS: 25.0000 mg | ORAL_TABLET | Freq: Every day | ORAL | 3 refills | Status: DC

## 2017-12-12 MED ORDER — LISINOPRIL 40 MG PO TABS: 40.0000 mg | ORAL_TABLET | Freq: Every day | ORAL | 3 refills | Status: DC

## 2017-12-12 NOTE — Progress Notes (Signed)
Subjective:    Patient ID: Janet Sanchez, female    DOB: 03-08-1971, 47 y.o.   MRN: [id number]  HPI  Patient presents for yearly preventative medicine examination. She is a pleasant 47 year old female who  has a past medical history of Enlarged tonsils, Herpes, Hypertension, Migraines, Morbid obesity ([place]), and Vasovagal syncope (10/24/2017).  Essential Hypertension - Controlled with Lisinopril and HCTZ. She reports readings at home in the 115-120/60-70's.  BP Readings from Last 3 Encounters:  12/12/17 (!) 146/96  10/24/17 (!) 140/100  10/23/17 116/80   Migraines - is being seen by Dr. Tomi Likens. Currently prescribed Elavil 25 mg. She reports that she stopped taking this medication as she felt as though it was causing worsening carpal tunnel syndrome.    All immunizations and health maintenance protocols were reviewed with the patient and needed orders were placed. She is UTD   Appropriate screening laboratory values were ordered for the patient including screening of hyperlipidemia, renal function and hepatic function.  Medication reconciliation,  past medical history, social history, problem list and allergies were reviewed in detail with the patient  Goals were established with regard to weight loss, exercise, and  diet in compliance with medications.  She is not exercising on a regular basis nor is she eating a heart healthy diet.  She has started drinking more water  Wt Readings from Last 3 Encounters:  12/12/17 237 lb (107.5 kg)  10/24/17 234 lb (106.1 kg)  10/23/17 232 lb 4.8 oz (105.4 kg)   She is going to make an appointment with her GYN for her pap and mammogram. She does self breast exams and has not noticed any changes. Is up to date on routine dental and vision screens.    Review of Systems  Constitutional: Negative.   HENT: Negative.   Eyes: Negative.   Respiratory: Negative.   Cardiovascular: Negative.   Gastrointestinal: Negative.   Endocrine: Negative.     Genitourinary: Negative.   Musculoskeletal: Positive for arthralgias.  Skin: Negative.   Allergic/Immunologic: Negative.   Neurological: Negative.   Hematological: Negative.   Psychiatric/Behavioral: Negative.    Past Medical History:  Diagnosis Date  . Enlarged tonsils   . Herpes   . Hypertension   . Migraines   . Morbid obesity ([place])   . Vasovagal syncope 10/24/2017    Social History   Socioeconomic History  . Marital status: Married    Spouse name: Not on file  . Number of children: Not on file  . Years of education: Not on file  . Highest education level: Not on file  Occupational History  . Not on file  Social Needs  . Financial resource strain: Not on file  . Food insecurity:    Worry: Not on file    Inability: Not on file  . Transportation needs:    Medical: Not on file    Non-medical: Not on file  Tobacco Use  . Smoking status: Never Smoker  . Smokeless tobacco: Never Used  Substance and Sexual Activity  . Alcohol use: No  . Drug use: No  . Sexual activity: Yes    Birth control/protection: Surgical  Lifestyle  . Physical activity:    Days per week: Not on file    Minutes per session: Not on file  . Stress: Not on file  Relationships  . Social connections:    Talks on phone: Not on file    Gets together: Not on file    Attends religious service:  Not on file    Active member of club or organization: Not on file    Attends meetings of clubs or organizations: Not on file    Relationship status: Not on file  . Intimate partner violence:    Fear of current or ex partner: Not on file    Emotionally abused: Not on file    Physically abused: Not on file    Forced sexual activity: Not on file  Other Topics Concern  . 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 TUBAL LIGATION      Family History   Problem Relation Age of Onset  . Heart attack Mother 5  . Hypertension Father   . Heart attack Father 48  . Hypertension Sister   . Hypertension Brother   . Arrhythmia Brother   . Hypertension Sister     Allergies  Allergen Reactions  . Potassium-Containing Compounds Other (See Comments)    Syncope and cramps     Current Outpatient Medications on File Prior to Visit  Medication Sig Dispense Refill  . acyclovir (ZOVIRAX) 400 MG tablet Take 1 tablet (400 mg total) by mouth 2 (two) times daily. 180 tablet 3  . aspirin EC 81 MG tablet Take 81 mg by mouth daily.    . hydrochlorothiazide (HYDRODIURIL) 25 MG tablet Take 1 tablet (25 mg total) by mouth daily. 30 tablet 0  . ibuprofen (ADVIL,MOTRIN) 800 MG tablet Take 1 tablet (800 mg total) by mouth 3 (three) times daily. 21 tablet 0  . lisinopril (PRINIVIL,ZESTRIL) 40 MG tablet Take 1 tablet (40 mg total) by mouth daily. 30 tablet 0   No current facility-administered medications on file prior to visit.     BP (!) 146/96   Temp 98 F (36.7 C) (Oral)   Ht 5' 3.5" (1.613 m)   Wt 237 lb (107.5 kg)   BMI 41.32 kg/m       Objective:   Physical Exam  Constitutional: She is oriented to person, place, and time. She appears well-developed and well-nourished. No distress.  Obese  HENT:  Head: Normocephalic and atraumatic.  Right Ear: External ear normal.  Left Ear: External ear normal.  Nose: Nose normal.  Mouth/Throat: Oropharynx is clear and moist. No oropharyngeal exudate.  Eyes: Pupils are equal, round, and reactive to light. Conjunctivae and EOM are normal. Right eye exhibits no discharge. Left eye exhibits no discharge. No scleral icterus.  Neck: Normal range of motion. Neck supple. No JVD present. No tracheal deviation present. No thyromegaly present.  Cardiovascular: Normal rate, regular rhythm, normal heart sounds and intact distal pulses. Exam reveals no gallop and no friction rub.  No murmur heard. Pulmonary/Chest:  Effort normal and breath sounds normal. No stridor. No respiratory distress. She has no wheezes. She has no rales. She exhibits no tenderness.  Abdominal: Soft. Bowel sounds are normal. She exhibits no distension and no mass. There is no tenderness. There is no rebound and no guarding. No hernia.  Genitourinary:  Genitourinary Comments: Will have done a GYN  Musculoskeletal: Normal range of motion. She exhibits tenderness. She exhibits no edema or deformity.       Left knee: She exhibits bony tenderness. She exhibits normal range of motion, no swelling, normal alignment, no LCL laxity, normal patellar mobility and no MCL  laxity. Tenderness found. Medial joint line and MCL tenderness noted. No lateral joint line, no LCL and no patellar tendon tenderness noted.  Lymphadenopathy:    She has no cervical adenopathy.  Neurological: She is alert and oriented to person, place, and time. She displays normal reflexes. No cranial nerve deficit or sensory deficit. She exhibits normal muscle tone. Coordination normal.  Skin: Skin is warm and dry. Capillary refill takes less than 2 seconds. No rash noted. She is not diaphoretic. No erythema. No pallor.  Psychiatric: She has a normal mood and affect. Her behavior is normal. Judgment and thought content normal.  Nursing note and vitals reviewed.     Assessment & Plan:  1. Routine general medical examination at a health care facility - Needs to work on weight loss  - Follow up in one year or sooner if needed - Basic metabolic panel - CBC with Differential/Platelet - Hepatic function panel - Lipid panel - TSH - Iron, TIBC and Ferritin Panel  2. Essential hypertension -Unknown if her blood pressure is truly controlled at home.  We will have her bring in her blood pressure cuff at the next. Continue on current medication therapy for now - Basic metabolic panel - CBC with Differential/Platelet - Hepatic function panel - Lipid panel - TSH -  hydrochlorothiazide (HYDRODIURIL) 25 MG tablet; Take 1 tablet (25 mg total) by mouth daily.  Dispense: 90 tablet; Refill: 3 - lisinopril (PRINIVIL,ZESTRIL) 40 MG tablet; Take 1 tablet (40 mg total) by mouth daily.  Dispense: 90 tablet; Refill: 3  3. Encounter for screening for HIV  - HIV antibody  4. Morbid obesity ([place]) -She needs to work on weight loss through heart healthy diet and exercise.  5. Acute pain of left knee -Peers as ligament sprain.  Advised anti-inflammatories as needed, ice, and rest with elevation in the evening.  Follow-up if no improvement   [name], NP

## 2017-12-13 LAB — IRON,TIBC AND FERRITIN PANEL
%SAT: 24 % (calc) (ref 11–50)
FERRITIN: 37 ng/mL (ref 10–232)
IRON: 94 ug/dL (ref 40–190)
TIBC: 387 mcg/dL (calc) (ref 250–450)

## 2017-12-13 LAB — HIV ANTIBODY (ROUTINE TESTING W REFLEX): HIV: NONREACTIVE

## 2018-01-01 ENCOUNTER — Emergency Department (HOSPITAL_COMMUNITY): Payer: BLUE CROSS/BLUE SHIELD

## 2018-01-01 ENCOUNTER — Other Ambulatory Visit: Payer: Self-pay

## 2018-01-01 ENCOUNTER — Encounter (HOSPITAL_COMMUNITY): Payer: Self-pay

## 2018-01-01 ENCOUNTER — Emergency Department (HOSPITAL_COMMUNITY)
Admission: EM | Admit: 2018-01-01 | Discharge: 2018-01-01 | Disposition: A | Discharge status: Home / Self Care | Payer: BLUE CROSS/BLUE SHIELD | Attending: Emergency Medicine | Admitting: Emergency Medicine

## 2018-01-01 DIAGNOSIS — Z7982 Long term (current) use of aspirin: Secondary | ICD-10-CM | POA: Insufficient documentation

## 2018-01-01 DIAGNOSIS — I10 Essential (primary) hypertension: Secondary | ICD-10-CM | POA: Insufficient documentation

## 2018-01-01 DIAGNOSIS — J069 Acute upper respiratory infection, unspecified: Secondary | ICD-10-CM | POA: Diagnosis not present

## 2018-01-01 DIAGNOSIS — M5431 Sciatica, right side: Secondary | ICD-10-CM | POA: Diagnosis not present

## 2018-01-01 DIAGNOSIS — R200 Anesthesia of skin: Secondary | ICD-10-CM | POA: Diagnosis present

## 2018-01-01 DIAGNOSIS — Z79899 Other long term (current) drug therapy: Secondary | ICD-10-CM | POA: Insufficient documentation

## 2018-01-01 LAB — BASIC METABOLIC PANEL
Anion gap: 8 (ref 5–15)
BUN: 17 mg/dL (ref 6–20)
CO2: 30 mmol/L (ref 22–32)
Calcium: 9.1 mg/dL (ref 8.9–10.3)
Chloride: 101 mmol/L (ref 101–111)
Creatinine, Ser: 1.36 mg/dL — ABNORMAL HIGH (ref 0.44–1.00)
GFR calc Af Amer: 53 mL/min — ABNORMAL LOW (ref >60)
GFR calc non Af Amer: 46 mL/min — ABNORMAL LOW (ref >60)
Glucose, Bld: 90 mg/dL (ref 65–99)
Potassium: 4.2 mmol/L (ref 3.5–5.1)
Sodium: 139 mmol/L (ref 135–145)

## 2018-01-01 LAB — CBC WITH DIFFERENTIAL/PLATELET
Basophils Absolute: 0 10*3/uL (ref 0.0–0.1)
Basophils Relative: 0 %
Eosinophils Absolute: 0 10*3/uL (ref 0.0–0.7)
Eosinophils Relative: 1 %
HCT: 35.1 % — ABNORMAL LOW (ref 36.0–46.0)
Hemoglobin: 11.6 g/dL — ABNORMAL LOW (ref 12.0–15.0)
Lymphocytes Relative: 19 %
Lymphs Abs: 1.3 10*3/uL (ref 0.7–4.0)
MCH: 30.1 pg (ref 26.0–34.0)
MCHC: 33 g/dL (ref 30.0–36.0)
MCV: 90.9 fL (ref 78.0–100.0)
Monocytes Absolute: 0.2 10*3/uL (ref 0.1–1.0)
Monocytes Relative: 3 %
Neutro Abs: 5.2 10*3/uL (ref 1.7–7.7)
Neutrophils Relative %: 77 %
Platelets: 214 10*3/uL (ref 150–400)
RBC: 3.86 MIL/uL — ABNORMAL LOW (ref 3.87–5.11)
RDW: 12.9 % (ref 11.5–15.5)
WBC: 6.7 10*3/uL (ref 4.0–10.5)

## 2018-01-01 MED ORDER — PREDNISONE 50 MG PO TABS: 50.0000 mg | ORAL_TABLET | Freq: Every day | ORAL | 0 refills | Status: DC

## 2018-01-01 MED ORDER — GUAIFENESIN ER 1200 MG PO TB12: 1.0000 | ORAL_TABLET | Freq: Two times a day (BID) | ORAL | 0 refills | Status: DC

## 2018-01-01 MED ORDER — TRAMADOL HCL 50 MG PO TABS: 50.0000 mg | ORAL_TABLET | Freq: Four times a day (QID) | ORAL | 0 refills | Status: DC | PRN

## 2018-01-01 MED ORDER — CYCLOBENZAPRINE HCL 10 MG PO TABS: 10.0000 mg | ORAL_TABLET | Freq: Every day | ORAL | 0 refills | Status: DC

## 2018-01-01 NOTE — ED Notes (Signed)
Patient transported to X-ray 

## 2018-01-01 NOTE — ED Triage Notes (Addendum)
Pt states right sided numbness x 1 week, but worse today. Pt states her right sided felt like it was being stuck with pins from hip to ank. Pt ambulatory and FROM. Pt states unable to be on her feet for a full hour.  Pt states increase in lisinopril and HCTZ rx dosage. Pt states worse numbness the longer she stands

## 2018-01-01 NOTE — ED Provider Notes (Signed)
[facility] DEPT Provider Note   CSN: [id number] Arrival date & time: 01/01/18  1205     History   Chief Complaint Chief Complaint  Patient presents with  . Numbness    HPI Janet Sanchez is a 47 y.o. female.  HPI Patient presents to the emergency department with lower back discomfort and with tingling in her right leg.  The patient states that she is also had a cough is been productive of thick sputum.  The patient states that these both have been ongoing over the last week.  The patient states that the only involving her right leg.  Patient states nothing seems to make the condition better but standing for long periods makes the pain and tingling in her leg worse.  The patient denies chest pain, shortness of breath, headache,blurred vision, neck pain, fever,  weakness, numbness, dizziness, anorexia, edema, abdominal pain, nausea, vomiting, diarrhea, rash,dysuria, hematemesis, bloody stool, near syncope, or syncope. Past Medical History:  Diagnosis Date  . Enlarged tonsils   . Herpes   . Hypertension   . Migraines   . Morbid obesity ([place])   . Vasovagal syncope 10/24/2017    Patient Active Problem List   Diagnosis Date Noted  . Vasovagal syncope 10/24/2017  . GERD (gastroesophageal reflux disease) 07/10/2017  . Morbid obesity ([place]) 11/19/2013  . DUB (dysfunctional uterine bleeding) 05/08/2013  . Fibroid uterus 03/01/2011  . HERPETIC VULVOVAGINITIS 04/30/2007  . HSV 04/30/2007  . CARPAL TUNNEL SYNDROME, RIGHT 04/30/2007  . Essential hypertension 04/30/2007  . HYPERTROPHY, TONSILS 04/30/2007  . LOW BACK PAIN SYNDROME 04/30/2007    Past Surgical History:  Procedure Laterality Date  . TUBAL LIGATION       OB History    Gravida  10   Para  5   Term  4   Preterm  1   AB  5   Living  5     SAB  5   TAB      Ectopic      Multiple      Live Births               Home Medications    Prior to Admission  medications   Medication Sig Start Date End Date Taking? Authorizing Provider  acetaminophen (TYLENOL) 500 MG tablet Take 1,000 mg by mouth every 6 (six) hours as needed for mild pain or headache.   Yes [provider]  acyclovir (ZOVIRAX) 400 MG tablet Take 1 tablet (400 mg total) by mouth 2 (two) times daily. Patient taking differently: Take 400 mg by mouth See admin instructions. During breakouts takes 400mg  once a day. 03/20/17  Yes Nafziger, Tommi Rumps, NP  aspirin EC 81 MG tablet Take 81 mg by mouth daily.   Yes [provider]  hydrochlorothiazide (HYDRODIURIL) 25 MG tablet Take 1 tablet (25 mg total) by mouth daily. 12/12/17  Yes Nafziger, Tommi Rumps, NP  ibuprofen (ADVIL,MOTRIN) 200 MG tablet Take 800 mg by mouth 2 (two) times daily as needed for headache. Per neurologist: limit to 2 days a week to avoid rebound headaches.   Yes [provider]  lisinopril (PRINIVIL,ZESTRIL) 40 MG tablet Take 1 tablet (40 mg total) by mouth daily. 12/12/17  Yes Nafziger, Tommi Rumps, NP  omeprazole (PRILOSEC) 20 MG capsule Take 20 mg by mouth daily. 12/24/17  Yes [provider]  ibuprofen (ADVIL,MOTRIN) 800 MG tablet Take 1 tablet (800 mg total) by mouth 3 (three) times daily. Patient not taking: Reported on  01/01/2018 08/30/17   Palumbo, April, MD    Family History Family History  Problem Relation Age of Onset  . Heart attack Mother 47  . Hypertension Father   . Heart attack Father 24  . Hypertension Sister   . Hypertension Brother   . Arrhythmia Brother   . Hypertension Sister     Social History Social History   Tobacco Use  . Smoking status: Never Smoker  . Smokeless tobacco: Never Used  Substance Use Topics  . Alcohol use: No  . Drug use: No     Allergies   Potassium-containing compounds   Review of Systems Review of Systems All other systems negative except as documented in the HPI. All pertinent positives and negatives as reviewed in the HPI.  Physical  Exam Updated Vital Signs BP 127/83   Pulse 71   Temp 98 F (36.7 C) (Oral)   Resp 18   Ht 5\' 3"  (1.6 m)   Wt 106.6 kg (235 lb)   SpO2 100%   BMI 41.63 kg/m   Physical Exam  Constitutional: She is oriented to person, place, and time. She appears well-developed and well-nourished. No distress.  HENT:  Head: Normocephalic and atraumatic.  Mouth/Throat: Oropharynx is clear and moist.  Eyes: Pupils are equal, round, and reactive to light.  Neck: Normal range of motion. Neck supple.  Cardiovascular: Normal rate, regular rhythm and normal heart sounds. Exam reveals no gallop and no friction rub.  No murmur heard. Pulmonary/Chest: Effort normal and breath sounds normal. No respiratory distress. She has no wheezes.  Abdominal: Soft. Bowel sounds are normal. She exhibits no distension and no mass. There is no tenderness. There is no guarding.  Neurological: She is alert and oriented to person, place, and time. No sensory deficit. She exhibits normal muscle tone. Coordination normal.  Skin: Skin is warm and dry. Capillary refill takes less than 2 seconds. No rash noted. No erythema.  Psychiatric: She has a normal mood and affect. Her behavior is normal.  Nursing note and vitals reviewed.    ED Treatments / Results  Labs (all labs ordered are listed, but only abnormal results are displayed) Labs Reviewed  BASIC METABOLIC PANEL - Abnormal; Notable for the following components:      Result Value   Creatinine, Ser 1.36 (*)    GFR calc non Af Amer 46 (*)    GFR calc Af Amer 53 (*)    All other components within normal limits  CBC WITH DIFFERENTIAL/PLATELET - Abnormal; Notable for the following components:   RBC 3.86 (*)    Hemoglobin 11.6 (*)    HCT 35.1 (*)    All other components within normal limits    EKG None  Radiology Dg Chest 2 View  Result Date: 01/01/2018 CLINICAL DATA:  RIGHT side numbness for 1 week worse today, pins feeling from hip ankle EXAM: CHEST - 2 VIEW  COMPARISON:  08/30/2017 FINDINGS: Normal heart size, mediastinal contours, and pulmonary vascularity. Lungs clear. No pleural effusion or pneumothorax. Bones unremarkable. IMPRESSION: Normal exam. Electronically Signed   By: Lavonia Dana M.D.   On: 01/01/2018 16:30   Dg Lumbar Spine Complete  Result Date: 01/01/2018 CLINICAL DATA:  RIGHT side numbness for 1 week, worse today, pins feeling from RIGHT hip to ankle EXAM: LUMBAR SPINE - COMPLETE 4+ VIEW COMPARISON:  None FINDINGS: Five non-rib-bearing lumbar vertebra. Low normal osseous mineralization. Vertebral body heights maintained. Disc space narrowing with endplate spur formation throughout lumbar spine. No fracture, subluxation,  or bone destruction. No spondylolysis. SI joints preserved. 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 No acute abnormalities. Electronically Signed   By: Lavonia Dana M.D.   On: 01/01/2018 16:31    Procedures Procedures (including critical care time)  Medications Ordered in ED Medications - No data to display   Initial Impression / Assessment and Plan / ED Course  I have reviewed the triage vital signs and the nursing notes.  Pertinent labs & imaging results that were available during my care of the patient were reviewed by me and considered in my medical decision making (see chart for details).     Patient be treated for sciatica based on her HPI and physical exam findings.  She does have lower back discomfort on the right.  She does do a lot of standing and walking with bending and lifting at work.  Patient be treated for upper respiratory infection as well.  Told to follow-up with her primary doctor about her HCTZ and renal function.  Patient is advised to return here for any worsening in her condition patient agrees with plan and all questions were answered Final Clinical Impressions(s) / ED Diagnoses   Final diagnoses:  None    ED Discharge Orders    None       Dalia Heading, Hershal Coria 01/01/18 1733    Julianne Rice, MD 01/03/18 [phone]

## 2018-01-01 NOTE — Discharge Instructions (Addendum)
Return here as needed.  Follow-up with your primary doctor for a recheck.   Your lower back x-rays did show some degenerative arthritic changes in your lower back.  I would like for you to follow-up with your primary doctor on your renal function which is slightly increased and could be medication related.  These will contribute to this type of condition where you are having irritation of the sciatic nerve.

## 2018-01-02 ENCOUNTER — Telehealth: Payer: Self-pay | Admitting: Adult Health

## 2018-01-02 NOTE — Telephone Encounter (Signed)
GFR looks to be about the same.  Please advise.

## 2018-01-02 NOTE — Telephone Encounter (Signed)
Left a message for a return call.  CRM created. 

## 2018-01-02 NOTE — Telephone Encounter (Signed)
Stop HCTZ for decreased renal function.  Monitor blood pressure at home I would like her to follow-up next week and bring blood pressure readings with her

## 2018-01-02 NOTE — Telephone Encounter (Addendum)
Copied from [place] [id number]. Topic: Quick Communication - See Telephone Encounter >> Jan 02, 2018  9:03 AM Ivar Drape wrote: CRM for notification. See Telephone encounter for: 01/02/18. Patient went to the ER yesterday b/c her right side went numb. The doctor told her to follow up with her provider b/c her kidney levels were still up due to the BP meds hydrochlorothiazide (HYDRODIURIL) 25 MG tablet she is taking.  Patient would like to know if she should stop taking that medication. Please advise.

## 2018-01-03 ENCOUNTER — Encounter: Payer: Self-pay | Admitting: *Deleted

## 2018-01-03 NOTE — Telephone Encounter (Signed)
Left a message for a return call.  A CRM has been created

## 2018-01-03 NOTE — Telephone Encounter (Signed)
This encounter was created in error - please disregard.

## 2018-01-03 NOTE — Telephone Encounter (Signed)
Message read to patient; verbalizes understanding. 

## 2018-01-08 ENCOUNTER — Ambulatory Visit: Payer: BLUE CROSS/BLUE SHIELD | Admitting: Adult Health

## 2018-01-08 ENCOUNTER — Encounter: Payer: Self-pay | Admitting: Adult Health

## 2018-01-08 VITALS — BP 130/90 | Temp 98.6°F | Wt 234.0 lb

## 2018-01-08 DIAGNOSIS — N289 Disorder of kidney and ureter, unspecified: Secondary | ICD-10-CM

## 2018-01-08 DIAGNOSIS — R200 Anesthesia of skin: Secondary | ICD-10-CM | POA: Diagnosis not present

## 2018-01-08 DIAGNOSIS — R05 Cough: Secondary | ICD-10-CM | POA: Diagnosis not present

## 2018-01-08 DIAGNOSIS — R059 Cough, unspecified: Secondary | ICD-10-CM

## 2018-01-08 MED ORDER — HYDROCODONE-HOMATROPINE 5-1.5 MG/5ML PO SYRP: 5.0000 mL | ORAL_SOLUTION | Freq: Three times a day (TID) | ORAL | 0 refills | Status: DC | PRN

## 2018-01-08 MED ORDER — PREDNISONE 10 MG PO TABS: ORAL_TABLET | ORAL | 0 refills | Status: DC

## 2018-01-08 NOTE — Progress Notes (Signed)
Subjective:    Patient ID: Janet Sanchez, female    DOB: [birth year], 47 y.o.   MRN: [id number]  HPI  47 year old female who  has a past medical history of Enlarged tonsils, Herpes, Hypertension, Migraines, Morbid obesity ([place]), and Vasovagal syncope (10/24/2017). She presents to the office today to being seen in the emergency room 7 days ago.  She went to the emergency room with a complaint of lower back discomfort with tingling in her right leg.  She also reported a cough that has been productive with sputum and had been present for approximately 1 week.  She was treated for sciatica based on her physical exam findings.  She was prescribed Flexeril and a prednisone course, that she finished this afternoon.  He was also treated for an upper respiratory infection.  Additionally lab work showed that her kidney function was slightly decreased and she was to follow-up with me about this due to taking HCTZ.  Today in the office she reports that she continues to have cough although this is not productive any longer.  She denies any sinus pain or pressure, fevers or feeling acutely ill.  As for her sciatica she reports that this has resolved but she continues to have slight numbness in her right foot.  X-ray in the emergency room showed degenerative disc disease of the lumbar spine.  Has not taken any hydrochlorothiazide over the last week and has been monitoring her blood pressures at home.  Blood pressure today in the office is slightly elevated at 130/90, but she has been taking over-the-counter Robitussin and DayQuil for her cough.  Her blood pressures at home have been consistently in the 120's/70-80's.    Review of Systems  See HPI   Past Medical History:  Diagnosis Date  . Enlarged tonsils   . Herpes   . Hypertension   . Migraines   . Morbid obesity ([place])   . Vasovagal syncope 10/24/2017    Social History   Socioeconomic History  . Marital status: Married    Spouse name: Not on file    . Number of children: Not on file  . Years of education: Not on file  . Highest education level: Not on file  Occupational History  . Not on file  Social Needs  . Financial resource strain: Not on file  . Food insecurity:    Worry: Not on file    Inability: Not on file  . Transportation needs:    Medical: Not on file    Non-medical: Not on file  Tobacco Use  . Smoking status: Never Smoker  . Smokeless tobacco: Never Used  Substance and Sexual Activity  . Alcohol use: No  . Drug use: No  . Sexual activity: Yes    Birth control/protection: Surgical  Lifestyle  . Physical activity:    Days per week: Not on file    Minutes per session: Not on file  . Stress: Not on file  Relationships  . Social connections:    Talks on phone: Not on file    Gets together: Not on file    Attends religious service: Not on file    Active member of club or organization: Not on file    Attends meetings of clubs or organizations: Not on file    Relationship status: Not on file  . Intimate partner violence:    Fear of current or ex partner: Not on file    Emotionally abused: Not on file  Physically abused: Not on file    Forced sexual activity: Not on file  Other Topics Concern  . 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 TUBAL LIGATION      Family History  Problem Relation Age of Onset  . Heart attack Mother 34  . Hypertension Father   . Heart attack Father 62  . Hypertension Sister   . Hypertension Brother   . Arrhythmia Brother   . Hypertension Sister     Allergies  Allergen Reactions  . Potassium-Containing Compounds Other (See Comments)    Severe cramps caused her to pass out.    Current Outpatient Medications on File Prior to Visit  Medication Sig Dispense Refill  . acetaminophen (TYLENOL) 500 MG tablet Take 1,000 mg  by mouth every 6 (six) hours as needed for mild pain or headache.    Marland Kitchen acyclovir (ZOVIRAX) 400 MG tablet Take 1 tablet (400 mg total) by mouth 2 (two) times daily. (Patient taking differently: Take 400 mg by mouth See admin instructions. During breakouts takes 400mg  once a day.) 180 tablet 3  . aspirin EC 81 MG tablet Take 81 mg by mouth daily.    . Guaifenesin 1200 MG TB12 Take 1 tablet (1,200 mg total) by mouth 2 (two) times daily. 20 each 0  . ibuprofen (ADVIL,MOTRIN) 200 MG tablet Take 800 mg by mouth 2 (two) times daily as needed for headache. Per neurologist: limit to 2 days a week to avoid rebound headaches.    Marland Kitchen lisinopril (PRINIVIL,ZESTRIL) 40 MG tablet Take 1 tablet (40 mg total) by mouth daily. 90 tablet 3  . omeprazole (PRILOSEC) 20 MG capsule Take 20 mg by mouth daily.  3  . traMADol (ULTRAM) 50 MG tablet Take 1 tablet (50 mg total) by mouth every 6 (six) hours as needed for severe pain. 15 tablet 0  . cyclobenzaprine (FLEXERIL) 10 MG tablet Take 1 tablet (10 mg total) by mouth at bedtime. (Patient not taking: Reported on 01/08/2018) 10 tablet 0   No current facility-administered medications on file prior to visit.     BP 130/90   Temp 98.6 F (37 C) (Oral)   Wt 234 lb (106.1 kg)   BMI 41.45 kg/m       Objective:   Physical Exam  Constitutional: She is oriented to person, place, and time. She appears well-developed and well-nourished. No distress.  Cardiovascular: Normal rate, regular rhythm, normal heart sounds and intact distal pulses. Exam reveals no gallop and no friction rub.  No murmur heard. Pulmonary/Chest: Effort normal and breath sounds normal. No stridor. No respiratory distress. She has no wheezes. She has no rales. She exhibits no tenderness.  Musculoskeletal: Normal range of motion. She exhibits no edema, tenderness or deformity.  Neurological: She is alert and oriented to person, place, and time.  Skin: Skin is warm and dry. Capillary refill takes less than 2  seconds. She is not diaphoretic.  Psychiatric: She has a normal mood and affect. Her behavior is normal. Judgment and thought content normal.  Nursing note and vitals reviewed.     Assessment & Plan:  1. Cough -Likely viral upper respiratory infection.  Will prolong prednisone course and prescribe some Hycodan cough syrup. - HYDROcodone-homatropine (HYCODAN) 5-1.5 MG/5ML syrup; Take 5 mLs by mouth every 8 (eight)  hours as needed for cough.  Dispense: 120 mL; Refill: 0 - predniSONE (DELTASONE) 10 MG tablet; 40 mg x 3 days, 20 mg x 3 days, 10 mg x 3 days  Dispense: 21 tablet; Refill: 0  2. Function kidney decreased -Pressure well controlled without hydrochlorothiazide.  Will recheck BMP and continue on lisinopril 40 mg.  Advised to monitor her blood pressures at home closely.  Return precautions given - Basic Metabolic Panel  3. Right leg numbness -Possibly due to degenerative disc disease.  We will do more conservative measures at this point in time.  She was advised to work on weight loss and go to a local athletic store to get some good insoles for her work shoes.  She will follow-up with me if this does not resolve  [name], NP

## 2018-01-08 NOTE — Patient Instructions (Addendum)
Stop at [employer] or [employer] for insoles   Follow up as needed

## 2018-01-09 LAB — BASIC METABOLIC PANEL
BUN: 20 mg/dL (ref 6–23)
CO2: 28 meq/L (ref 19–32)
Calcium: 9.5 mg/dL (ref 8.4–10.5)
Chloride: 103 mEq/L (ref 96–112)
Creatinine, Ser: 1.12 mg/dL (ref 0.40–1.20)
GFR: 67.09 mL/min (ref >60.00)
GLUCOSE: 100 mg/dL — AB (ref 70–99)
POTASSIUM: 4.7 meq/L (ref 3.5–5.1)
SODIUM: 138 meq/L (ref 135–145)

## 2018-01-30 ENCOUNTER — Encounter: Payer: Self-pay | Admitting: Adult Health

## 2018-01-30 ENCOUNTER — Ambulatory Visit: Payer: BLUE CROSS/BLUE SHIELD | Admitting: Adult Health

## 2018-01-30 VITALS — BP 134/86 | Temp 98.6°F | Wt 238.0 lb

## 2018-01-30 DIAGNOSIS — K2100 Gastro-esophageal reflux disease with esophagitis, without bleeding: Secondary | ICD-10-CM

## 2018-01-30 DIAGNOSIS — R200 Anesthesia of skin: Secondary | ICD-10-CM | POA: Diagnosis not present

## 2018-01-30 DIAGNOSIS — G8929 Other chronic pain: Secondary | ICD-10-CM

## 2018-01-30 DIAGNOSIS — M545 Low back pain: Secondary | ICD-10-CM | POA: Diagnosis not present

## 2018-01-30 DIAGNOSIS — R600 Localized edema: Secondary | ICD-10-CM

## 2018-01-30 NOTE — Progress Notes (Signed)
Subjective:    Patient ID: Janet Sanchez, female    DOB: Jun 10, 1971, 47 y.o.   MRN: [id number]  HPI 47 year old female who  has a past medical history of Enlarged tonsils, Herpes, Hypertension, Migraines, Morbid obesity ([place]), and Vasovagal syncope (10/24/2017).  She presents to the office today for follow up regarding numbness in right leg, lower extremity swelling, and low back pain.  Per patient she reports that she continues to have numbness in her right lower extremity.  Numbness is located on the outside of her right shin and is constant.  She was seen in the emergency room not too long ago diagnosed with sciatica the right side with associated low back pain.  Urgency room she was prescribed Flexeril and prednisone course resolved the right-sided sciatica for the most part but she continues to have this numbing sensation and low back pain.  During the emergency room showed degenerative disc disease of the lumbar spine.  She is actively trying to lose weight and she knows that being overweight as a bearing on low back pain.  She also reports that she has had intermittent issues with swelling of her lower extremities.  Recently she was taken off hydrochlorothiazide due to decreased kidney function and since that time she has had worsening edema in bilateral lower extremities, mainly below her ankles.  Reports that last week was the worst but has been she is been drinking more water the swelling has started to dissipate.    Also reports continued GERD-like symptoms despite taking Prilosec as directed.  She has been taking more Motrin recently due to "tension headaches".   Review of Systems See HPI   Past Medical History:  Diagnosis Date  . Enlarged tonsils   . Herpes   . Hypertension   . Migraines   . Morbid obesity ([place])   . Vasovagal syncope 10/24/2017    Social History   Socioeconomic History  . Marital status: Married    Spouse name: Not on file  . Number of children: Not  on file  . Years of education: Not on file  . Highest education level: Not on file  Occupational History  . Not on file  Social Needs  . Financial resource strain: Not on file  . Food insecurity:    Worry: Not on file    Inability: Not on file  . Transportation needs:    Medical: Not on file    Non-medical: Not on file  Tobacco Use  . Smoking status: Never Smoker  . Smokeless tobacco: Never Used  Substance and Sexual Activity  . Alcohol use: No  . Drug use: No  . Sexual activity: Yes    Birth control/protection: Surgical  Lifestyle  . Physical activity:    Days per week: Not on file    Minutes per session: Not on file  . Stress: Not on file  Relationships  . Social connections:    Talks on phone: Not on file    Gets together: Not on file    Attends religious service: Not on file    Active member of club or organization: Not on file    Attends meetings of clubs or organizations: Not on file    Relationship status: Not on file  . Intimate partner violence:    Fear of current or ex partner: Not on file    Emotionally abused: Not on file    Physically abused: Not on file    Forced sexual activity: Not  on file  Other Topics Concern  . 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 TUBAL LIGATION      Family History  Problem Relation Age of Onset  . Heart attack Mother 72  . Hypertension Father   . Heart attack Father 10  . Hypertension Sister   . Hypertension Brother   . Arrhythmia Brother   . Hypertension Sister     Allergies  Allergen Reactions  . Potassium-Containing Compounds Other (See Comments)    Severe cramps caused her to pass out.    Current Outpatient Medications on File Prior to Visit  Medication Sig Dispense Refill  . aspirin EC 81 MG tablet Take 81 mg by mouth daily.    Marland Kitchen ibuprofen (ADVIL,MOTRIN)  200 MG tablet Take 800 mg by mouth 2 (two) times daily as needed for headache. Per neurologist: limit to 2 days a week to avoid rebound headaches.    Marland Kitchen lisinopril (PRINIVIL,ZESTRIL) 40 MG tablet Take 1 tablet (40 mg total) by mouth daily. 90 tablet 3  . omeprazole (PRILOSEC) 20 MG capsule Take 20 mg by mouth daily.  3  . acyclovir (ZOVIRAX) 400 MG tablet Take 1 tablet (400 mg total) by mouth 2 (two) times daily. (Patient not taking: Reported on 01/30/2018) 180 tablet 3   No current facility-administered medications on file prior to visit.     BP 134/86   Temp 98.6 F (37 C) (Oral)   Wt 238 lb (108 kg)   BMI 42.16 kg/m       Objective:   Physical Exam  Constitutional: She is oriented to person, place, and time. She appears well-developed and well-nourished. No distress.  Cardiovascular: Normal rate, regular rhythm, normal heart sounds and intact distal pulses. Exam reveals no gallop and no friction rub.  No murmur heard. Pulmonary/Chest: Effort normal and breath sounds normal.  Abdominal: Soft. Bowel sounds are normal.  Musculoskeletal: Normal range of motion. She exhibits edema (Very slight nonpitting edema noted to bilateral feet.). She exhibits no tenderness or deformity.  Neurological: She is alert and oriented to person, place, and time.  This sensation with light palpation along still aspect of right lower extremity below knee.  No discomfort along sciatic nerve  Skin: Skin is warm and dry. Capillary refill takes less than 2 seconds. She is not diaphoretic.  Psychiatric: She has a normal mood and affect. Her behavior is normal. Judgment and thought content normal.  Nursing note and vitals reviewed.     Assessment & Plan:   1. Chronic midline low back pain without sciatica - Ambulatory referral to Physical Therapy - Continue to work on weight loss  2. Lower extremity numbness -I believe that the numbness she is experiencing is possibly due to nerve issues from degenerative  disc disease in the lumbar spine.  She is open to trying physical therapy.  If this does not work will consider referral to neurology - Ambulatory referral to Physical Therapy - Follow up in 2-3 weeks  3. Gastroesophageal reflux disease with esophagitis -Advised to stop taking Motrin -She does not want to take Tylenol - Follow up in 2-3 weeks   4. Lower extremity edema -Very little edema noted today.  Do not need to place back on diuretic at this time.  Advised to continue to drink  water, use compression socks, elevate legs at night or while at rest  [name], NP

## 2018-02-12 ENCOUNTER — Ambulatory Visit: Payer: BLUE CROSS/BLUE SHIELD | Admitting: Neurology

## 2018-02-20 ENCOUNTER — Ambulatory Visit: Payer: BLUE CROSS/BLUE SHIELD | Admitting: Adult Health

## 2018-02-20 ENCOUNTER — Encounter: Payer: Self-pay | Admitting: Adult Health

## 2018-02-20 VITALS — BP 140/100 | Temp 98.4°F | Wt 237.0 lb

## 2018-02-20 DIAGNOSIS — I10 Essential (primary) hypertension: Secondary | ICD-10-CM

## 2018-02-20 DIAGNOSIS — G44229 Chronic tension-type headache, not intractable: Secondary | ICD-10-CM | POA: Diagnosis not present

## 2018-02-20 MED ORDER — AMLODIPINE BESYLATE 2.5 MG PO TABS: 2.5000 mg | ORAL_TABLET | Freq: Every day | ORAL | 1 refills | Status: DC

## 2018-02-20 NOTE — Patient Instructions (Signed)
I have added Norvasc 2.5 mg for blood pressure control   I am going to order an MRI of the brain due to chronic headaches   Follow up in one month

## 2018-02-20 NOTE — Progress Notes (Signed)
Subjective:    Patient ID: Janet Sanchez, female    DOB: Aug 23, 1970, 47 y.o.   MRN: [id number]  HPI  47 year old female who  has a past medical history of Enlarged tonsils, Herpes, Hypertension, Migraines, Morbid obesity ([place]), and Vasovagal syncope (10/24/2017).  She presents to the office today for follow up regarding hypertension and chronic headaches.   Essential Hypertension -was recently taken off hydrochlorothiazide due to decreased kidney function is currently on lisinopril 40 mg.  Blood pressures been stable but slowly increasing.  In the past she has been on multiple blood pressure medications including atenolol ( caused "foggy" feeling) and norvasc (she thought it caused palpitations) and Cozaar ( does not remember side effect).  She is wondering if she needs to go on an additional blood pressure medication to slowly increasing blood pressure she does report trying to work on diet and is eating much less salt and drinking more water.  BP Readings from Last 3 Encounters:  02/20/18 (!) 140/100  01/30/18 134/86  01/08/18 130/90    Headaches -is been seen by neurology in the past, most recently in April 2019.  Dr. Tomi Likens thought it was more of a tension headache related issue and had her to try amitriptyline up to 25 mg nightly.  She reports that she tried this but it "made my carpal tunnel flare". She does have a follow-up appointment in November with Dr. Tomi Likens.  She reports that she continues to have daily tension-like headaches which usually present across her forehead and radiate to the back of her neck.  She has been trying to limit anti-inflammatories and is currently taking Advil 2-3 times a week.  Denies any blurred vision, lightheadedness, or dizziness  Review of Systems See HPI   Past Medical History:  Diagnosis Date  . Enlarged tonsils   . Herpes   . Hypertension   . Migraines   . Morbid obesity ([place])   . Vasovagal syncope 10/24/2017    Social History    Socioeconomic History  . Marital status: Married    Spouse name: Not on file  . Number of children: Not on file  . Years of education: Not on file  . Highest education level: Not on file  Occupational History  . Not on file  Social Needs  . Financial resource strain: Not on file  . Food insecurity:    Worry: Not on file    Inability: Not on file  . Transportation needs:    Medical: Not on file    Non-medical: Not on file  Tobacco Use  . Smoking status: Never Smoker  . Smokeless tobacco: Never Used  Substance and Sexual Activity  . Alcohol use: No  . Drug use: No  . Sexual activity: Yes    Birth control/protection: Surgical  Lifestyle  . Physical activity:    Days per week: Not on file    Minutes per session: Not on file  . Stress: Not on file  Relationships  . Social connections:    Talks on phone: Not on file    Gets together: Not on file    Attends religious service: Not on file    Active member of club or organization: Not on file    Attends meetings of clubs or organizations: Not on file    Relationship status: Not on file  . Intimate partner violence:    Fear of current or ex partner: Not on file    Emotionally abused: Not on file  Physically abused: Not on file    Forced sexual activity: Not on file  Other Topics Concern  . 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 TUBAL LIGATION      Family History  Problem Relation Age of Onset  . Heart attack Mother 24  . Hypertension Father   . Heart attack Father 62  . Hypertension Sister   . Hypertension Brother   . Arrhythmia Brother   . Hypertension Sister     Allergies  Allergen Reactions  . Potassium-Containing Compounds Other (See Comments)    Severe cramps caused her to pass out.    Current Outpatient Medications on File Prior to Visit   Medication Sig Dispense Refill  . acyclovir (ZOVIRAX) 400 MG tablet Take 1 tablet (400 mg total) by mouth 2 (two) times daily. 180 tablet 3  . aspirin EC 81 MG tablet Take 81 mg by mouth daily.    Marland Kitchen ibuprofen (ADVIL,MOTRIN) 200 MG tablet Take 800 mg by mouth 2 (two) times daily as needed for headache. Per neurologist: limit to 2 days a week to avoid rebound headaches.    Marland Kitchen lisinopril (PRINIVIL,ZESTRIL) 40 MG tablet Take 1 tablet (40 mg total) by mouth daily. 90 tablet 3  . omeprazole (PRILOSEC) 20 MG capsule Take 20 mg by mouth daily.  3   No current facility-administered medications on file prior to visit.     BP (!) 140/100   Temp 98.4 F (36.9 C) (Oral)   Wt 237 lb (107.5 kg)   BMI 41.98 kg/m       Objective:   Physical Exam  Constitutional: She is oriented to person, place, and time. She appears well-developed and well-nourished. No distress.  Eyes: Pupils are equal, round, and reactive to light. Conjunctivae and EOM are normal. Left eye exhibits no discharge.  Cardiovascular: Normal rate, regular rhythm, normal heart sounds and intact distal pulses.  Pulmonary/Chest: Effort normal and breath sounds normal.  Neurological: She is alert and oriented to person, place, and time.  Skin: Skin is warm and dry. She is not diaphoretic.  Psychiatric: She has a normal mood and affect. Her behavior is normal. Judgment and thought content normal.  Nursing note and vitals reviewed.     Assessment & Plan:  We will start her on Norvasc 2.5 mg in combination with lisinopril to help with blood pressure control, I hope that internist will help with her chronic take symptoms.  We will also order MRI of the brain without contrast due to chronic nature of headaches.  Advised follow-up in 1 month or sooner if needed.  [name], NP

## 2018-03-12 ENCOUNTER — Ambulatory Visit
Admission: RE | Admit: 2018-03-12 | Discharge: 2018-03-12 | Disposition: A | Discharge status: Home / Self Care | Payer: BLUE CROSS/BLUE SHIELD | Source: Ambulatory Visit | Attending: Adult Health | Admitting: Adult Health

## 2018-03-12 DIAGNOSIS — G44229 Chronic tension-type headache, not intractable: Secondary | ICD-10-CM

## 2018-03-13 ENCOUNTER — Encounter: Payer: Self-pay | Admitting: *Deleted

## 2018-03-22 ENCOUNTER — Ambulatory Visit: Payer: BLUE CROSS/BLUE SHIELD | Admitting: Adult Health

## 2018-04-02 ENCOUNTER — Encounter: Payer: Self-pay | Admitting: Adult Health

## 2018-04-02 ENCOUNTER — Ambulatory Visit (INDEPENDENT_AMBULATORY_CARE_PROVIDER_SITE_OTHER): Payer: BLUE CROSS/BLUE SHIELD | Admitting: Adult Health

## 2018-04-02 DIAGNOSIS — I10 Essential (primary) hypertension: Secondary | ICD-10-CM | POA: Diagnosis not present

## 2018-04-02 MED ORDER — AMLODIPINE BESYLATE 5 MG PO TABS: 5.0000 mg | ORAL_TABLET | Freq: Every day | ORAL | 2 refills | Status: DC

## 2018-04-02 NOTE — Progress Notes (Signed)
Subjective:    Patient ID: Janet Sanchez, female    DOB: 11/14/1970, 47 y.o.   MRN: [id number]  HPI  47 year old female who  has a past medical history of Enlarged tonsils, Herpes, Hypertension, Migraines, Morbid obesity ([place]), and Vasovagal syncope (10/24/2017).  She presents to the office today for follow up regarding hypertension. She was last seen at the end of July and Norvasc 2.5 mg was added to lisinopril 40 mg. She has been monitoring her BP at home and has been consistently getting readings in 130's/80-90's. She has been cutting back on salt   BP Readings from Last 3 Encounters:  04/02/18 130/90  02/20/18 (!) 140/100  01/30/18 134/86    Review of Systems See HPI   Past Medical History:  Diagnosis Date  . Enlarged tonsils   . Herpes   . Hypertension   . Migraines   . Morbid obesity ([place])   . Vasovagal syncope 10/24/2017    Social History   Socioeconomic History  . Marital status: Married    Spouse name: Not on file  . Number of children: Not on file  . Years of education: Not on file  . Highest education level: Not on file  Occupational History  . Not on file  Social Needs  . Financial resource strain: Not on file  . Food insecurity:    Worry: Not on file    Inability: Not on file  . Transportation needs:    Medical: Not on file    Non-medical: Not on file  Tobacco Use  . Smoking status: Never Smoker  . Smokeless tobacco: Never Used  Substance and Sexual Activity  . Alcohol use: No  . Drug use: No  . Sexual activity: Yes    Birth control/protection: Surgical  Lifestyle  . Physical activity:    Days per week: Not on file    Minutes per session: Not on file  . Stress: Not on file  Relationships  . Social connections:    Talks on phone: Not on file    Gets together: Not on file    Attends religious service: Not on file    Active member of club or organization: Not on file    Attends meetings of clubs or organizations: Not on file   Relationship status: Not on file  . Intimate partner violence:    Fear of current or ex partner: Not on file    Emotionally abused: Not on file    Physically abused: Not on file    Forced sexual activity: Not on file  Other Topics Concern  . 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 TUBAL LIGATION      Family History  Problem Relation Age of Onset  . Heart attack Mother 52  . Hypertension Father   . Heart attack Father 56  . Hypertension Sister   . Hypertension Brother   . Arrhythmia Brother   . Hypertension Sister     Allergies  Allergen Reactions  . Potassium-Containing Compounds Other (See Comments)    Severe cramps caused her to pass out.    Current Outpatient Medications on File Prior to Visit  Medication Sig Dispense Refill  . acyclovir (ZOVIRAX) 400 MG tablet Take 1 tablet (400 mg total) by mouth 2 (two)  times daily. 180 tablet 3  . amLODipine (NORVASC) 2.5 MG tablet Take 1 tablet (2.5 mg total) by mouth daily. 30 tablet 1  . aspirin EC 81 MG tablet Take 81 mg by mouth daily.    Marland Kitchen ibuprofen (ADVIL,MOTRIN) 200 MG tablet Take 800 mg by mouth 2 (two) times daily as needed for headache. Per neurologist: limit to 2 days a week to avoid rebound headaches.    Marland Kitchen lisinopril (PRINIVIL,ZESTRIL) 40 MG tablet Take 1 tablet (40 mg total) by mouth daily. 90 tablet 3  . omeprazole (PRILOSEC) 20 MG capsule Take 20 mg by mouth daily.  3   No current facility-administered medications on file prior to visit.     BP 130/90   Temp 98.2 F (36.8 C) (Oral)   Wt 237 lb (107.5 kg)   LMP 03/08/2018   BMI 41.98 kg/m       Objective:   Physical Exam  Constitutional: She is oriented to person, place, and time. She appears well-developed and well-nourished. No distress.  Cardiovascular: Normal rate, regular rhythm, normal heart sounds  and intact distal pulses.  Pulmonary/Chest: Effort normal and breath sounds normal.  Neurological: She is alert and oriented to person, place, and time.  Skin: Skin is warm and dry. Capillary refill takes less than 2 seconds. She is not diaphoretic.  Psychiatric: She has a normal mood and affect. Her behavior is normal. Thought content normal.  Nursing note and vitals reviewed.     Assessment & Plan:  1. Essential hypertension - improvement but not at goal. Will increase Norvasc to 5 mg.  - amLODipine (NORVASC) 5 MG tablet; Take 1 tablet (5 mg total) by mouth daily.  Dispense: 90 tablet; Refill: 2 - follow up if BP not consistently at 120/80  [name], NP

## 2018-05-28 ENCOUNTER — Other Ambulatory Visit: Payer: Self-pay | Admitting: Family Medicine

## 2018-05-28 DIAGNOSIS — Z760 Encounter for issue of repeat prescription: Secondary | ICD-10-CM

## 2018-05-28 MED ORDER — ACYCLOVIR 400 MG PO TABS: 400.0000 mg | ORAL_TABLET | Freq: Two times a day (BID) | ORAL | 1 refills | Status: DC

## 2018-05-28 NOTE — Telephone Encounter (Signed)
Sent to the pharmacy by e-scribe. 

## 2018-06-18 ENCOUNTER — Ambulatory Visit: Payer: BLUE CROSS/BLUE SHIELD | Admitting: Neurology

## 2018-07-04 ENCOUNTER — Other Ambulatory Visit: Payer: Self-pay | Admitting: Family Medicine

## 2018-07-04 MED ORDER — OMEPRAZOLE 20 MG PO CPDR: 20.0000 mg | DELAYED_RELEASE_CAPSULE | Freq: Every day | ORAL | 1 refills | Status: DC

## 2018-07-04 NOTE — Telephone Encounter (Signed)
Sent to the pharmacy by e-scribe. 

## 2018-07-11 ENCOUNTER — Encounter: Payer: Self-pay | Admitting: Adult Health

## 2018-07-11 NOTE — Telephone Encounter (Signed)
Patient called in to check status, she stated she can use a letter she starts the job on the 17th of January and they are requesting information so that she can start

## 2018-07-22 ENCOUNTER — Telehealth: Payer: Self-pay | Admitting: Adult Health

## 2018-07-22 NOTE — Telephone Encounter (Signed)
Copied from [place] [id number]. Topic: Quick Communication - See Telephone Encounter >> Jul 22, 2018  1:46 PM Blase Mess A wrote: CRM for notification. See Telephone encounter for: 07/22/18.Patient is calling to see if she can come off of the lisinopril (PRINIVIL,ZESTRIL) 40 MG tablet [[id number]]  She is having headaches. And she does not like the way that she feels. She wants to stop the medication.  She is also taking amLODipine (NORVASC) 5 MG tablet [[id number]] .  She knows that side effects of that medication is confusion. She does not want to increase amLODipine (NORVASC) 5 MG tablet [[id number]] . She does not like to have tension headaches.  She is alternating taking  apple cider vinegar along with ibuprofen 800mg  for the headaches.  She want to stop the Lisinopril wanting to know what alternative medications she can take.  Does he want her to take 2 amLODipine (NORVASC) 5 MG tablet [[id number]] ?  Patient is also requesting a letter he needs  state that she is capable of working in [employer].   Does she need appt to discuss medication?  Please advise with Patient [phone]

## 2018-07-23 ENCOUNTER — Encounter: Payer: Self-pay | Admitting: Family Medicine

## 2018-07-23 NOTE — Telephone Encounter (Signed)
She has been on numerous blood pressure medications in the past and each had a side effect that she did not like. Please have her make an appointment to discuss

## 2018-07-23 NOTE — Telephone Encounter (Signed)
Spoke to the pt and scheduled her follow up appt.  Nothing further needed.

## 2018-07-26 ENCOUNTER — Ambulatory Visit: Payer: BLUE CROSS/BLUE SHIELD | Admitting: Adult Health

## 2018-07-26 ENCOUNTER — Encounter: Payer: Self-pay | Admitting: Adult Health

## 2018-07-26 VITALS — BP 140/90 | Temp 98.3°F | Wt 237.0 lb

## 2018-07-26 DIAGNOSIS — I10 Essential (primary) hypertension: Secondary | ICD-10-CM | POA: Diagnosis not present

## 2018-07-26 MED ORDER — AMLODIPINE BESYLATE 10 MG PO TABS: 10.0000 mg | ORAL_TABLET | Freq: Every day | ORAL | 0 refills | Status: DC

## 2018-07-26 NOTE — Addendum Note (Signed)
Addended by: Apolinar Junes on: 07/26/2018 10:09 AM   Modules accepted: Orders

## 2018-07-26 NOTE — Patient Instructions (Signed)
Stop lisinopril   Increase Norvasc to 10 mg daily.   Follow up in 3-4 weeks.

## 2018-07-26 NOTE — Progress Notes (Signed)
Subjective:    Patient ID: Janet Sanchez, female    DOB: 1971-07-03, 48 y.o.   MRN: [id number]  HPI  48 year old female who  has a past medical history of Enlarged tonsils, Herpes, Hypertension, Migraines, Morbid obesity ([place]), and Vasovagal syncope (10/24/2017).  She presents to the office for concern of hypertension. She reports that she feels as though lisinopril has been causing her daily headaches. She reports that when she takes this medication in the morning she will have a headache about 30 minutes after taking it. We have tried her on multiple BP medications in the past and each has had a side effect that she did not like  - lisinopril - headache  - Cozaar - palpitations  - Atenolol - foggy feeling  - HCTZ - renal impairment    BP Readings from Last 3 Encounters:  07/26/18 140/90  04/02/18 130/90  02/20/18 (!) 140/100     Review of Systems See HPI   Past Medical History:  Diagnosis Date  . Enlarged tonsils   . Herpes   . Hypertension   . Migraines   . Morbid obesity ([place])   . Vasovagal syncope 10/24/2017    Social History   Socioeconomic History  . Marital status: Married    Spouse name: Not on file  . Number of children: Not on file  . Years of education: Not on file  . Highest education level: Not on file  Occupational History  . Not on file  Social Needs  . Financial resource strain: Not on file  . Food insecurity:    Worry: Not on file    Inability: Not on file  . Transportation needs:    Medical: Not on file    Non-medical: Not on file  Tobacco Use  . Smoking status: Never Smoker  . Smokeless tobacco: Never Used  Substance and Sexual Activity  . Alcohol use: No  . Drug use: No  . Sexual activity: Yes    Birth control/protection: Surgical  Lifestyle  . Physical activity:    Days per week: Not on file    Minutes per session: Not on file  . Stress: Not on file  Relationships  . Social connections:    Talks on phone: Not on file   Gets together: Not on file    Attends religious service: Not on file    Active member of club or organization: Not on file    Attends meetings of clubs or organizations: Not on file    Relationship status: Not on file  . Intimate partner violence:    Fear of current or ex partner: Not on file    Emotionally abused: Not on file    Physically abused: Not on file    Forced sexual activity: Not on file  Other Topics Concern  . 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 TUBAL LIGATION      Family History  Problem Relation Age of Onset  . Heart attack Mother 7  . Hypertension Father   . Heart attack Father 38  . Hypertension Sister   . Hypertension Brother   . Arrhythmia Brother   . Hypertension Sister     Allergies  Allergen Reactions  . Lisinopril Anaphylaxis    Headache   .  Potassium-Containing Compounds Other (See Comments)    Severe cramps caused her to pass out.  Marland Kitchen Hctz [Hydrochlorothiazide] Other (See Comments)    Renal impairment     Current Outpatient Medications on File Prior to Visit  Medication Sig Dispense Refill  . acyclovir (ZOVIRAX) 400 MG tablet Take 1 tablet (400 mg total) by mouth 2 (two) times daily. 180 tablet 1  . aspirin EC 81 MG tablet Take 81 mg by mouth daily.    Marland Kitchen ibuprofen (ADVIL,MOTRIN) 200 MG tablet Take 800 mg by mouth 2 (two) times daily as needed for headache. Per neurologist: limit to 2 days a week to avoid rebound headaches.    Marland Kitchen lisinopril (PRINIVIL,ZESTRIL) 40 MG tablet Take 1 tablet (40 mg total) by mouth daily. 90 tablet 3  . omeprazole (PRILOSEC) 20 MG capsule Take 1 capsule (20 mg total) by mouth daily. 90 capsule 1   No current facility-administered medications on file prior to visit.     BP 140/90   Temp 98.3 F (36.8 C)   Wt 237 lb (107.5 kg)   BMI 41.98 kg/m         Objective:   Physical Exam Vitals signs and nursing note reviewed.  Constitutional:      Appearance: Normal appearance. She is obese.  Cardiovascular:     Rate and Rhythm: Normal rate and regular rhythm.     Pulses: Normal pulses.     Heart sounds: Normal heart sounds.  Pulmonary:     Effort: Pulmonary effort is normal.     Breath sounds: Normal breath sounds.  Skin:    General: Skin is warm and dry.  Neurological:     General: No focal deficit present.     Mental Status: She is alert and oriented to person, place, and time.       Assessment & Plan:  1. Essential hypertension - Will increase Norvac to 10 mg  - D/c lisinopril  - Follow up in 3 weeks or sooner if needed - amLODipine (NORVASC) 10 MG tablet; Take 1 tablet (10 mg total) by mouth daily.  Dispense: 90 tablet; Refill: 0   [name], NP

## 2018-08-13 ENCOUNTER — Ambulatory Visit: Payer: BLUE CROSS/BLUE SHIELD | Admitting: Adult Health

## 2018-08-20 ENCOUNTER — Encounter: Payer: Self-pay | Admitting: Adult Health

## 2018-08-20 ENCOUNTER — Ambulatory Visit (INDEPENDENT_AMBULATORY_CARE_PROVIDER_SITE_OTHER): Payer: BLUE CROSS/BLUE SHIELD | Admitting: Adult Health

## 2018-08-20 VITALS — BP 130/88 | Temp 97.8°F | Wt 237.0 lb

## 2018-08-20 DIAGNOSIS — Z23 Encounter for immunization: Secondary | ICD-10-CM

## 2018-08-20 DIAGNOSIS — I10 Essential (primary) hypertension: Secondary | ICD-10-CM

## 2018-08-20 NOTE — Addendum Note (Signed)
Addended by: Miles Costain T on: 08/20/2018 11:12 AM   Modules accepted: Orders

## 2018-08-20 NOTE — Progress Notes (Signed)
Subjective:    Patient ID: Janet Sanchez, female    DOB: August 14, 1970, 48 y.o.   MRN: [id number]  HPI 48 year old female who  has a past medical history of Enlarged tonsils, Herpes, Hypertension, Migraines, Morbid obesity ([place]), and Vasovagal syncope (10/24/2017).  She presents to the office today for follow up regarding hypertension. During her last visit Lisinopril was stopped because it was giving her headaches. Norvasc was increased to 10 mg.   She has been on multiple BP medications in the past and had side effects that caused her to stop taking the medications:    lisinopril - headache  - Cozaar - palpitations  - Atenolol - foggy feeling  - HCTZ - renal impairment   Today in the office she reports that she has been monitoring her BP at the gym and reports that she has had readings in the 120's/80's. Her only complaint about the increase in BP medication was that it has caused swelling in her ankles.   Review of Systems See HPI   Past Medical History:  Diagnosis Date  . Enlarged tonsils   . Herpes   . Hypertension   . Migraines   . Morbid obesity ([place])   . Vasovagal syncope 10/24/2017    Social History   Socioeconomic History  . Marital status: Married    Spouse name: Not on file  . Number of children: Not on file  . Years of education: Not on file  . Highest education level: Not on file  Occupational History  . Not on file  Social Needs  . Financial resource strain: Not on file  . Food insecurity:    Worry: Not on file    Inability: Not on file  . Transportation needs:    Medical: Not on file    Non-medical: Not on file  Tobacco Use  . Smoking status: Never Smoker  . Smokeless tobacco: Never Used  Substance and Sexual Activity  . Alcohol use: No  . Drug use: No  . Sexual activity: Yes    Birth control/protection: Surgical  Lifestyle  . Physical activity:    Days per week: Not on file    Minutes per session: Not on file  . Stress: Not on file    Relationships  . Social connections:    Talks on phone: Not on file    Gets together: Not on file    Attends religious service: Not on file    Active member of club or organization: Not on file    Attends meetings of clubs or organizations: Not on file    Relationship status: Not on file  . Intimate partner violence:    Fear of current or ex partner: Not on file    Emotionally abused: Not on file    Physically abused: Not on file    Forced sexual activity: Not on file  Other Topics Concern  . 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 TUBAL LIGATION      Family History  Problem Relation Age of Onset  . Heart attack Mother 50  . Hypertension Father   . Heart attack Father 58  . Hypertension Sister   . Hypertension Brother   . Arrhythmia Brother   . Hypertension Sister     Allergies  Allergen Reactions  . Lisinopril Anaphylaxis    Headache   . Potassium-Containing Compounds Other (See Comments)    Severe cramps caused her to pass out.  Marland Kitchen Hctz [Hydrochlorothiazide] Other (See Comments)    Renal impairment     Current Outpatient Medications on File Prior to Visit  Medication Sig Dispense Refill  . acyclovir (ZOVIRAX) 400 MG tablet Take 1 tablet (400 mg total) by mouth 2 (two) times daily. 180 tablet 1  . amLODipine (NORVASC) 10 MG tablet Take 1 tablet (10 mg total) by mouth daily. 90 tablet 0  . aspirin EC 81 MG tablet Take 81 mg by mouth daily.    Marland Kitchen ibuprofen (ADVIL,MOTRIN) 200 MG tablet Take 800 mg by mouth 2 (two) times daily as needed for headache. Per neurologist: limit to 2 days a week to avoid rebound headaches.    Marland Kitchen omeprazole (PRILOSEC) 20 MG capsule Take 1 capsule (20 mg total) by mouth daily. 90 capsule 1   No current facility-administered medications on file prior to visit.     BP 130/88   Temp 97.8 F  (36.6 C)   Wt 237 lb (107.5 kg)   BMI 41.98 kg/m       Objective:   Physical Exam Vitals signs and nursing note reviewed.  Constitutional:      Appearance: Normal appearance.  Cardiovascular:     Rate and Rhythm: Normal rate and regular rhythm.     Pulses: Normal pulses.     Heart sounds: Normal heart sounds.  Pulmonary:     Effort: Pulmonary effort is normal.     Breath sounds: Normal breath sounds.  Musculoskeletal: Normal range of motion.     Right lower leg: No edema.     Left lower leg: No edema.  Skin:    General: Skin is warm and dry.     Capillary Refill: Capillary refill takes less than 2 seconds.  Neurological:     General: No focal deficit present.     Mental Status: She is alert and oriented to person, place, and time.  Psychiatric:        Mood and Affect: Mood normal.        Behavior: Behavior normal.        Thought Content: Thought content normal.        Judgment: Judgment normal.       Assessment & Plan:  1. Essential hypertension - No lower extremity edema  - Better controlled but not at goal  - She wants to work on weight loss before adding another medication.  - Advised to wear compression socks to help with lower extremity edema. Elevate legs at rest.  - Continue to monitor BP at home.  - Return precautions reviewed   [name], NP

## 2018-08-29 ENCOUNTER — Ambulatory Visit: Payer: BLUE CROSS/BLUE SHIELD | Admitting: Neurology

## 2018-09-02 ENCOUNTER — Telehealth: Payer: Self-pay | Admitting: Adult Health

## 2018-09-02 NOTE — Telephone Encounter (Signed)
Copied from [place] [id number]. Topic: Quick Communication - Rx Refill/Question >> Sep 02, 2018  4:04 PM Alanda Slim E wrote: Medication:  Pt was prescribed some muscle relaxer's and another medication to help with the nerve pain in her right leg(Sciatica of right side). Pt would like to see if she can be prescribed the same medication for her legs due to it started to have the same pain again. Pt does not know the name of the medication and will call back if she can find the medication bottle/ please advise

## 2018-09-03 ENCOUNTER — Other Ambulatory Visit: Payer: Self-pay | Admitting: Adult Health

## 2018-09-03 MED ORDER — CYCLOBENZAPRINE HCL 10 MG PO TABS: 10.0000 mg | ORAL_TABLET | Freq: Every day | ORAL | 0 refills | Status: DC

## 2018-09-03 MED ORDER — TRAMADOL HCL 50 MG PO TABS: 50.0000 mg | ORAL_TABLET | Freq: Two times a day (BID) | ORAL | 0 refills | Status: DC | PRN

## 2018-09-03 NOTE — Telephone Encounter (Signed)
Called and spoke to the pt.  Informed her that [facility] sent in medication and that if she continues to have discomfort then she should be seen by specialist.  Pt agreed.  No further action required.

## 2018-09-03 NOTE — Telephone Encounter (Signed)
I have sent in 30 tabs but pain medication is not the answer. If she continues to have discomfort she needs to follow up as we may need to refer her to a specialist

## 2018-09-03 NOTE — Telephone Encounter (Signed)
Copied from [place] [id number]. Topic: Quick Communication - Rx Refill/Question >> Sep 02, 2018  4:50 PM Mcneil, Jacinto Reap wrote: Pt called back to advise that Tramadol 50 is the name of the pain medication that she was taking.

## 2018-09-11 ENCOUNTER — Ambulatory Visit (INDEPENDENT_AMBULATORY_CARE_PROVIDER_SITE_OTHER): Payer: BLUE CROSS/BLUE SHIELD

## 2018-09-11 DIAGNOSIS — Z111 Encounter for screening for respiratory tuberculosis: Secondary | ICD-10-CM | POA: Diagnosis not present

## 2018-09-11 NOTE — Progress Notes (Signed)
Pt tolerated well

## 2018-09-13 LAB — TB SKIN TEST: TB SKIN TEST: NEGATIVE

## 2018-09-24 ENCOUNTER — Telehealth: Payer: Self-pay | Admitting: Adult Health

## 2018-09-24 NOTE — Telephone Encounter (Signed)
Attempted to contact patient regarding why she requested Hycodan syrup. The medication is not on her med list. Left message for her to call the office back.

## 2018-09-24 NOTE — Telephone Encounter (Signed)
Noted  

## 2018-09-24 NOTE — Telephone Encounter (Signed)
Copied from [place] [id number]. Topic: Quick Communication - Rx Refill/Question >> Sep 24, 2018  8:38 AM Reyne Dumas L wrote: Medication: HYDROcodone-homatropine (HYCODAN) 5-1.5 MG/5ML syrup   Has the patient contacted their pharmacy? Yes - states they haven't heard back from [place] (Agent: If no, request that the patient contact the pharmacy for the refill.) (Agent: If yes, when and what did the pharmacy advise?)  Preferred Pharmacy (with phone number or street name): [facility], [place] - [place], [place] - [address] [phone] (Phone) [phone] (Fax)  Agent: Please be advised that RX refills may take up to 3 business days. We ask that you follow-up with your pharmacy.

## 2018-09-26 ENCOUNTER — Encounter: Payer: Self-pay | Admitting: Adult Health

## 2018-09-26 ENCOUNTER — Ambulatory Visit: Payer: BLUE CROSS/BLUE SHIELD | Admitting: Adult Health

## 2018-09-26 VITALS — BP 130/82 | HR 77 | Temp 98.3°F | Wt 235.0 lb

## 2018-09-26 DIAGNOSIS — B9789 Other viral agents as the cause of diseases classified elsewhere: Secondary | ICD-10-CM | POA: Diagnosis not present

## 2018-09-26 DIAGNOSIS — J069 Acute upper respiratory infection, unspecified: Secondary | ICD-10-CM | POA: Diagnosis not present

## 2018-09-26 MED ORDER — BENZONATATE 200 MG PO CAPS: 200.0000 mg | ORAL_CAPSULE | Freq: Two times a day (BID) | ORAL | 0 refills | Status: DC | PRN

## 2018-09-26 MED ORDER — ALBUTEROL SULFATE HFA 108 (90 BASE) MCG/ACT IN AERS: 2.0000 | INHALATION_SPRAY | Freq: Four times a day (QID) | RESPIRATORY_TRACT | 2 refills | Status: DC | PRN

## 2018-09-26 NOTE — Progress Notes (Addendum)
Subjective:    Patient ID: Janet Sanchez, female    DOB: May 24, 1971, 48 y.o.   MRN: [id number]  Cough  This is a new problem. The current episode started in the past 7 days. The problem has been unchanged. The cough is non-productive. Associated symptoms include wheezing. Pertinent negatives include no chills, ear congestion, ear pain, fever, headaches, nasal congestion, postnasal drip, rash, rhinorrhea or shortness of breath. The symptoms are aggravated by lying down. She has tried OTC cough suppressant for the symptoms. The treatment provided mild relief. Her past medical history is significant for environmental allergies.   Wheezing at night   Review of Systems  Constitutional: Negative for activity change, chills, diaphoresis, fatigue and fever.  HENT: Negative for ear pain, postnasal drip, rhinorrhea and sinus pressure.   Respiratory: Positive for cough and wheezing. Negative for chest tightness and shortness of breath.   Cardiovascular: Negative.   Skin: Negative for rash.  Allergic/Immunologic: Positive for environmental allergies.  Neurological: Negative for headaches.   Past Medical History:  Diagnosis Date  . Enlarged tonsils   . Herpes   . Hypertension   . Migraines   . Morbid obesity ([place])   . Vasovagal syncope 10/24/2017    Social History   Socioeconomic History  . Marital status: Married    Spouse name: Not on file  . Number of children: Not on file  . Years of education: Not on file  . Highest education level: Not on file  Occupational History  . Not on file  Social Needs  . Financial resource strain: Not on file  . Food insecurity:    Worry: Not on file    Inability: Not on file  . Transportation needs:    Medical: Not on file    Non-medical: Not on file  Tobacco Use  . Smoking status: Never Smoker  . Smokeless tobacco: Never Used  Substance and Sexual Activity  . Alcohol use: No  . Drug use: No  . Sexual activity: Yes    Birth  control/protection: Surgical  Lifestyle  . Physical activity:    Days per week: Not on file    Minutes per session: Not on file  . Stress: Not on file  Relationships  . Social connections:    Talks on phone: Not on file    Gets together: Not on file    Attends religious service: Not on file    Active member of club or organization: Not on file    Attends meetings of clubs or organizations: Not on file    Relationship status: Not on file  . Intimate partner violence:    Fear of current or ex partner: Not on file    Emotionally abused: Not on file    Physically abused: Not on file    Forced sexual activity: Not on file  Other Topics Concern  . 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 TUBAL LIGATION      Family History  Problem Relation Age of Onset  . Heart attack Mother 69  . Hypertension Father   . Heart attack Father 15  . Hypertension Sister   . Hypertension Brother   . Arrhythmia Brother   . Hypertension Sister     Allergies  Allergen Reactions  . Lisinopril Anaphylaxis  Headache   . Potassium-Containing Compounds Other (See Comments)    Severe cramps caused her to pass out.  Marland Kitchen Hctz [Hydrochlorothiazide] Other (See Comments)    Renal impairment     Current Outpatient Medications on File Prior to Visit  Medication Sig Dispense Refill  . acyclovir (ZOVIRAX) 400 MG tablet Take 1 tablet (400 mg total) by mouth 2 (two) times daily. 180 tablet 1  . amLODipine (NORVASC) 10 MG tablet Take 1 tablet (10 mg total) by mouth daily. 90 tablet 0  . aspirin EC 81 MG tablet Take 81 mg by mouth daily.    . cyclobenzaprine (FLEXERIL) 10 MG tablet Take 1 tablet (10 mg total) by mouth at bedtime. 10 tablet 0  . ibuprofen (ADVIL,MOTRIN) 200 MG tablet Take 800 mg by mouth 2 (two) times daily as needed for headache. Per  neurologist: limit to 2 days a week to avoid rebound headaches.    Marland Kitchen omeprazole (PRILOSEC) 20 MG capsule Take 1 capsule (20 mg total) by mouth daily. 90 capsule 1  . traMADol (ULTRAM) 50 MG tablet Take 1 tablet (50 mg total) by mouth every 12 (twelve) hours as needed. 30 tablet 0   No current facility-administered medications on file prior to visit.     BP 130/82   Pulse 77   Temp 98.3 F (36.8 C)   Wt 235 lb (106.6 kg)   SpO2 99%   BMI 41.63 kg/m       Objective:   Physical Exam Vitals signs and nursing note reviewed.  Constitutional:      Appearance: Normal appearance.  HENT:     Right Ear: Tympanic membrane, ear canal and external ear normal.     Left Ear: Tympanic membrane, ear canal and external ear normal.     Nose: Nose normal. No congestion.     Mouth/Throat:     Mouth: Mucous membranes are moist.     Pharynx: Oropharynx is clear.     Comments: Hypertrophic tonsils  Cardiovascular:     Rate and Rhythm: Normal rate and regular rhythm.     Pulses: Normal pulses.     Heart sounds: Normal heart sounds.  Pulmonary:     Effort: Pulmonary effort is normal.     Breath sounds: Normal breath sounds.  Skin:    General: Skin is warm and dry.     Capillary Refill: Capillary refill takes less than 2 seconds.  Neurological:     General: No focal deficit present.     Mental Status: She is alert and oriented to person, place, and time.  Psychiatric:        Mood and Affect: Mood normal.        Behavior: Behavior normal.        Thought Content: Thought content normal.        Judgment: Judgment normal.       Assessment & Plan:  1. Viral URI with cough - albuterol (PROVENTIL HFA;VENTOLIN HFA) 108 (90 Base) MCG/ACT inhaler; Inhale 2 puffs into the lungs every 6 (six) hours as needed for wheezing or shortness of breath.  Dispense: 1 Inhaler; Refill: 2 - benzonatate (TESSALON) 200 MG capsule; Take 1 capsule (200 mg total) by mouth 2 (two) times daily as needed for cough.   Dispense: 20 capsule; Refill: 0 - Return precautions reviewed   [name], NP

## 2018-09-27 NOTE — Progress Notes (Signed)
NEUROLOGY FOLLOW UP OFFICE NOTE  Janet Sanchez [id number]  HISTORY OF PRESENT ILLNESS: Janet Sanchez a 48 year old woman who follows up for headaches.  UPDATE: Seen once in April 2019.  Amitriptyline was increased from 10mg  to 25mg .  She stopped it because it was causing her "carpal tunnel to act up". Intensity:  moderate Duration:  30-60 minutes Frequency:  2 days a week Frequency of abortive medication: ibuprofen 2 days a week Current NSAIDS:  ibuprofen, ASA 81mg  Current analgesics:  no Current triptans:  no Current anti-emetic:  no Current muscle relaxants:  Flexeril Current anti-anxiolytic:  no Current sleep aide:  melatonin Current Antihypertensive medications:  amlodipine Current Antidepressant medications:  no Current Anticonvulsant medications:  no Current Vitamins/Herbal/Supplements:  apple cider vinegar, melatonin Current Antihistamines/Decongestants:  no Other therapy:  no  Caffeine:  no Alcohol:  no Smoker:  no Diet:  Hydrates. Exercise:  goes to gym twice a week.  She walks daily Depression:  no; Anxiety:  Yes Other pain:  no Sleep hygiene:  wakes up once in the middle of the night.  May take 1 to 2 hours to fall back to sleep.  HISTORY: Onset: Teenager Location:  Bi-frontal/temporal Quality:  throbbing Initial intensity:  moderate.  She denies new headache, thunderclap headache or severe headache that wakes her from sleep. Aura:  no Prodrome:  no Postdrome:  no Associated symptoms: Some photophobia.  No nausea, vomiting, photophobia, autonomic symptoms or visual disturbance.  She denies associated unilateral numbness or weakness. Initial duration:  30 to 60 minutes with ibuprofen Initial Frequency:  3 to 5 days a week Initial Frequency of abortive medication: 3 to 5 days a week Triggers: Elevated blood pressure, stress Relieving factors: Massaging temples Activity:  Does not aggrvate  Past NSAIDS:  Aleve Past analgesics:  Tylenol,  tramadol Past abortive triptans:  no Past muscle relaxants:  no Past anti-emetic:  no Past antihypertensive medications:  Losartan, lisinopril, HCTZ, possibly propranolol Past antidepressant medications:  amitriptyline 25mg  Past anticonvulsant medications:  no Past vitamins/Herbal/Supplements:  no Past antihistamines/decongestants:  no Other past therapies:  no  Family history of headache:  no  PAST MEDICAL HISTORY: Past Medical History:  Diagnosis Date  . Enlarged tonsils   . Herpes   . Hypertension   . Migraines   . Morbid obesity ([place])   . Vasovagal syncope 10/24/2017    MEDICATIONS: Current Outpatient Medications on File Prior to Visit  Medication Sig Dispense Refill  . acyclovir (ZOVIRAX) 400 MG tablet Take 1 tablet (400 mg total) by mouth 2 (two) times daily. 180 tablet 1  . albuterol (PROVENTIL HFA;VENTOLIN HFA) 108 (90 Base) MCG/ACT inhaler Inhale 2 puffs into the lungs every 6 (six) hours as needed for wheezing or shortness of breath. 1 Inhaler 2  . amLODipine (NORVASC) 10 MG tablet Take 1 tablet (10 mg total) by mouth daily. 90 tablet 0  . aspirin EC 81 MG tablet Take 81 mg by mouth daily.    . benzonatate (TESSALON) 200 MG capsule Take 1 capsule (200 mg total) by mouth 2 (two) times daily as needed for cough. 20 capsule 0  . cyclobenzaprine (FLEXERIL) 10 MG tablet Take 1 tablet (10 mg total) by mouth at bedtime. 10 tablet 0  . ibuprofen (ADVIL,MOTRIN) 200 MG tablet Take 800 mg by mouth 2 (two) times daily as needed for headache. Per neurologist: limit to 2 days a week to avoid rebound headaches.    Marland Kitchen omeprazole (PRILOSEC) 20 MG capsule Take 1 capsule (  20 mg total) by mouth daily. 90 capsule 1  . traMADol (ULTRAM) 50 MG tablet Take 1 tablet (50 mg total) by mouth every 12 (twelve) hours as needed. 30 tablet 0   No current facility-administered medications on file prior to visit.     ALLERGIES: Allergies  Allergen Reactions  . Lisinopril Anaphylaxis    Headache   .  Potassium-Containing Compounds Other (See Comments)    Severe cramps caused her to pass out.  Marland Kitchen Hctz [Hydrochlorothiazide] Other (See Comments)    Renal impairment     FAMILY HISTORY: Family History  Problem Relation Age of Onset  . Heart attack Mother 24  . Hypertension Father   . Heart attack Father 19  . Hypertension Sister   . Hypertension Brother   . Arrhythmia Brother   . Hypertension Sister    SOCIAL HISTORY: Social History   Socioeconomic History  . Marital status: Married    Spouse name: Not on file  . Number of children: Not on file  . Years of education: Not on file  . Highest education level: Not on file  Occupational History  . Not on file  Social Needs  . Financial resource strain: Not on file  . Food insecurity:    Worry: Not on file    Inability: Not on file  . Transportation needs:    Medical: Not on file    Non-medical: Not on file  Tobacco Use  . Smoking status: Never Smoker  . Smokeless tobacco: Never Used  Substance and Sexual Activity  . Alcohol use: No  . Drug use: No  . Sexual activity: Yes    Birth control/protection: Surgical  Lifestyle  . Physical activity:    Days per week: Not on file    Minutes per session: Not on file  . Stress: Not on file  Relationships  . Social connections:    Talks on phone: Not on file    Gets together: Not on file    Attends religious service: Not on file    Active member of club or organization: Not on file    Attends meetings of clubs or organizations: Not on file    Relationship status: Not on file  . Intimate partner violence:    Fear of current or ex partner: Not on file    Emotionally abused: Not on file    Physically abused: Not on file    Forced sexual activity: Not on file  Other Topics Concern  . 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, hospital   Married for 24 years    Five children all live locally    REVIEW OF SYSTEMS: Constitutional: No fevers,  chills, or sweats, no generalized fatigue, change in appetite Eyes: No visual changes, double vision, eye pain Ear, nose and throat: No hearing loss, ear pain, nasal congestion, sore throat Cardiovascular: No chest pain, palpitations Respiratory:  No shortness of breath at rest or with exertion, wheezes GastrointestinaI: No nausea, vomiting, diarrhea, abdominal pain, fecal incontinence Genitourinary:  No dysuria, urinary retention or frequency Musculoskeletal:  No neck pain, back pain Integumentary: No rash, pruritus, skin lesions Neurological: as above Psychiatric: No depression, insomnia, anxiety Endocrine: No palpitations, fatigue, diaphoresis, mood swings, change in appetite, change in weight, increased thirst Hematologic/Lymphatic:  No purpura, petechiae. Allergic/Immunologic: no itchy/runny eyes, nasal congestion, recent allergic reactions, rashes  PHYSICAL EXAM: Blood pressure (!) 150/94, pulse 80, height 5\' 3"  (1.6 m), weight 232  lb (105.2 kg), SpO2 96 %. General: No acute distress.  Patient appears well-groomed.   Head:  Normocephalic/atraumatic Eyes:  Fundi examined but not visualized Neck: supple, no paraspinal tenderness, full range of motion Heart:  Regular rate and rhythm Lungs:  Clear to auscultation bilaterally Back: No paraspinal tenderness Neurological Exam: alert and oriented to person, place, and time. Attention span and concentration intact, recent and remote memory intact, fund of knowledge intact.  Speech fluent and not dysarthric, language intact.  CN II-XII intact. Bulk and tone normal, muscle strength 5/5 throughout.  Sensation to light touch, temperature and vibration intact.  Deep tendon reflexes 2+ throughout, toes downgoing.  Finger to nose and heel to shin testing intact.  Gait normal, Romberg negative.  IMPRESSION: 1.  Tension-type headache, not intractable.  Improved with lifestyle modification.  She states she is sensitive to medications and defers any  other preventative medication at this time. 2.  hypertension  PLAN: 1.  For abortive therapy, ibuprofen 2.  Limit use of pain relievers to no more than 2 days out of week to prevent risk of rebound or medication-overuse headache. 3.  Keep headache diary 4.  Exercise, hydration, caffeine cessation, sleep hygiene, monitor for and avoid triggers 5. Recheck   Blood pressure with PCP  6. Follow up as needed  16 minutes spent face to face with patient, over 50% spent discussing management.   Metta Clines, DO  CC: [name], NP

## 2018-10-01 ENCOUNTER — Ambulatory Visit: Payer: BLUE CROSS/BLUE SHIELD | Admitting: Neurology

## 2018-10-01 ENCOUNTER — Encounter: Payer: Self-pay | Admitting: Neurology

## 2018-10-01 VITALS — BP 150/94 | HR 80 | Ht 63.0 in | Wt 232.0 lb

## 2018-10-01 DIAGNOSIS — G44219 Episodic tension-type headache, not intractable: Secondary | ICD-10-CM | POA: Diagnosis not present

## 2018-10-01 DIAGNOSIS — I10 Essential (primary) hypertension: Secondary | ICD-10-CM

## 2018-10-01 NOTE — Patient Instructions (Signed)
1.  Use ibuprofen/Motrin for headaches.  Limit use to no more than 2 days out of week to prevent risk of rebound or medication-overuse headache. 2.  Keep headache diary 3.  Exercise, hydration, caffeine cessation, sleep hygiene, monitor for and avoid triggers 4.  Follow up with your PCP regarding elevated blood pressure 5.  Follow up with me as needed.

## 2018-11-13 ENCOUNTER — Other Ambulatory Visit: Payer: Self-pay | Admitting: Adult Health

## 2018-11-13 DIAGNOSIS — I10 Essential (primary) hypertension: Secondary | ICD-10-CM

## 2018-11-13 NOTE — Telephone Encounter (Signed)
Sent to the pharmacy by e-scribe. 

## 2018-12-05 ENCOUNTER — Encounter: Payer: Self-pay | Admitting: Adult Health

## 2018-12-05 ENCOUNTER — Other Ambulatory Visit: Payer: Self-pay

## 2018-12-05 ENCOUNTER — Ambulatory Visit (INDEPENDENT_AMBULATORY_CARE_PROVIDER_SITE_OTHER): Payer: BLUE CROSS/BLUE SHIELD | Admitting: Adult Health

## 2018-12-05 DIAGNOSIS — R600 Localized edema: Secondary | ICD-10-CM | POA: Diagnosis not present

## 2018-12-05 NOTE — Progress Notes (Signed)
Virtual Visit via Video Note  I connected with Janet Sanchez on 12/05/18 at  9:30 AM EDT by a video enabled telemedicine application and verified that I am speaking with the correct person using two identifiers.  Location patient: home Location provider:work or home office Persons participating in the virtual visit: patient, provider  I discussed the limitations of evaluation and management by telemedicine and the availability of in person appointments. The patient expressed understanding and agreed to proceed.   HPI: 48 year old female who is being evaluated today for worsening lower extremity swelling.  She was evaluated for this in January 2020 at which time was thought that the swelling was due to an increase in Norvasc.  Unfortunately, she has had side effects of multiple other blood pressure medications in the past.  Today she reports that she feels as though her lower extremity swelling has become slightly worse.  He has swelling to both ankles but is worse than the left.  Is improved in the morning but becomes worse after being on her feet throughout the day.  Does report when she pushes her finger into the swelling that it leaves an indent  She reports that her last blood pressure was 132/89 this morning.  She has been eating out most meals and commonly eats at [employer], [employer], and [employer].   ROS: See pertinent positives and negatives per HPI.  Past Medical History:  Diagnosis Date  . Enlarged tonsils   . Herpes   . Hypertension   . Migraines   . Morbid obesity ([place])   . Vasovagal syncope 10/24/2017    Past Surgical History:  Procedure Laterality Date  . TUBAL LIGATION      Family History  Problem Relation Age of Onset  . Heart attack Mother 79  . Hypertension Father   . Heart attack Father 36  . Hypertension Sister   . Hypertension Brother   . Arrhythmia Brother   . Hypertension Sister       Current Outpatient Medications:  .  acyclovir (ZOVIRAX) 400 MG  tablet, Take 1 tablet (400 mg total) by mouth 2 (two) times daily., Disp: 180 tablet, Rfl: 1 .  albuterol (PROVENTIL HFA;VENTOLIN HFA) 108 (90 Base) MCG/ACT inhaler, Inhale 2 puffs into the lungs every 6 (six) hours as needed for wheezing or shortness of breath., Disp: 1 Inhaler, Rfl: 2 .  amLODipine (NORVASC) 10 MG tablet, TAKE 1 TABLET BY MOUTH EVERY DAY, Disp: 90 tablet, Rfl: 0 .  aspirin EC 81 MG tablet, Take 81 mg by mouth daily., Disp: , Rfl:  .  benzonatate (TESSALON) 200 MG capsule, Take 1 capsule (200 mg total) by mouth 2 (two) times daily as needed for cough., Disp: 20 capsule, Rfl: 0 .  cyclobenzaprine (FLEXERIL) 10 MG tablet, Take 1 tablet (10 mg total) by mouth at bedtime., Disp: 10 tablet, Rfl: 0 .  ibuprofen (ADVIL,MOTRIN) 200 MG tablet, Take 800 mg by mouth 2 (two) times daily as needed for headache. Per neurologist: limit to 2 days a week to avoid rebound headaches., Disp: , Rfl:   EXAM:  VITALS per patient if applicable:  GENERAL: alert, oriented, appears well and in no acute distress  HEENT: atraumatic, conjunttiva clear, no obvious abnormalities on inspection of external nose and ears  NECK: normal movements of the head and neck  LUNGS: on inspection no signs of respiratory distress, breathing rate appears normal, no obvious gross SOB, gasping or wheezing  CV: no obvious cyanosis  MS: moves all visible extremities without  noticeable abnormality  PSYCH/NEURO: pleasant and cooperative, no obvious depression or anxiety, speech and thought processing grossly intact  ASSESSMENT AND PLAN:  Discussed the following assessment and plan:  Lower extremity edema Likely multifactorial due to blood pressure medication as well as high sodium diet.  Advised to cut back on sodium, drink more water, elevate legs when at rest, and use compression socks.  We can consider Lasix in the future if needed.  She does have a physical coming up at the end of this month and we will reevaluate  then.   I discussed the assessment and treatment plan with the patient. The patient was provided an opportunity to ask questions and all were answered. The patient agreed with the plan and demonstrated an understanding of the instructions.   The patient was advised to call back or seek an in-person evaluation if the symptoms worsen or if the condition fails to improve as anticipated.   [name], NP

## 2018-12-17 ENCOUNTER — Encounter: Payer: BLUE CROSS/BLUE SHIELD | Admitting: Adult Health

## 2018-12-19 ENCOUNTER — Encounter: Payer: Self-pay | Admitting: Adult Health

## 2018-12-19 ENCOUNTER — Other Ambulatory Visit: Payer: Self-pay

## 2018-12-19 ENCOUNTER — Ambulatory Visit (INDEPENDENT_AMBULATORY_CARE_PROVIDER_SITE_OTHER): Payer: BLUE CROSS/BLUE SHIELD | Admitting: Adult Health

## 2018-12-19 VITALS — BP 164/108 | Temp 98.1°F | Ht 63.5 in | Wt 238.0 lb

## 2018-12-19 DIAGNOSIS — R600 Localized edema: Secondary | ICD-10-CM | POA: Diagnosis not present

## 2018-12-19 DIAGNOSIS — Z0000 Encounter for general adult medical examination without abnormal findings: Secondary | ICD-10-CM

## 2018-12-19 DIAGNOSIS — I10 Essential (primary) hypertension: Secondary | ICD-10-CM | POA: Diagnosis not present

## 2018-12-19 LAB — POC URINALSYSI DIPSTICK (AUTOMATED)
Bilirubin, UA: NEGATIVE
Blood, UA: NEGATIVE
Glucose, UA: NEGATIVE
Ketones, UA: NEGATIVE
Leukocytes, UA: NEGATIVE
Nitrite, UA: NEGATIVE
Protein, UA: NEGATIVE
Spec Grav, UA: 1.015
Urobilinogen, UA: 0.2 U/dL
pH, UA: 7

## 2018-12-19 LAB — CBC WITH DIFFERENTIAL/PLATELET
Basophils Absolute: 0 K/uL (ref 0.0–0.1)
Basophils Relative: 0.7 % (ref 0.0–3.0)
Eosinophils Absolute: 0.1 K/uL (ref 0.0–0.7)
Eosinophils Relative: 1.8 % (ref 0.0–5.0)
HCT: 36 % (ref 36.0–46.0)
Hemoglobin: 12.2 g/dL (ref 12.0–15.0)
Lymphocytes Relative: 32.8 % (ref 12.0–46.0)
Lymphs Abs: 1.1 K/uL (ref 0.7–4.0)
MCHC: 34 g/dL (ref 30.0–36.0)
MCV: 85.1 fl (ref 78.0–100.0)
Monocytes Absolute: 0.2 K/uL (ref 0.1–1.0)
Monocytes Relative: 6.5 % (ref 3.0–12.0)
Neutro Abs: 1.9 K/uL (ref 1.4–7.7)
Neutrophils Relative %: 58.2 % (ref 43.0–77.0)
Platelets: 305 K/uL (ref 150.0–400.0)
RBC: 4.23 Mil/uL (ref 3.87–5.11)
RDW: 14.6 % (ref 11.5–15.5)
WBC: 3.3 K/uL — ABNORMAL LOW (ref 4.0–10.5)

## 2018-12-19 LAB — COMPREHENSIVE METABOLIC PANEL
ALT: 24 U/L (ref 0–35)
AST: 21 U/L (ref 0–37)
Albumin: 4.1 g/dL (ref 3.5–5.2)
Alkaline Phosphatase: 91 U/L (ref 39–117)
BUN: 14 mg/dL (ref 6–23)
CO2: 31 mEq/L (ref 19–32)
Calcium: 9.4 mg/dL (ref 8.4–10.5)
Chloride: 100 mEq/L (ref 96–112)
Creatinine, Ser: 0.96 mg/dL (ref 0.40–1.20)
GFR: 75.11 mL/min (ref >60.00)
Glucose, Bld: 78 mg/dL (ref 70–99)
Potassium: 3.8 mEq/L (ref 3.5–5.1)
Sodium: 140 mEq/L (ref 135–145)
Total Bilirubin: 0.6 mg/dL (ref 0.2–1.2)
Total Protein: 7.9 g/dL (ref 6.0–8.3)

## 2018-12-19 LAB — LIPID PANEL
Cholesterol: 206 mg/dL — ABNORMAL HIGH (ref 0–200)
HDL: 70.2 mg/dL (ref >39.00)
LDL Cholesterol: 122 mg/dL — ABNORMAL HIGH (ref 0–99)
NonHDL: 136.27
Total CHOL/HDL Ratio: 3
Triglycerides: 70 mg/dL (ref 0.0–149.0)
VLDL: 14 mg/dL (ref 0.0–40.0)

## 2018-12-19 LAB — TSH: TSH: 1.89 u[IU]/mL (ref 0.35–4.50)

## 2018-12-19 MED ORDER — FUROSEMIDE 20 MG PO TABS: 20.0000 mg | ORAL_TABLET | ORAL | 0 refills | Status: DC

## 2018-12-19 NOTE — Progress Notes (Signed)
Subjective:    Patient ID: Janet Sanchez, female    DOB: 1970/11/25, 48 y.o.   MRN: [id number]  HPI  Patient presents for yearly preventative medicine examination. She is a pleasant 48 year old female who  has a past medical history of Enlarged tonsils, Herpes, Hypertension, Migraines, Morbid obesity ([place]), and Vasovagal syncope (10/24/2017).  Essential Hypertension - Currently prescribed Norvasc 10 mg.  The past she has been on multiple blood pressure medications and had side effects that caused her to stop taking these medications. She has been monitoring her BP at home and reports recent readings in the 150-160's/90-100's. She does report that she has cut back on fried foods and fast foods since our last visit.   1. Lisinopril - headache  2. Cozaar - Palpitations 3. Atenolol - Foggy feeling  4. HCTZ - renal impairment   BP Readings from Last 3 Encounters:  12/19/18 (!) 164/108  10/01/18 (!) 150/94  09/26/18 130/82   HSV - is prescribed Acyclovir 400 mg BID. No recent flares.   All immunizations and health maintenance protocols were reviewed with the patient and needed orders were placed. UTD  Appropriate screening laboratory values were ordered for the patient including screening of hyperlipidemia, renal function and hepatic function  Medication reconciliation,  past medical history, social history, problem list and allergies were reviewed in detail with the patient  Goals were established with regard to weight loss, exercise, and  diet in compliance with medications. She walks for an hour on Saturdays but does not do any formal exercise during the rest of the week  Wt Readings from Last 10 Encounters:  12/19/18 238 lb (108 kg)  10/01/18 232 lb (105.2 kg)  09/26/18 235 lb (106.6 kg)  08/20/18 237 lb (107.5 kg)  07/26/18 237 lb (107.5 kg)  04/02/18 237 lb (107.5 kg)  02/20/18 237 lb (107.5 kg)  01/30/18 238 lb (108 kg)  01/08/18 234 lb (106.1 kg)  01/01/18 235 lb  (106.6 kg)   Review of Systems  Constitutional: Negative.   HENT: Negative.   Eyes: Negative.   Respiratory: Negative.   Cardiovascular: Negative.   Gastrointestinal: Negative.   Endocrine: Negative.   Genitourinary: Negative.   Musculoskeletal: Positive for arthralgias.  Skin: Negative.   Allergic/Immunologic: Negative.   Neurological: Negative.   Hematological: Negative.   Psychiatric/Behavioral: Negative.   All other systems reviewed and are negative.  Past Medical History:  Diagnosis Date  . Enlarged tonsils   . Herpes   . Hypertension   . Migraines   . Morbid obesity ([place])   . Vasovagal syncope 10/24/2017    Social History   Socioeconomic History  . Marital status: Married    Spouse name: Not on file  . Number of children: Not on file  . Years of education: Not on file  . Highest education level: Not on file  Occupational History  . Not on file  Social Needs  . Financial resource strain: Not on file  . Food insecurity:    Worry: Not on file    Inability: Not on file  . Transportation needs:    Medical: Not on file    Non-medical: Not on file  Tobacco Use  . Smoking status: Never Smoker  . Smokeless tobacco: Never Used  Substance and Sexual Activity  . Alcohol use: No  . Drug use: No  . Sexual activity: Yes    Birth control/protection: Surgical  Lifestyle  . Physical activity:    Days per  week: Not on file    Minutes per session: Not on file  . Stress: Not on file  Relationships  . Social connections:    Talks on phone: Not on file    Gets together: Not on file    Attends religious service: Not on file    Active member of club or organization: Not on file    Attends meetings of clubs or organizations: Not on file    Relationship status: Not on file  . Intimate partner violence:    Fear of current or ex partner: Not on file    Emotionally abused: Not on file    Physically abused: Not on file    Forced sexual activity: Not on file  Other Topics  Concern  . 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 TUBAL LIGATION      Family History  Problem Relation Age of Onset  . Heart attack Mother 61  . Hypertension Father   . Heart attack Father 50  . Hypertension Sister   . Hypertension Brother   . Arrhythmia Brother   . Hypertension Sister     Allergies  Allergen Reactions  . Lisinopril Anaphylaxis    Headache   . Potassium-Containing Compounds Other (See Comments)    Severe cramps caused her to pass out.  Marland Kitchen Hctz [Hydrochlorothiazide] Other (See Comments)    Renal impairment     Current Outpatient Medications on File Prior to Visit  Medication Sig Dispense Refill  . acyclovir (ZOVIRAX) 400 MG tablet Take 1 tablet (400 mg total) by mouth 2 (two) times daily. 180 tablet 1  . albuterol (PROVENTIL HFA;VENTOLIN HFA) 108 (90 Base) MCG/ACT inhaler Inhale 2 puffs into the lungs every 6 (six) hours as needed for wheezing or shortness of breath. 1 Inhaler 2  . amLODipine (NORVASC) 10 MG tablet TAKE 1 TABLET BY MOUTH EVERY DAY 90 tablet 0  . aspirin EC 81 MG tablet Take 81 mg by mouth daily.    Marland Kitchen ibuprofen (ADVIL,MOTRIN) 200 MG tablet Take 800 mg by mouth 2 (two) times daily as needed for headache. Per neurologist: limit to 2 days a week to avoid rebound headaches.     No current facility-administered medications on file prior to visit.     BP (!) 164/108   Temp 98.1 F (36.7 C)   Ht 5' 3.5" (1.613 m)   Wt 238 lb (108 kg)   BMI 41.50 kg/m       Objective:   Physical Exam Vitals signs and nursing note reviewed.  Constitutional:      General: She is not in acute distress.    Appearance: Normal appearance. She is well-developed. She is obese.  HENT:     Head: Normocephalic and atraumatic.     Right Ear: Tympanic membrane, ear canal and external ear normal. There is no  impacted cerumen.     Left Ear: Tympanic membrane, ear canal and external ear normal. There is no impacted cerumen.     Nose: Nose normal. No congestion or rhinorrhea.     Mouth/Throat:     Mouth: Mucous membranes are moist.     Pharynx: Oropharynx is clear. No oropharyngeal exudate or posterior oropharyngeal erythema.  Eyes:     General:        Right  eye: No discharge.        Left eye: No discharge.     Conjunctiva/sclera: Conjunctivae normal.  Neck:     Musculoskeletal: Normal range of motion and neck supple. No neck rigidity or muscular tenderness.     Thyroid: No thyromegaly.     Vascular: No carotid bruit.     Trachea: No tracheal deviation.  Cardiovascular:     Rate and Rhythm: Normal rate and regular rhythm.     Heart sounds: Normal heart sounds. No murmur. No friction rub. No gallop.   Pulmonary:     Effort: Pulmonary effort is normal. No respiratory distress.     Breath sounds: Normal breath sounds. No stridor. No wheezing, rhonchi or rales.  Chest:     Chest wall: No tenderness.  Abdominal:     General: Bowel sounds are normal. There is no distension.     Palpations: Abdomen is soft. There is no mass.     Tenderness: There is no abdominal tenderness. There is no right CVA tenderness, left CVA tenderness, guarding or rebound.     Hernia: No hernia is present.  Musculoskeletal: Normal range of motion.        General: No swelling, tenderness, deformity or signs of injury.     Right lower leg: Edema (+ 1 pitting edema) present.     Left lower leg: Edema (+ 1 pitting edema) present.  Lymphadenopathy:     Cervical: No cervical adenopathy.  Skin:    General: Skin is warm and dry.     Capillary Refill: Capillary refill takes less than 2 seconds.     Coloration: Skin is not jaundiced or pale.     Findings: No bruising, erythema, lesion or rash.  Neurological:     General: No focal deficit present.     Mental Status: She is alert and oriented to person, place, and time.      Cranial Nerves: No cranial nerve deficit.     Coordination: Coordination normal.  Psychiatric:        Mood and Affect: Mood normal.        Behavior: Behavior normal.        Thought Content: Thought content normal.        Judgment: Judgment normal.       Assessment & Plan:  1. Routine general medical examination at a health care facility  - CBC with Differential/Platelet - Comprehensive metabolic panel - Lipid panel - TSH - POCT Urinalysis Dipstick (Automated)  2. Essential hypertension - Not at goal. Will add Lasix.  - She needs to work on weight loss and heart healthy diet  - one week follow up  - CBC with Differential/Platelet - Comprehensive metabolic panel - Lipid panel - TSH - furosemide (LASIX) 20 MG tablet; Take 1 tablet (20 mg total) by mouth every other day for 30 days.  Dispense: 15 tablet; Refill: 0 - POCT Urinalysis Dipstick (Automated)  3. Morbid obesity ([place])  - CBC with Differential/Platelet - Comprehensive metabolic panel - Lipid panel - TSH  4. Lower extremity edema  - furosemide (LASIX) 20 MG tablet; Take 1 tablet (20 mg total) by mouth every other day for 30 days.  Dispense: 15 tablet; Refill: 0 - One week follow up   [name], NP

## 2018-12-19 NOTE — Patient Instructions (Signed)
It was great seeing you today   I will follow up with you regarding your labs   I have prescribed Lasix to help with blood pressure and the swelling in the lower extremities   Please follow up in 7-10 days

## 2018-12-26 ENCOUNTER — Encounter: Payer: Self-pay | Admitting: Adult Health

## 2018-12-26 ENCOUNTER — Telehealth: Payer: Self-pay | Admitting: Neurology

## 2018-12-26 ENCOUNTER — Other Ambulatory Visit: Payer: Self-pay

## 2018-12-26 ENCOUNTER — Ambulatory Visit (INDEPENDENT_AMBULATORY_CARE_PROVIDER_SITE_OTHER): Payer: BLUE CROSS/BLUE SHIELD | Admitting: Adult Health

## 2018-12-26 VITALS — BP 154/90 | Temp 98.1°F | Wt 239.0 lb

## 2018-12-26 DIAGNOSIS — I10 Essential (primary) hypertension: Secondary | ICD-10-CM

## 2018-12-26 DIAGNOSIS — G5603 Carpal tunnel syndrome, bilateral upper limbs: Secondary | ICD-10-CM

## 2018-12-26 MED ORDER — HYDRALAZINE HCL 10 MG PO TABS: 10.0000 mg | ORAL_TABLET | Freq: Three times a day (TID) | ORAL | 0 refills | Status: DC

## 2018-12-26 NOTE — Progress Notes (Addendum)
Subjective:    Patient ID: Janet Sanchez, female    DOB: Jan 13, 1971, 48 y.o.   MRN: [id number]  HPI 48 year old female who  has a past medical history of Enlarged tonsils, Herpes, Hypertension, Migraines, Morbid obesity ([place]), and Vasovagal syncope (10/24/2017).  48 year old female who is being evaluated today in the office for 2-week follow-up regarding hypertension.  She has been on multiple blood pressure medications in the past that eventually had to be stopped due to side effects.  1. lisinopril caused a headache 2. Cozaar caused palpitations 3.  Atenolol caused a "foggy feeling" 4 .HCTZ caused renal impairment  He has been on Norvasc for some time now and feels as though this is causing lower extremity edema, her blood pressure was not at goal during her physical 2 weeks ago and Lasix 20 mg was added.  At home she has not noticed any change in her blood pressure and she is refusing to take Norvasc any longer.  She does report that she has been drinking a lot more water, exercising more, eating healthier, and cutting back on foods high in sodium.  Patient complains of worsening carpal tunnel pain bilaterally.  She reports that she has been using wrist braces but symptoms are not improving.  Has numbness and tingling throughout her fingers worse in the thumb and index finger bilaterally.  Feels as though she has some decreased grip strength at times.   Review of Systems See HPI   Past Medical History:  Diagnosis Date  . Enlarged tonsils   . Herpes   . Hypertension   . Migraines   . Morbid obesity ([place])   . Vasovagal syncope 10/24/2017    Social History   Socioeconomic History  . Marital status: Married    Spouse name: Not on file  . Number of children: Not on file  . Years of education: Not on file  . Highest education level: Not on file  Occupational History  . Not on file  Social Needs  . Financial resource strain: Not on file  . Food insecurity:    Worry:  Not on file    Inability: Not on file  . Transportation needs:    Medical: Not on file    Non-medical: Not on file  Tobacco Use  . Smoking status: Never Smoker  . Smokeless tobacco: Never Used  Substance and Sexual Activity  . Alcohol use: No  . Drug use: No  . Sexual activity: Yes    Birth control/protection: Surgical  Lifestyle  . Physical activity:    Days per week: Not on file    Minutes per session: Not on file  . Stress: Not on file  Relationships  . Social connections:    Talks on phone: Not on file    Gets together: Not on file    Attends religious service: Not on file    Active member of club or organization: Not on file    Attends meetings of clubs or organizations: Not on file    Relationship status: Not on file  . Intimate partner violence:    Fear of current or ex partner: Not on file    Emotionally abused: Not on file    Physically abused: Not on file    Forced sexual activity: Not on file  Other Topics Concern  . 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 TUBAL LIGATION      Family History  Problem Relation Age of Onset  . Heart attack Mother 14  . Hypertension Father   . Heart attack Father 25  . Hypertension Sister   . Hypertension Brother   . Arrhythmia Brother   . Hypertension Sister     Allergies  Allergen Reactions  . Lisinopril Anaphylaxis    Headache   . Potassium-Containing Compounds Other (See Comments)    Severe cramps caused her to pass out.  Marland Kitchen Hctz [Hydrochlorothiazide] Other (See Comments)    Renal impairment     Current Outpatient Medications on File Prior to Visit  Medication Sig Dispense Refill  . acyclovir (ZOVIRAX) 400 MG tablet Take 1 tablet (400 mg total) by mouth 2 (two) times daily. 180 tablet 1  . albuterol (PROVENTIL HFA;VENTOLIN HFA) 108 (90 Base) MCG/ACT inhaler  Inhale 2 puffs into the lungs every 6 (six) hours as needed for wheezing or shortness of breath. 1 Inhaler 2  . aspirin EC 81 MG tablet Take 81 mg by mouth daily.    Marland Kitchen ibuprofen (ADVIL,MOTRIN) 200 MG tablet Take 800 mg by mouth 2 (two) times daily as needed for headache. Per neurologist: limit to 2 days a week to avoid rebound headaches.     No current facility-administered medications on file prior to visit.     BP (!) 154/90   Temp 98.1 F (36.7 C)   Wt 239 lb (108.4 kg)   BMI 41.67 kg/m       Objective:   Physical Exam Vitals signs and nursing note reviewed.  Constitutional:      Appearance: Normal appearance.  Cardiovascular:     Rate and Rhythm: Normal rate and regular rhythm.     Pulses: Normal pulses.     Heart sounds: Normal heart sounds.  Pulmonary:     Effort: Pulmonary effort is normal.     Breath sounds: Normal breath sounds.  Musculoskeletal:     Right lower leg: Edema present.     Left lower leg: Edema present.  Skin:    General: Skin is warm and dry.  Neurological:     Mental Status: She is alert.     Comments: Positive Tinel and Phalen's test.  No decreased grip strength bilaterally  Psychiatric:        Mood and Affect: Mood normal.        Behavior: Behavior normal.        Thought Content: Thought content normal.        Judgment: Judgment normal.       Assessment & Plan:  1. Essential hypertension -DC Norvasc and Lasix.  Placed on hydralazine.  Advised follow-up in 2 weeks.  Return precautions reviewed - hydrALAZINE (APRESOLINE) 10 MG tablet; Take 1 tablet (10 mg total) by mouth 3 (three) times daily for 30 days.  Dispense: 90 tablet; Refill: 0  [name], NP

## 2018-12-26 NOTE — Telephone Encounter (Signed)
Patient called regarding her June 9th appointment that she cancelled. Can this be an E visit or In office Visit? Please Advise. Thanks

## 2018-12-31 ENCOUNTER — Ambulatory Visit: Payer: BLUE CROSS/BLUE SHIELD | Admitting: Neurology

## 2018-12-31 NOTE — Addendum Note (Signed)
Addended by: Apolinar Junes on: 12/31/2018 08:05 AM   Modules accepted: Orders

## 2019-01-06 ENCOUNTER — Telehealth: Payer: Self-pay | Admitting: Adult Health

## 2019-01-06 DIAGNOSIS — I10 Essential (primary) hypertension: Secondary | ICD-10-CM

## 2019-01-06 NOTE — Telephone Encounter (Signed)
Copied from [place] [id number] [id number]. Topic: Quick Communication - See Telephone Encounter >> Jan 06, 2019  8:34 AM Robina Ade, Helene Kelp D wrote: CRM for notification. See Telephone encounter for: 01/06/19. Patient called and would like to talk to [facility] or his CMA about her BP medication not working. Her BP today was 179/105 and is feeling ok, she just wants to know why her med isn't working.

## 2019-01-07 MED ORDER — HYDRALAZINE HCL 10 MG PO TABS: 25.0000 mg | ORAL_TABLET | Freq: Three times a day (TID) | ORAL | 0 refills | Status: DC

## 2019-01-07 NOTE — Telephone Encounter (Signed)
LMOM for a return call.  

## 2019-01-07 NOTE — Telephone Encounter (Signed)
Spoke to the pt and informed her to increase hydralazine to 2.5 tabs tid.  New rx sent to the pharmacy.  Pt will call back if needed.

## 2019-01-07 NOTE — Telephone Encounter (Signed)
It probably isn't a strong enough dose. Lets increase Hydralazine to 25 mg TID x 30 days

## 2019-01-13 ENCOUNTER — Ambulatory Visit: Payer: Self-pay | Admitting: *Deleted

## 2019-01-13 ENCOUNTER — Telehealth: Payer: Self-pay | Admitting: Adult Health

## 2019-01-13 NOTE — Telephone Encounter (Signed)
Appt scheduled

## 2019-01-13 NOTE — Telephone Encounter (Signed)
Pt called with complaints that her BP is elevated 185/110 01/13/2019 at 1200; her other reading are 170s/95; she says that she has been taking the medication as ordered on 01/06/2019; she is using an automatic cuff on her left arm; the pt says that she is afraid to eat because she is afraid that eatnig will make her blood pressure higher; recommendations made per nurse triage protocol; she verbalized understanding; the pt sees [facility], [facility]; pt transferred to [facility] for scheduling.   Reason for Disposition . Systolic BP  >= 553 OR Diastolic >= 748  Answer Assessment - Initial Assessment Questions 1. BLOOD PRESSURE: "What is the blood pressure?" "Did you take at least two measurements 5 minutes apart?"     185/110 at 1200 01/13/2019 and 170s/90s on 01/12/2019 2. ONSET: "When did you take your blood pressure?"    01/13/2019 at 1200; 01/12/2019 at 0700 3. HOW: "How did you obtain the blood pressure?" (e.g., visiting nurse, automatic home BP monitor)    Automatic BP cuff on left arm 4. HISTORY: "Do you have a history of high blood pressure?"     yes 5. MEDICATIONS: "Are you taking any medications for blood pressure?" "Have you missed any doses recently?"     no 6. OTHER SYMPTOMS: "Do you have any symptoms?" (e.g., headache, chest pain, blurred vision, difficulty breathing, weakness)     Headache; intermittent blurred vision due to the need for new glasses 7. PREGNANCY: "Is there any chance you are pregnant?" "When was your last menstrual period?"     No BTL; LMP 01/11/2019  Protocols used: HIGH BLOOD PRESSURE-A-AH

## 2019-01-13 NOTE — Telephone Encounter (Signed)
Pt prefer to come in the office, but she is scheduled for a doxy.me tomorrow at 9:30 a.m.Marland Kitchen  Pt would like to have a call back to discuss coming in the office.

## 2019-01-14 ENCOUNTER — Encounter: Payer: Self-pay | Admitting: Adult Health

## 2019-01-14 ENCOUNTER — Other Ambulatory Visit: Payer: Self-pay

## 2019-01-14 ENCOUNTER — Ambulatory Visit (INDEPENDENT_AMBULATORY_CARE_PROVIDER_SITE_OTHER): Payer: BLUE CROSS/BLUE SHIELD | Admitting: Adult Health

## 2019-01-14 DIAGNOSIS — I10 Essential (primary) hypertension: Secondary | ICD-10-CM | POA: Diagnosis not present

## 2019-01-14 MED ORDER — HYDRALAZINE HCL 50 MG PO TABS: 50.0000 mg | ORAL_TABLET | Freq: Three times a day (TID) | ORAL | 0 refills | Status: DC

## 2019-01-14 NOTE — Progress Notes (Addendum)
Virtual Visit via Video Note  I connected with Janet Sanchez on 01/14/19 at  9:30 AM EDT by a video enabled telemedicine application and verified that I am speaking with the correct person using two identifiers.  Location patient: home Location provider:work or home office Persons participating in the virtual visit: patient, provider  I discussed the limitations of evaluation and management by telemedicine and the availability of in person appointments. The patient expressed understanding and agreed to proceed.   HPI: 48 year old female being evaluated today regarding hypertension.  She has been on multiple blood pressure medications in the past that eventually needed to be stopped due to side effects.  Recently approximately 2 weeks ago's Norvasc and Lasix was decreased due to lower extremity edema.  She was placed on hydralazine 10 mg 3 times daily and advised to follow-up in 2 weeks.  In that time she sent a MyChart message stating that her blood pressure continue to be elevated so as he was increased to 25 mg 3 times daily.  They she reports that her blood pressure continues to be varying between 150s to 180s over 90s to low 100s.  Nuys dizziness, lightheadedness, headaches, or blurred vision.   ROS: See pertinent positives and negatives per HPI.  Past Medical History:  Diagnosis Date  . Enlarged tonsils   . Herpes   . Hypertension   . Migraines   . Morbid obesity ([place])   . Vasovagal syncope 10/24/2017    Past Surgical History:  Procedure Laterality Date  . TUBAL LIGATION      Family History  Problem Relation Age of Onset  . Heart attack Mother 40  . Hypertension Father   . Heart attack Father 43  . Hypertension Sister   . Hypertension Brother   . Arrhythmia Brother   . Hypertension Sister      Current Outpatient Medications:  .  acyclovir (ZOVIRAX) 400 MG tablet, Take 1 tablet (400 mg total) by mouth 2 (two) times daily., Disp: 180 tablet, Rfl: 1 .  albuterol  (PROVENTIL HFA;VENTOLIN HFA) 108 (90 Base) MCG/ACT inhaler, Inhale 2 puffs into the lungs every 6 (six) hours as needed for wheezing or shortness of breath., Disp: 1 Inhaler, Rfl: 2 .  aspirin EC 81 MG tablet, Take 81 mg by mouth daily., Disp: , Rfl:  .  hydrALAZINE (APRESOLINE) 50 MG tablet, Take 1 tablet (50 mg total) by mouth 3 (three) times daily for 30 days., Disp: 90 tablet, Rfl: 0 .  ibuprofen (ADVIL,MOTRIN) 200 MG tablet, Take 800 mg by mouth 2 (two) times daily as needed for headache. Per neurologist: limit to 2 days a week to avoid rebound headaches., Disp: , Rfl:   EXAM:  VITALS per patient if applicable:  GENERAL: alert, oriented, appears well and in no acute distress  HEENT: atraumatic, conjunttiva clear, no obvious abnormalities on inspection of external nose and ears  NECK: normal movements of the head and neck  LUNGS: on inspection no signs of respiratory distress, breathing rate appears normal, no obvious gross SOB, gasping or wheezing  CV: no obvious cyanosis  MS: moves all visible extremities without noticeable abnormality  PSYCH/NEURO: pleasant and cooperative, no obvious depression or anxiety, speech and thought processing grossly intact  ASSESSMENT AND PLAN:  Discussed the following assessment and plan:  1. Essential hypertension  - hydrALAZINE (APRESOLINE) 50 MG tablet; Take 1 tablet (50 mg total) by mouth 3 (three) times daily for 30 days.  Dispense: 90 tablet; Refill: 0 -We will increase hydralazine  from 25 mg to 50 mg 3 times daily.  Follow-up in 10 days    I discussed the assessment and treatment plan with the patient. The patient was provided an opportunity to ask questions and all were answered. The patient agreed with the plan and demonstrated an understanding of the instructions.   The patient was advised to call back or seek an in-person evaluation if the symptoms worsen or if the condition fails to improve as anticipated.   [name], NP

## 2019-01-14 NOTE — Telephone Encounter (Signed)
Pt currently in doxy visit with [facility].  Nothing further needed.  Will close encounter.

## 2019-01-20 ENCOUNTER — Telehealth: Payer: Self-pay

## 2019-01-20 DIAGNOSIS — I10 Essential (primary) hypertension: Secondary | ICD-10-CM

## 2019-01-20 NOTE — Telephone Encounter (Signed)
Copied from [place] [id number]. Topic: General - Other >> Jan 17, 2019  4:57 PM Jeri Cos wrote: Reason for CRM: Current blood pressure medication is not working. Pt wants to go back to previous prescription and will deal with the swelling. Pt would like doctor or nurse to call her in reference to her request.

## 2019-01-21 ENCOUNTER — Ambulatory Visit: Payer: Self-pay | Admitting: *Deleted

## 2019-01-21 MED ORDER — LISINOPRIL 40 MG PO TABS: 40.0000 mg | ORAL_TABLET | Freq: Every day | ORAL | 3 refills | Status: DC

## 2019-01-21 NOTE — Telephone Encounter (Signed)
Spoke to patient about her blood pressure. She is currently prescribed Hydralazine 50 mg TID. She reports that her blood pressure continues to be elevated in the 150's/90's. She is having headaches.   She would like to go back on lisinopril as she had the best results with this medication despite having intermittent headaches.   I am also going to check a renal ultrasound

## 2019-01-21 NOTE — Telephone Encounter (Signed)
I returned pt's call.   She called this morning pertaining to this same issue with her BP being elevated.   Just waiting for [name], NP to respond to her note from earlier.  I'll send this note also to the office. I sent this note to [name], NP for further disposition.    Reason for Disposition . Systolic BP  >= 945 OR Diastolic >= 859  Answer Assessment - Initial Assessment Questions 1. BLOOD PRESSURE: "What is the blood pressure?" "Did you take at least two 166/105.   179/111 at lunch today.    I waited 15 min I took one of my son's amlodipine pills to get my BP down.     173/113 was reading I got after that.asurements 5 minutes apart?"     Yes 2. ONSET: "When did you take your blood pressure?"     At lunch time today. 3. HOW: "How did you obtain the blood pressure?" (e.g., visiting nurse, automatic home BP monitor)     I went home and took it at lunch. 4. HISTORY: "Do you have a history of high blood pressure?"     Yes 5. MEDICATIONS: "Are you taking any medications for blood pressure?" "Have you missed any doses recently?"     Yes 6. OTHER SYMPTOMS: "Do you have any symptoms?" (e.g., headache, chest pain, blurred vision, difficulty breathing, weakness)     I get hot all over when I BP is high. 7. PREGNANCY: "Is there any chance you are pregnant?" "When was your last menstrual period?"     Not asked  Protocols used: HIGH BLOOD PRESSURE-A-AH

## 2019-02-06 ENCOUNTER — Telehealth: Payer: Self-pay | Admitting: Adult Health

## 2019-02-06 NOTE — Telephone Encounter (Signed)
Patient is calling to request if she can go back on hydrALAZINE (APRESOLINE) 10 MG tablet [[id number]] DISCONTINUED  She feels that the Lisinopril current medication is not working. Her BP has been ranging from 150-170.  The patient is expresses maybe she needs a BP medication with a diatoric.  Patient is requesting a nutritionist as well.   Patient is also request testing to see if her kidneys are functioning properly.  Cb- [phone]

## 2019-02-07 ENCOUNTER — Other Ambulatory Visit: Payer: Self-pay | Admitting: Adult Health

## 2019-02-07 DIAGNOSIS — I10 Essential (primary) hypertension: Secondary | ICD-10-CM

## 2019-02-07 MED ORDER — CLONIDINE HCL 0.1 MG PO TABS: 0.1000 mg | ORAL_TABLET | Freq: Two times a day (BID) | ORAL | 0 refills | Status: DC

## 2019-02-07 MED ORDER — HYDRALAZINE HCL 50 MG PO TABS: 50.0000 mg | ORAL_TABLET | Freq: Three times a day (TID) | ORAL | 0 refills | Status: DC

## 2019-02-07 NOTE — Telephone Encounter (Signed)
Spoke to the pt and notified her of below message from [place].  She will call insurance and if a nutritionist is covered then will call back for referral.  She will pick up both rx from the pharmacy.

## 2019-02-07 NOTE — Telephone Encounter (Signed)
D/C lisinopril   Have sent prescription to pharmacy for Hydralazine. I have also added a medication called Clonidine.   Her kidneys are working fine- we checked recently.   Have her contact her insurance company first to see if they cover a nutritionist

## 2019-02-11 ENCOUNTER — Telehealth: Payer: Self-pay | Admitting: Family Medicine

## 2019-02-11 NOTE — Telephone Encounter (Signed)
Left a message for a return call.  Pt needs virtual appt with Tommi Rumps.

## 2019-02-11 NOTE — Telephone Encounter (Signed)
Copied from [place] [id number]. Topic: General - Other >> Feb 11, 2019 12:40 PM Leward Quan A wrote: Reason for CRM: Patient called to ask Dr Tommi Rumps to put in some orders for her to be tested for the coronavirus due to being exposed. She also stated that her hydrALAZINE (APRESOLINE) 50 MG tablet is causing the tension headache to increase. She want to be put on a diuretic for the BP that does not cause any side effects. Patient is requesting a call back please at Ph# [phone]

## 2019-02-12 NOTE — Telephone Encounter (Signed)
Cory has spoke to the pt.  Nothing further needed. 

## 2019-02-13 ENCOUNTER — Encounter: Payer: Self-pay | Admitting: Family Medicine

## 2019-02-13 ENCOUNTER — Ambulatory Visit (INDEPENDENT_AMBULATORY_CARE_PROVIDER_SITE_OTHER): Payer: BLUE CROSS/BLUE SHIELD | Admitting: Family Medicine

## 2019-02-13 ENCOUNTER — Other Ambulatory Visit: Payer: Self-pay

## 2019-02-13 DIAGNOSIS — G5603 Carpal tunnel syndrome, bilateral upper limbs: Secondary | ICD-10-CM

## 2019-02-13 MED ORDER — GABAPENTIN 100 MG PO CAPS: 200.0000 mg | ORAL_CAPSULE | Freq: Every day | ORAL | 3 refills | Status: AC

## 2019-02-13 NOTE — Progress Notes (Signed)
[facility] [address], [place] [address] Phone: [phone] Subjective:   I Janet Sanchez am serving as a Education administrator for Dr. Hulan Saas.  I'm seeing this patient by the request  of:  [name], NP   CC: bilateral wrist pain   [id number] rr Janet Sanchez is a 48 y.o. female coming in with complaint of bilateral wrist pain. Carpal tunnel. Wears a brace on one hand but has to switch depending on the pain.  Patient states every morning wakes up with more of a numbness in the hands.  Patient rates the severity of pain sometimes is 9 out of 10.  Affecting daily activities such as even dressing.  Patient states everything now seems to be intermittent.  Can have times where she does not have pain.  Has been attempting an over-the-counter brace intermittently.      Past Medical History:  Diagnosis Date  . Enlarged tonsils   . Herpes   . Hypertension   . Migraines   . Morbid obesity ([place])   . Vasovagal syncope 10/24/2017   Past Surgical History:  Procedure Laterality Date  . TUBAL LIGATION     Social History   Socioeconomic History  . Marital status: Married    Spouse name: Not on file  . Number of children: Not on file  . Years of education: Not on file  . Highest education level: Not on file  Occupational History  . Not on file  Social Needs  . Financial resource strain: Not on file  . Food insecurity    Worry: Not on file    Inability: Not on file  . Transportation needs    Medical: Not on file    Non-medical: Not on file  Tobacco Use  . Smoking status: Never Smoker  . Smokeless tobacco: Never Used  Substance and Sexual Activity  . Alcohol use: No  . Drug use: No  . Sexual activity: Yes    Birth control/protection: Surgical  Lifestyle  . Physical activity    Days per week: Not on file    Minutes per session: Not on file  . Stress: Not on file  Relationships  . Social Herbalist on phone: Not on  file    Gets together: Not on file    Attends religious service: Not on file    Active member of club or organization: Not on file    Attends meetings of clubs or organizations: Not on file    Relationship status: Not on file  Other Topics Concern  . 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 Potassium-Containing Compounds Other (See Comments)    Severe cramps caused her to pass out.  Marland Kitchen Hctz [Hydrochlorothiazide] Other (See Comments)    Renal impairment   . Lisinopril Other (See Comments)    Headache    Family History  Problem Relation Age of Onset  . Heart attack Mother 61  . Hypertension Father   . Heart attack Father 90  . Hypertension Sister   . Hypertension Brother   . Arrhythmia Brother   . Hypertension Sister      Current Outpatient Medications (Cardiovascular):  .  cloNIDine (CATAPRES) 0.1 MG tablet, Take 1 tablet (0.1 mg total) by mouth 2 (two) times daily. Marland Kitchen  hydrALAZINE (APRESOLINE) 50 MG tablet, Take 1 tablet (50 mg total) by mouth 3 (three) times daily.  Current Outpatient Medications (Respiratory):  .  albuterol (PROVENTIL HFA;VENTOLIN HFA) 108 (90 Base) MCG/ACT inhaler, Inhale 2 puffs into the lungs every 6 (six) hours as needed for wheezing or shortness of breath.  Current Outpatient Medications (Analgesics):  .  aspirin EC 81 MG tablet, Take 81 mg by mouth daily. Marland Kitchen  ibuprofen (ADVIL,MOTRIN) 200 MG tablet, Take 800 mg by mouth 2 (two) times daily as needed for headache. Per neurologist: limit to 2 days a week to avoid rebound headaches.   Current Outpatient Medications (Other):  .  acyclovir (ZOVIRAX) 400 MG tablet, Take 1 tablet (400 mg total) by mouth 2 (two) times daily. Marland Kitchen  gabapentin (NEURONTIN) 100 MG capsule, Take 2 capsules (200 mg total) by mouth at bedtime.    Past medical history, social, surgical and family history  all reviewed in electronic medical record.  No pertanent information unless stated regarding to the chief complaint.   Review of Systems:  No headache, visual changes, nausea, vomiting, diarrhea, constipation, dizziness, abdominal pain, skin rash, fevers, chills, night sweats, weight loss, swollen lymph nodes, body aches, joint swelling,  chest pain, shortness of breath, mood changes.  Positive muscle aches  Objective  Blood pressure (!) 150/100, pulse 70, height 5\' 3"  (1.6 m), weight 239 lb (108.4 kg), SpO2 98 %.    General: No apparent distress alert and oriented x3 mood and affect normal, dressed appropriately.  HEENT: Pupils equal, extraocular movements intact  Respiratory: Patient's speak in full sentences and does not appear short of breath  Cardiovascular: No lower extremity edema, non tender, no erythema  Skin: Warm dry intact with no signs of infection or rash on extremities or on axial skeleton.  Abdomen: Soft nontender  Neuro: Cranial nerves II through XII are intact, neurovascularly intact in all extremities with 2+ DTRs and 2+ pulses.  Lymph: No lymphadenopathy of posterior or anterior cervical chain or axillae bilaterally.  Gait normal with good balance and coordination.  MSK:  Non tender with full range of motion and good stability and symmetric strength and tone of shoulders, elbows,  hip, knee and ankles bilaterally.  Wrist: Bilateral Inspection trace swelling noted ROM smooth and normal with good flexion and extension and ulnar/radial deviation that is symmetrical with opposite wrist. Palpation is normal over metacarpals, navicular, lunate, and TFCC; tendons without tenderness/ swelling No snuffbox tenderness. No tenderness over Canal of [place]. Strength 5/5 in all directions without pain. Negative Finkelstein, mildly positive Tinel's and phalens. Negative Watson's test.  MSK US performed of: Bilateral wrist This study was ordered, performed, and interpreted by Charlann Boxer D.O.  Wrist: Limited ultrasound of patient's carpal tunnel area where median nerve actually is within normal circumference of 0.12 cm.  Mild hypoechoic changes within the tendon sheath.  IMPRESSION: Normal to mild carpal tunnel   Impression and Recommendations:     This case required medical decision making of moderate complexity. The above documentation has been reviewed and is accurate and complete Lyndal Pulley, DO       Note: This dictation was prepared with Dragon dictation along with smaller phrase technology. Any transcriptional errors that result from this process are unintentional.

## 2019-02-13 NOTE — Patient Instructions (Addendum)
Good to see you Exercises 3 time a week Ice 20 minutes 2 times daily. Usually after activity and before bed Gabapentin 200 mg at night  Turmeric 500mg  daily  Tart cherry extract 1200mg  at night Vitamin D 2000 IU daily  See me again in 4 weeks

## 2019-02-14 ENCOUNTER — Encounter: Payer: Self-pay | Admitting: Adult Health

## 2019-02-14 ENCOUNTER — Ambulatory Visit
Admission: RE | Admit: 2019-02-14 | Discharge: 2019-02-14 | Disposition: A | Discharge status: Home / Self Care | Payer: BLUE CROSS/BLUE SHIELD | Source: Ambulatory Visit | Attending: Adult Health | Admitting: Adult Health

## 2019-02-14 ENCOUNTER — Other Ambulatory Visit: Payer: Self-pay

## 2019-02-14 ENCOUNTER — Encounter: Payer: Self-pay | Admitting: Family Medicine

## 2019-02-14 ENCOUNTER — Ambulatory Visit (INDEPENDENT_AMBULATORY_CARE_PROVIDER_SITE_OTHER): Payer: BLUE CROSS/BLUE SHIELD | Admitting: Adult Health

## 2019-02-14 DIAGNOSIS — I10 Essential (primary) hypertension: Secondary | ICD-10-CM | POA: Diagnosis not present

## 2019-02-14 MED ORDER — FUROSEMIDE 20 MG PO TABS: 20.0000 mg | ORAL_TABLET | Freq: Every day | ORAL | 0 refills | Status: DC

## 2019-02-14 NOTE — Assessment & Plan Note (Signed)
Patient has had difficulty with wrist pain for quite some time.  Does have findings that are suggestive of carpal tunnel.  Home exercises given, bracing at night, topical anti-inflammatories and gabapentin given.  We discussed future considerations will include occupational therapy and potential injections.  Follow-up again in 4 weeks

## 2019-02-14 NOTE — Progress Notes (Signed)
Virtual Visit via Video Note  I connected with Wonda Horner on 02/14/19 at  1:30 PM EDT by a video enabled telemedicine application and verified that I am speaking with the correct person using two identifiers.  Location patient: home Location provider:work or home office Persons participating in the virtual visit: patient, provider  I discussed the limitations of evaluation and management by telemedicine and the availability of in person appointments. The patient expressed understanding and agreed to proceed.   HPI: 48 year old female who is being evaluated today for follow-up regarding hypertension.  Most recently she has been prescribed hydralazine 50 mg 3 times daily and clonidine 0.1 mg BID.  She reports that when she started taking both of these medications her blood pressure dropped into the 130s over 80s but then started going back up again into the 150s to 160s over 90s.  Clonidine also caused her to have a worsening headache.  Unfortunately she has failed multiple blood pressure medications in the past due to side effects   1. lisinopril caused a headache 2. Cozaar caused palpitations 3.  Atenolol caused a "foggy feeling" 4 .HCTZ caused renal impairment  She is going for renal ultrasound later on today   ROS: See pertinent positives and negatives per HPI.  Past Medical History:  Diagnosis Date  . Enlarged tonsils   . Herpes   . Hypertension   . Migraines   . Morbid obesity ([place])   . Vasovagal syncope 10/24/2017    Past Surgical History:  Procedure Laterality Date  . TUBAL LIGATION      Family History  Problem Relation Age of Onset  . Heart attack Mother 18  . Hypertension Father   . Heart attack Father 33  . Hypertension Sister   . Hypertension Brother   . Arrhythmia Brother   . Hypertension Sister      Current Outpatient Medications:  .  acyclovir (ZOVIRAX) 400 MG tablet, Take 1 tablet (400 mg total) by mouth 2 (two) times daily., Disp: 180 tablet, Rfl:  1 .  albuterol (PROVENTIL HFA;VENTOLIN HFA) 108 (90 Base) MCG/ACT inhaler, Inhale 2 puffs into the lungs every 6 (six) hours as needed for wheezing or shortness of breath., Disp: 1 Inhaler, Rfl: 2 .  aspirin EC 81 MG tablet, Take 81 mg by mouth daily., Disp: , Rfl:  .  gabapentin (NEURONTIN) 100 MG capsule, Take 2 capsules (200 mg total) by mouth at bedtime., Disp: 180 capsule, Rfl: 3 .  hydrALAZINE (APRESOLINE) 50 MG tablet, Take 1 tablet (50 mg total) by mouth 3 (three) times daily., Disp: 270 tablet, Rfl: 0 .  ibuprofen (ADVIL,MOTRIN) 200 MG tablet, Take 800 mg by mouth 2 (two) times daily as needed for headache. Per neurologist: limit to 2 days a week to avoid rebound headaches., Disp: , Rfl:   EXAM:  VITALS per patient if applicable:  GENERAL: alert, oriented, appears well and in no acute distress  HEENT: atraumatic, conjunttiva clear, no obvious abnormalities on inspection of external nose and ears  NECK: normal movements of the head and neck  LUNGS: on inspection no signs of respiratory distress, breathing rate appears normal, no obvious gross SOB, gasping or wheezing  CV: no obvious cyanosis  MS: moves all visible extremities without noticeable abnormality  PSYCH/NEURO: pleasant and cooperative, no obvious depression or anxiety, speech and thought processing grossly intact  ASSESSMENT AND PLAN:  Discussed the following assessment and plan:  1. Essential hypertension -DC clonidine.  Keep her on hydralazine 50 mg 3 times  daily and placed back on Lasix 20 mg daily.  I will have her follow-up next week for blood work as well as blood pressure check - furosemide (LASIX) 20 MG tablet; Take 1 tablet (20 mg total) by mouth daily.  Dispense: 30 tablet; Refill: 0 - Basic Metabolic Panel; Future - Likely need to send to Nephrology     I discussed the assessment and treatment plan with the patient. The patient was provided an opportunity to ask questions and all were answered. The  patient agreed with the plan and demonstrated an understanding of the instructions.   The patient was advised to call back or seek an in-person evaluation if the symptoms worsen or if the condition fails to improve as anticipated.   [name], NP

## 2019-02-18 ENCOUNTER — Other Ambulatory Visit: Payer: Self-pay

## 2019-02-18 ENCOUNTER — Ambulatory Visit (HOSPITAL_COMMUNITY)
Admission: EM | Admit: 2019-02-18 | Discharge: 2019-02-18 | Disposition: A | Discharge status: Home / Self Care | Payer: BLUE CROSS/BLUE SHIELD | Attending: Family Medicine | Admitting: Family Medicine

## 2019-02-18 ENCOUNTER — Telehealth: Payer: Self-pay | Admitting: Cardiology

## 2019-02-18 ENCOUNTER — Encounter (HOSPITAL_COMMUNITY): Payer: Self-pay

## 2019-02-18 ENCOUNTER — Ambulatory Visit: Payer: Self-pay

## 2019-02-18 DIAGNOSIS — I10 Essential (primary) hypertension: Secondary | ICD-10-CM

## 2019-02-18 MED ORDER — METOPROLOL TARTRATE 25 MG PO TABS: 12.5000 mg | ORAL_TABLET | Freq: Two times a day (BID) | ORAL | 0 refills | Status: DC

## 2019-02-18 NOTE — Telephone Encounter (Signed)
Incoming call  From Patient who complains of evated blood Pressure. Patient has been on several different medications.  Obtained two  Values.  170/100 this am and 177/110.  Denies headache and dizziness and  Blurred vision.  Reviewed protocol  And provided care advice.  Recommended Pt. Go to ED ur Urgent care. For futher assesment.              Reason for Disposition . [2] Systolic BP  >= 706 OR Diastolic >= 237 AND [6] cardiac or neurologic symptoms (e.g., chest pain, difficulty breathing, unsteady gait, blurred vision)  Answer Assessment - Initial Assessment Questions 1. BLOOD PRESSURE: "What is the blood pressure?" "Did you take at least two measurements 5 minutes apart?"    170/100 177/110 2. ONSET: "When did you take your blood pressure?"      3. HOW: "How did you obtain the blood pressure?" (e.g., visiting nurse, automatic home BP monitor)     automatic 4. HISTORY: "Do you have a history of high blood pressure?"     *No Answer* 5. MEDICATIONS: "Are you taking any medications for blood pressure?" "Have you missed any doses recently?"      6. OTHER SYMPTOMS: "Do you have any symptoms?" (e.g., headache, chest pain, blurred vision, difficulty breathing, weakness)  anxiety type.issuse   7. PREGNANCY: "Is there any chance you are pregnant?" "When was your last menstrual period?"     *No Answer*  Protocols used: HIGH BLOOD PRESSURE-A-AH

## 2019-02-18 NOTE — Telephone Encounter (Signed)
New message:    Patient calling because she went to Urgent Care and they told her to call and speak with some one because her BP keeps going up an down. Patient she need to speak with some one concering her medication. Please call patient.

## 2019-02-18 NOTE — Telephone Encounter (Signed)
Pt at [facility] UC for evaluation.

## 2019-02-18 NOTE — Telephone Encounter (Signed)
Called and left message that pt could call back but looks like her PCP recently changed her BP meds so she should reach out to them to discuss BP.  Also advised pt hasn't been seen in the office since 10/2017 and would need to be seen for further work up.

## 2019-02-18 NOTE — ED Triage Notes (Signed)
Pt state she has started a new B/P medicine and it causing her B/P to go up. 170/ 100 am today.  Pt states her PCP changed her B/P in June.

## 2019-02-19 ENCOUNTER — Telehealth: Payer: Self-pay | Admitting: Adult Health

## 2019-02-19 ENCOUNTER — Other Ambulatory Visit: Payer: BLUE CROSS/BLUE SHIELD

## 2019-02-19 NOTE — ED Provider Notes (Signed)
Janet Sanchez   [id number] 02/18/19 Arrival Time: 2542  ASSESSMENT & PLAN:  1. Essential hypertension    Tough treatment choices since she reports adverse affects from the most common anti-hypertensive medicine classes. After discussion she elects to start a low dose of: Meds ordered this encounter  Medications  . metoprolol tartrate (LOPRESSOR) 25 MG tablet    Sig: Take 0.5 tablets (12.5 mg total) by mouth 2 (two) times daily.    Dispense:  60 tablet    Refill:  0   Repeat BP 160/70. Reassured. She plans to f/u with her PCP or cardiologist to discuss further management.  Reviewed expectations re: course of current medical issues. Questions answered. Outlined signs and symptoms indicating need for more acute intervention. Patient verbalized understanding. After Visit Summary given.   SUBJECTIVE:  Janet Sanchez is a 48 y.o. female who presents with concerns regarding increased blood pressures. She has been on multiple medicines in the past but reports many side effects to whatever she has tried. "Just wanted someone to check my blood pressure today." Long-standing HTN reported. FH of HTN.  She reports taking medications as instructed, no medication side effects noted, no TIA's, no chest pain on exertion, no dyspnea on exertion and no swelling of ankles.  Denies symptoms of chest pain, palpations, orthopnea, nocturnal dyspnea, or LE edema.  Social History   Tobacco Use  Smoking Status Never Smoker  Smokeless Tobacco Never Used    ROS: As per HPI. All other systems negative.    OBJECTIVE:  Vitals:   02/18/19 1503 02/18/19 1506  BP: (!) 182/92   Pulse: 80   Resp: 16   Temp: 98.3 F (36.8 C)   TempSrc: Oral   SpO2: 99%   Weight:  107.5 kg    General appearance: alert; no distress Eyes: PERRLA; EOMI HENT: normocephalic; atraumatic Neck: supple Lungs: clear to auscultation bilaterally Heart: regular rate and rhythm without murmer Abdomen: soft,  non-tender; bowel sounds normal Extremities: no edema; symmetrical with no gross deformities Skin: warm and dry Psychological: alert and cooperative; normal mood and affect  Investigations Reviewed: Results for orders placed or performed in visit on 12/19/18  CBC with Differential/Platelet  Result Value Ref Range   WBC 3.3 (L) 4.0 - 10.5 K/uL   RBC 4.23 3.87 - 5.11 Mil/uL   Hemoglobin 12.2 12.0 - 15.0 g/dL   HCT 36.0 36.0 - 46.0 %   MCV 85.1 78.0 - 100.0 fl   MCHC 34.0 30.0 - 36.0 g/dL   RDW 14.6 11.5 - 15.5 %   Platelets 305.0 150.0 - 400.0 K/uL   Neutrophils Relative % 58.2 43.0 - 77.0 %   Lymphocytes Relative 32.8 12.0 - 46.0 %   Monocytes Relative 6.5 3.0 - 12.0 %   Eosinophils Relative 1.8 0.0 - 5.0 %   Basophils Relative 0.7 0.0 - 3.0 %   Neutro Abs 1.9 1.4 - 7.7 K/uL   Lymphs Abs 1.1 0.7 - 4.0 K/uL   Monocytes Absolute 0.2 0.1 - 1.0 K/uL   Eosinophils Absolute 0.1 0.0 - 0.7 K/uL   Basophils Absolute 0.0 0.0 - 0.1 K/uL  Comprehensive metabolic panel  Result Value Ref Range   Sodium 140 135 - 145 mEq/L   Potassium 3.8 3.5 - 5.1 mEq/L   Chloride 100 96 - 112 mEq/L   CO2 31 19 - 32 mEq/L   Glucose, Bld 78 70 - 99 mg/dL   BUN 14 6 - 23 mg/dL   Creatinine, Ser 0.96  0.40 - 1.20 mg/dL   Total Bilirubin 0.6 0.2 - 1.2 mg/dL   Alkaline Phosphatase 91 39 - 117 U/L   AST 21 0 - 37 U/L   ALT 24 0 - 35 U/L   Total Protein 7.9 6.0 - 8.3 g/dL   Albumin 4.1 3.5 - 5.2 g/dL   Calcium 9.4 8.4 - 10.5 mg/dL   GFR 75.11 >60.00 mL/min  Lipid panel  Result Value Ref Range   Cholesterol 206 (H) 0 - 200 mg/dL   Triglycerides 70.0 0.0 - 149.0 mg/dL   HDL 70.20 >39.00 mg/dL   VLDL 14.0 0.0 - 40.0 mg/dL   LDL Cholesterol 122 (H) 0 - 99 mg/dL   Total CHOL/HDL Ratio 3    NonHDL 136.27   TSH  Result Value Ref Range   TSH 1.89 0.35 - 4.50 uIU/mL  POCT Urinalysis Dipstick (Automated)  Result Value Ref Range   Color, UA yellow    Clarity, UA hazy    Glucose, UA Negative Negative    Bilirubin, UA neg    Ketones, UA neg    Spec Grav, UA 1.015 1.010 - 1.025   Blood, UA neg    pH, UA 7.0 5.0 - 8.0   Protein, UA Negative Negative   Urobilinogen, UA 0.2 0.2 or 1.0 E.U./dL   Nitrite, UA neg    Leukocytes, UA Negative Negative    Allergies  Allergen Reactions  . Potassium-Containing Compounds Other (See Comments)    Severe cramps caused her to pass out.  Marland Kitchen Hctz [Hydrochlorothiazide] Other (See Comments)    Renal impairment   . Lisinopril Other (See Comments)    Headache     Past Medical History:  Diagnosis Date  . Enlarged tonsils   . Herpes   . Hypertension   . Migraines   . Morbid obesity ([place])   . Vasovagal syncope 10/24/2017   Social History   Socioeconomic History  . Marital status: Married    Spouse name: Not on file  . Number of children: Not on file  . Years of education: Not on file  . Highest education level: Not on file  Occupational History  . Not on file  Social Needs  . Financial resource strain: Not on file  . Food insecurity    Worry: Not on file    Inability: Not on file  . Transportation needs    Medical: Not on file    Non-medical: Not on file  Tobacco Use  . Smoking status: Never Smoker  . Smokeless tobacco: Never Used  Substance and Sexual Activity  . Alcohol use: No  . Drug use: No  . Sexual activity: Yes    Birth control/protection: Surgical  Lifestyle  . Physical activity    Days per week: Not on file    Minutes per session: Not on file  . Stress: Not on file  Relationships  . Social Herbalist on phone: Not on file    Gets together: Not on file    Attends religious service: Not on file    Active member of club or organization: Not on file    Attends meetings of clubs or organizations: Not on file    Relationship status: Not on file  . Intimate partner violence    Fear of current or ex partner: Not on file    Emotionally abused: Not on file    Physically abused: Not on file    Forced sexual  activity: Not on file  Other Topics Concern  . 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 Heart attack Mother 57  . Hypertension Father   . Heart attack Father 11  . Hypertension Sister   . Hypertension Brother   . Arrhythmia Brother   . Hypertension Sister    Past Surgical History:  Procedure Laterality Date  . TUBAL LIGATION        Vanessa Kick, MD 02/19/19 [id number]

## 2019-02-19 NOTE — Telephone Encounter (Signed)
Patient states she was exposed to Covid at her job 1 week ago.  Patient came to office for labs but we couldn't let her come in.  She is requesting a call back about her BP because she went to UC yesterday with a  183/100 BP reading and wants to know what she can do until next week when she can come in the building for labs.  Patient can't come in until next Wednesday per [place].

## 2019-02-20 NOTE — Telephone Encounter (Signed)
Have her take the medication that I prescribed as well as the ER. Follow up next week. If BP has not improved, we will refer to nephrology

## 2019-02-20 NOTE — Telephone Encounter (Signed)
Spoke to the pt and informed her of below message.  She will monitor her bp over the weekend while taking the medications that Tommi Rumps has prescribed along with the medication she was given in the ED.  Will call back next week to make an appointment if her BP is not controlled.  Nothing further needed.

## 2019-02-21 ENCOUNTER — Encounter: Payer: Self-pay | Admitting: Adult Health

## 2019-02-23 ENCOUNTER — Encounter: Payer: Self-pay | Admitting: Adult Health

## 2019-02-25 ENCOUNTER — Other Ambulatory Visit: Payer: Self-pay | Admitting: Adult Health

## 2019-02-25 DIAGNOSIS — I10 Essential (primary) hypertension: Secondary | ICD-10-CM

## 2019-02-25 NOTE — Telephone Encounter (Signed)
Copied from [place] [phone]. Topic: Appointment Scheduling - Scheduling Inquiry for Clinic >> Feb 25, 2019  1:12 PM Lennox Solders wrote: Reason for CRM: pt is calling to make an virtual appt with cory concerning bp. Called office 3x >> Feb 25, 2019  4:48 PM Ines Bloomer, [place] wrote: Patient has a lab appointment Wednesday and sent a message today requesting a virtual visit. Can you advise this patient please?

## 2019-02-26 ENCOUNTER — Other Ambulatory Visit (INDEPENDENT_AMBULATORY_CARE_PROVIDER_SITE_OTHER): Payer: BLUE CROSS/BLUE SHIELD

## 2019-02-26 ENCOUNTER — Other Ambulatory Visit: Payer: Self-pay

## 2019-02-26 DIAGNOSIS — I10 Essential (primary) hypertension: Secondary | ICD-10-CM | POA: Diagnosis not present

## 2019-02-27 LAB — BASIC METABOLIC PANEL
BUN: 12 mg/dL (ref 6–23)
CO2: 29 mEq/L (ref 19–32)
Calcium: 9.3 mg/dL (ref 8.4–10.5)
Chloride: 101 mEq/L (ref 96–112)
Creatinine, Ser: 1.05 mg/dL (ref 0.40–1.20)
GFR: 67.67 mL/min (ref >60.00)
Glucose, Bld: 63 mg/dL — ABNORMAL LOW (ref 70–99)
Potassium: 3.5 mEq/L (ref 3.5–5.1)
Sodium: 138 mEq/L (ref 135–145)

## 2019-03-10 ENCOUNTER — Emergency Department (HOSPITAL_COMMUNITY): Payer: BLUE CROSS/BLUE SHIELD

## 2019-03-10 ENCOUNTER — Emergency Department (HOSPITAL_COMMUNITY)
Admission: EM | Admit: 2019-03-10 | Discharge: 2019-03-11 | Disposition: A | Discharge status: Home / Self Care | Payer: BLUE CROSS/BLUE SHIELD | Attending: Emergency Medicine | Admitting: Emergency Medicine

## 2019-03-10 ENCOUNTER — Encounter (HOSPITAL_COMMUNITY): Payer: Self-pay | Admitting: Emergency Medicine

## 2019-03-10 ENCOUNTER — Other Ambulatory Visit: Payer: Self-pay

## 2019-03-10 DIAGNOSIS — Z7982 Long term (current) use of aspirin: Secondary | ICD-10-CM | POA: Diagnosis not present

## 2019-03-10 DIAGNOSIS — R002 Palpitations: Secondary | ICD-10-CM | POA: Insufficient documentation

## 2019-03-10 DIAGNOSIS — I10 Essential (primary) hypertension: Secondary | ICD-10-CM | POA: Insufficient documentation

## 2019-03-10 DIAGNOSIS — R079 Chest pain, unspecified: Secondary | ICD-10-CM | POA: Diagnosis not present

## 2019-03-10 DIAGNOSIS — Z79899 Other long term (current) drug therapy: Secondary | ICD-10-CM | POA: Diagnosis not present

## 2019-03-10 LAB — BRAIN NATRIURETIC PEPTIDE: B Natriuretic Peptide: 65 pg/mL (ref 0.0–100.0)

## 2019-03-10 LAB — CBC
HCT: 37.3 % (ref 36.0–46.0)
Hemoglobin: 11.8 g/dL — ABNORMAL LOW (ref 12.0–15.0)
MCH: 27.4 pg (ref 26.0–34.0)
MCHC: 31.6 g/dL (ref 30.0–36.0)
MCV: 86.5 fL (ref 80.0–100.0)
Platelets: 277 10*3/uL (ref 150–400)
RBC: 4.31 MIL/uL (ref 3.87–5.11)
RDW: 13.5 % (ref 11.5–15.5)
WBC: 6 10*3/uL (ref 4.0–10.5)
nRBC: 0 % (ref 0.0–0.2)

## 2019-03-10 LAB — TROPONIN I (HIGH SENSITIVITY)
Troponin I (High Sensitivity): 4 ng/L (ref <18)
Troponin I (High Sensitivity): 4 ng/L (ref <18)

## 2019-03-10 LAB — BASIC METABOLIC PANEL
Anion gap: 11 (ref 5–15)
BUN: 13 mg/dL (ref 6–20)
CO2: 25 mmol/L (ref 22–32)
Calcium: 9.2 mg/dL (ref 8.9–10.3)
Chloride: 100 mmol/L (ref 98–111)
Creatinine, Ser: 1.21 mg/dL — ABNORMAL HIGH (ref 0.44–1.00)
GFR calc Af Amer: >60 mL/min (ref >60)
GFR calc non Af Amer: 53 mL/min — ABNORMAL LOW (ref >60)
Glucose, Bld: 77 mg/dL (ref 70–99)
Potassium: 3.1 mmol/L — ABNORMAL LOW (ref 3.5–5.1)
Sodium: 136 mmol/L (ref 135–145)

## 2019-03-10 LAB — I-STAT BETA HCG BLOOD, ED (MC, WL, AP ONLY): I-stat hCG, quantitative: <5 m[IU]/mL (ref <5)

## 2019-03-10 MED ORDER — SODIUM CHLORIDE 0.9% FLUSH: 3.0000 mL | Freq: Once | INTRAVENOUS | Status: DC

## 2019-03-10 NOTE — ED Triage Notes (Signed)
Pt c/o L upper chest discomfort since returning home from work this afternoon, pt states she feels like she is having side affects from 2 of her BP meds so she did not take them today. Pt reports feeling her heart racing today and shob with exertion.

## 2019-03-10 NOTE — ED Notes (Signed)
Pt to CXR via WC

## 2019-03-11 MED ORDER — LOSARTAN POTASSIUM 25 MG PO TABS: 25.0000 mg | ORAL_TABLET | Freq: Every day | ORAL | 0 refills | Status: DC

## 2019-03-11 MED ORDER — LOSARTAN POTASSIUM 50 MG PO TABS
25.0000 mg | ORAL_TABLET | Freq: Once | ORAL | Status: AC
  Administered 2019-03-11: 25 mg via ORAL
  Filled 2019-03-11: qty 1

## 2019-03-11 NOTE — ED Provider Notes (Signed)
[facility] EMERGENCY DEPARTMENT Provider Note   CSN: [id number] Arrival date & time: 03/10/19  7619     History   Chief Complaint Chief Complaint  Patient presents with   Chest Pain    HPI Janet Sanchez is a 48 y.o. female.      Hypertension This is a chronic problem. The current episode started more than 1 week ago. The problem occurs constantly. The problem has been gradually worsening. Pertinent negatives include no chest pain. Nothing aggravates the symptoms. Nothing relieves the symptoms. She has tried nothing for the symptoms. The treatment provided no relief.    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 Authorizing Provider  acyclovir (ZOVIRAX) 400 MG tablet Take 1 tablet (400 mg total) by mouth 2 (two) times daily. 05/28/18   Nafziger, Tommi Rumps, NP  albuterol (PROVENTIL HFA;VENTOLIN HFA) 108 (90 Base) MCG/ACT inhaler Inhale 2 puffs into the lungs every 6 (six) hours as needed for wheezing or shortness of breath.  09/26/18   Nafziger, Tommi Rumps, NP  aspirin EC 81 MG tablet Take 81 mg by mouth daily.    [provider]  furosemide (LASIX) 20 MG tablet Take 1 tablet (20 mg total) by mouth daily. 02/14/19   Nafziger, Tommi Rumps, NP  gabapentin (NEURONTIN) 100 MG capsule Take 2 capsules (200 mg total) by mouth at bedtime. 02/13/19   Lyndal Pulley, DO  hydrALAZINE (APRESOLINE) 50 MG tablet Take 1 tablet (50 mg total) by mouth 3 (three) times daily. 02/07/19 05/08/19  Nafziger, Tommi Rumps, NP  ibuprofen (ADVIL,MOTRIN) 200 MG tablet Take 800 mg by mouth 2 (two) times daily as needed for headache. Per neurologist: limit to 2 days a week to avoid rebound headaches.    [provider]  losartan (COZAAR) 25 MG tablet Take 1 tablet (25 mg total) by mouth daily. 03/11/19   Javona Bergevin, Corene Cornea, MD  metoprolol tartrate (LOPRESSOR) 25 MG tablet Take 0.5 tablets (12.5 mg total) by mouth 2 (two) times daily. 02/18/19   Vanessa Kick, MD    Family History Family History  Problem Relation Age of Onset   Heart attack Mother 22   Hypertension Father    Heart attack Father 34   Hypertension Sister    Hypertension  Brother    Arrhythmia Brother    Hypertension Sister     Social History Social History   Tobacco Use   Smoking status: Never Smoker   Smokeless tobacco: Never Used  Substance Use Topics   Alcohol use: No   Drug use: No     Allergies   Potassium-containing compounds, Hctz [hydrochlorothiazide], and Lisinopril   Review of Systems Review of Systems  Cardiovascular: Negative for chest pain.  All other systems reviewed and are negative.    Physical Exam Updated Vital Signs BP (!) 170/98 (BP Location: Right Arm)    Pulse 64    Temp 98.5 F (36.9 C) (Oral)    Resp 18    LMP 03/09/2019    SpO2 100%   Physical Exam Vitals signs and nursing note reviewed.  Constitutional:      Appearance: She is well-developed.  HENT:     Head: Normocephalic and atraumatic.     Mouth/Throat:     Mouth:  Mucous membranes are dry.     Pharynx: Oropharynx is clear.  Eyes:     Conjunctiva/sclera: Conjunctivae normal.  Neck:     Musculoskeletal: Normal range of motion.  Cardiovascular:     Rate and Rhythm: Normal rate and regular rhythm.  Pulmonary:     Effort: Pulmonary effort is normal. No tachypnea or respiratory distress.     Breath sounds: Normal breath sounds. No stridor. No decreased breath sounds.  Abdominal:     General: There is no distension or abdominal bruit.     Palpations: Abdomen is soft. There is no fluid wave or splenomegaly.  Musculoskeletal: Normal range of motion.     Right lower leg: No edema.     Left lower leg: Edema (mild) present.  Skin:    General: Skin is warm and dry.  Neurological:     General: No focal deficit present.     Mental Status: She is alert.      ED Treatments / Results  Labs (all labs ordered are listed, but only abnormal results are displayed) Labs Reviewed  BASIC METABOLIC PANEL - Abnormal; Notable for the following components:      Result Value   Potassium 3.1 (*)    Creatinine, Ser 1.21 (*)    GFR calc non Af Amer 53 (*)    All other components within normal limits  CBC - Abnormal; Notable for the following components:   Hemoglobin 11.8 (*)    All other components within normal limits  BRAIN NATRIURETIC PEPTIDE  I-STAT BETA HCG BLOOD, ED ([facility], [facility], [facility] ONLY)  TROPONIN I (HIGH SENSITIVITY)  TROPONIN I (HIGH SENSITIVITY)    EKG EKG Interpretation  Date/Time:  Monday March 10 2019 19:40:26 EDT Ventricular Rate:  68 PR Interval:  162 QRS Duration: 88 QT Interval:  406 QTC Calculation: 431 R Axis:   28 Text Interpretation:  Normal sinus rhythm Septal infarct , age undetermined Abnormal ECG No significant change since last tracing Confirmed by Merrily Pew [phone]) on 03/10/2019 11:33:20 PM   Radiology Dg Chest 2 View  Result Date: 03/10/2019 CLINICAL DATA:  Palpitations.  Chest pain. EXAM: CHEST - 2 VIEW COMPARISON:  January 01, 2018 FINDINGS: The cardiac silhouette is mildly enlarged. There is no significant pleural effusion. No focal infiltrate. No pneumothorax. There is no acute osseous abnormality. There are mild degenerative changes throughout the thoracic spine. IMPRESSION: No active cardiopulmonary disease. Electronically Signed   By: Constance Holster M.D.   On: 03/10/2019 20:03  Procedures Procedures (including critical care time)  Medications Ordered in ED Medications  sodium chloride flush (NS) 0.9 % injection 3 mL (3 mLs Intravenous Not Given 03/11/19 0345)  losartan (COZAAR) tablet 25 mg (has no administration in time range)     Initial Impression / Assessment and Plan / ED Course  I have reviewed the triage vital signs and the nursing notes.  Pertinent labs & imaging results that were available during my care of the patient were reviewed by me and considered in my medical decision making (see chart for details).   Patient with hypertension and some intermittent palpitations and chest pain since having multiple adjustments to her medication secondary to perceived side effects.  Patient's work-up here is unremarkable.  No evidence of acute pulmonary edema, stroke, heart attack, renal dysfunction.  She is slightly hypokalemic and should take some potassium at home.  She also wants to try different blood pressure medication so we will try losartan and have close PCP follow-up.  Final Clinical Impressions(s) / ED Diagnoses   Final diagnoses:  Hypertension, unspecified type    ED Discharge Orders         Ordered    losartan (COZAAR) 25 MG tablet  Daily     03/11/19 0429           Mckaela Howley, Corene Cornea, MD 03/11/19 [phone]

## 2019-03-13 ENCOUNTER — Encounter: Payer: Self-pay | Admitting: Adult Health

## 2019-03-13 ENCOUNTER — Ambulatory Visit: Payer: Self-pay | Admitting: *Deleted

## 2019-03-13 ENCOUNTER — Other Ambulatory Visit: Payer: Self-pay

## 2019-03-13 ENCOUNTER — Ambulatory Visit (INDEPENDENT_AMBULATORY_CARE_PROVIDER_SITE_OTHER): Payer: BLUE CROSS/BLUE SHIELD | Admitting: Adult Health

## 2019-03-13 VITALS — BP 160/90 | Temp 98.9°F | Wt 236.0 lb

## 2019-03-13 DIAGNOSIS — I10 Essential (primary) hypertension: Secondary | ICD-10-CM

## 2019-03-13 MED ORDER — IRBESARTAN 150 MG PO TABS: 150.0000 mg | ORAL_TABLET | Freq: Every day | ORAL | 1 refills | Status: DC

## 2019-03-13 NOTE — Telephone Encounter (Signed)
Pt called stating her BP was 164/124 03/13/2019 at 0620; this was taken with cuff on right middle arm; the pt says that she is having flutters in her chest, but was seen in the ED and was started on Losartan on 03/11/2019; she stopped taking the medication on 03/11/2019 because it made her have anxiety; the pt said that restarted taking her hydralazine but stopped on the pm of 8/19/020; she started metoprolol 03/05/19 and stopped on 03/10/2019; the pt said that she took hydralzaine and clonidine this morning at 0626; other readings are: 8/18 168/105 at 1016 and 190/115 at 1844; 8/19 137/102 at 1807; recommendations made per nurse triage protocol; she verbalized understanding; the pt sees [facility], [facility] and has appt today at 1530; pt transferred to [facility] to see if appt could be changed to earlier time. Reason for Disposition . [3] Systolic BP  >= 888 OR Diastolic >= 757  AND [9] having NO cardiac or neurologic symptoms  Answer Assessment - Initial Assessment Questions 1. BLOOD PRESSURE: "What is the blood pressure?" "Did you take at least two measurements 5 minutes apart?"     164/124  2. ONSET: "When did you take your blood pressure?"     03/13/2019 at 062 3. HOW: "How did you obtain the blood pressure?" (e.g., visiting nurse, automatic home BP monitor)    Home BP medication 4. HISTORY: "Do you have a history of high blood pressure?"    yes 5. MEDICATIONS: "Are you taking any medications for blood pressure?" "Have you missed any doses recently?"     yes 6. OTHER SYMPTOMS: "Do you have any symptoms?" (e.g., headache, chest pain, blurred vision, difficulty breathing, weakness)    "little chest fluttering; side effect of medication"; pt seen in ED 7. PREGNANCY: "Is there any chance you are pregnant?" "When was your last menstrual period?"     No, currently on menses  Protocols used: HIGH BLOOD PRESSURE-A-AH

## 2019-03-13 NOTE — Progress Notes (Signed)
Subjective:    Patient ID: Janet Sanchez, female    DOB: Apr 28, 1971, 48 y.o.   MRN: [id number]  HPI  48 year old female who  has a past medical history of Enlarged tonsils, Herpes, Hypertension, Migraines, Morbid obesity ([place]), and Vasovagal syncope (10/24/2017).  She presents to the office today for follow-up regarding hypertension.  Most recently placed on hydralazine 50 mg 3 times a day and Lasix 20 mg daily.  Last month she was seen in the emergency room for hypertension and was started on metoprolol she reports that this caused her to have a "pounding headache, and fluttering in her chest as well as shortness of breath".  She has had multiple blood pressure medications in the past but these had to be stopped to potential side effects  1.lisinopril caused a headache 2.Cozaar caused palpitations 3. Atenolol caused a "foggy feeling" 4.HCTZ caused renal impairment 5. Clonidine causes worsening headache 6. Metoprolol - SOB, palpitations 7. Lasix - muscle cramps and rash 8. Losartan - increased anxiety.  9. Hydralazine - " makes my blood pressure increase"  He continues to have blood pressure readings mostly in the 160s to 190s.  She has been referred to nephrology for further management  Review of Systems See HPI   Past Medical History:  Diagnosis Date  . Enlarged tonsils   . Herpes   . Hypertension   . Migraines   . Morbid obesity ([place])   . Vasovagal syncope 10/24/2017    Social History   Socioeconomic History  . Marital status: Married    Spouse name: Not on file  . Number of children: Not on file  . Years of education: Not on file  . Highest education level: Not on file  Occupational History  . Not on file  Social Needs  . Financial resource strain: Not on file  . Food insecurity    Worry: Not on file    Inability: Not on file  . Transportation needs    Medical: Not on file    Non-medical: Not on file  Tobacco Use  . Smoking status: Never Smoker   . Smokeless tobacco: Never Used  Substance and Sexual Activity  . Alcohol use: No  . Drug use: No  . Sexual activity: Yes    Birth control/protection: Surgical  Lifestyle  . Physical activity    Days per week: Not on file    Minutes per session: Not on file  . Stress: Not on file  Relationships  . Social Herbalist on phone: Not on file    Gets together: Not on file    Attends religious service: Not on file    Active member of club or organization: Not on file    Attends meetings of clubs or organizations: Not on file    Relationship status: Not on file  . Intimate partner violence    Fear of current or ex partner: Not on file    Emotionally abused: Not on file    Physically abused: Not on file    Forced sexual activity: Not on file  Other Topics Concern  . 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 TUBAL LIGATION      Family History  Problem Relation Age of Onset  . Heart attack  Mother 53  . Hypertension Father   . Heart attack Father 44  . Hypertension Sister   . Hypertension Brother   . Arrhythmia Brother   . Hypertension Sister     Allergies  Allergen Reactions  . Potassium-Containing Compounds Other (See Comments)    Severe cramps caused her to pass out.  Marland Kitchen Hctz [Hydrochlorothiazide] Other (See Comments)    Renal impairment   . Lisinopril Other (See Comments)    Headache     Current Outpatient Medications on File Prior to Visit  Medication Sig Dispense Refill  . acyclovir (ZOVIRAX) 400 MG tablet Take 1 tablet (400 mg total) by mouth 2 (two) times daily. 180 tablet 1  . albuterol (PROVENTIL HFA;VENTOLIN HFA) 108 (90 Base) MCG/ACT inhaler Inhale 2 puffs into the lungs every 6 (six) hours as needed for wheezing or shortness of breath. 1 Inhaler 2  . aspirin EC 81 MG tablet Take 81 mg by mouth  daily.    . cloNIDine (CATAPRES) 0.1 MG tablet Take 0.1 mg by mouth 2 (two) times daily.    . furosemide (LASIX) 20 MG tablet Take 1 tablet (20 mg total) by mouth daily. 30 tablet 0  . gabapentin (NEURONTIN) 100 MG capsule Take 2 capsules (200 mg total) by mouth at bedtime. 180 capsule 3  . hydrALAZINE (APRESOLINE) 50 MG tablet Take 1 tablet (50 mg total) by mouth 3 (three) times daily. 270 tablet 0  . ibuprofen (ADVIL,MOTRIN) 200 MG tablet Take 800 mg by mouth 2 (two) times daily as needed for headache. Per neurologist: limit to 2 days a week to avoid rebound headaches.     No current facility-administered medications on file prior to visit.     BP (!) 160/90   Temp 98.9 F (37.2 C)   Wt 236 lb (107 kg)   LMP 03/09/2019   BMI 41.81 kg/m       Objective:   Physical Exam Vitals signs and nursing note reviewed.  Constitutional:      Appearance: Normal appearance.  HENT:     Head: Normocephalic and atraumatic.     Right Ear: Tympanic membrane, ear canal and external ear normal. There is no impacted cerumen.     Left Ear: Tympanic membrane, ear canal and external ear normal. There is no impacted cerumen.  Eyes:     Extraocular Movements: Extraocular movements intact.     Pupils: Pupils are equal, round, and reactive to light.  Cardiovascular:     Rate and Rhythm: Normal rate and regular rhythm.     Pulses: Normal pulses.     Heart sounds: Normal heart sounds. No murmur. No friction rub. No gallop.   Neurological:     Mental Status: She is alert.  Psychiatric:        Mood and Affect: Mood normal.        Behavior: Behavior normal.        Thought Content: Thought content normal.        Judgment: Judgment normal.       Assessment & Plan:  1. Essential hypertension -Prescribed Avapro.  She is not to stop this medication for 30 days.  Think a big issue with her is stopping the medication as soon as she feels a side effect.  She will report back to me her blood pressures via my  chart in 2 weeks - irbesartan (AVAPRO) 150 MG tablet; Take 1 tablet (150 mg total) by mouth daily.  Dispense: 30 tablet; Refill: 1  Chenae Brager  Rondall Radigan, NP

## 2019-03-13 NOTE — Telephone Encounter (Signed)
Pt scheduled for visit today

## 2019-03-16 NOTE — Progress Notes (Deleted)
[facility] [place],  [address] Phone: [phone] Subjective:    I'm seeing this patient by the request  of:  [name], NP   CC: Bilateral wrist pain   [id number]  Janet Sanchez is a 48 y.o. female coming in with complaint of ***  Onset-  Location Duration-  Character- Aggravating factors- Reliving factors-  Therapies tried-  Severity-     Past Medical History:  Diagnosis Date  . Enlarged tonsils   . Herpes   . Hypertension   . Migraines   . Morbid obesity ([place])   . Vasovagal syncope 10/24/2017   Past Surgical History:  Procedure Laterality Date  . TUBAL LIGATION     Social History   Socioeconomic History  . Marital status: Married    Spouse name: Not on file  . Number of children: Not on file  . Years of education: Not on file  . Highest education level: Not on file  Occupational History  . Not on file  Social Needs  . Financial resource strain: Not on file  . Food insecurity    Worry: Not on file    Inability: Not on file  . Transportation needs    Medical: Not on file    Non-medical: Not on file  Tobacco Use  . Smoking status: Never Smoker  . Smokeless tobacco: Never Used  Substance and Sexual Activity  . Alcohol use: No  . Drug use: No  . Sexual activity: Yes    Birth control/protection: Surgical  Lifestyle  . Physical activity    Days per week: Not on file    Minutes per session: Not on file  . Stress: Not on file  Relationships  . Social Herbalist on phone: Not on file    Gets together: Not on file    Attends religious service: Not on file    Active member of club or organization: Not on file    Attends meetings of clubs or organizations: Not on file    Relationship status: Not on file  Other Topics Concern  . 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 Potassium-Containing Compounds Other (See Comments)    Severe cramps caused her to pass out.  Marland Kitchen Hctz [Hydrochlorothiazide] Other (See Comments)    Renal impairment   . Lisinopril Other (See Comments)    Headache    Family History  Problem Relation Age of Onset  . Heart attack Mother 27  . Hypertension Father   . Heart attack Father 9  . Hypertension Sister   . Hypertension Brother   . Arrhythmia Brother   . Hypertension Sister      Current Outpatient Medications (Cardiovascular):  .  furosemide (LASIX) 20 MG tablet, Take 1 tablet (20 mg total) by mouth daily. .  irbesartan (AVAPRO) 150 MG tablet, Take 1 tablet (150 mg total) by mouth daily.  Current Outpatient Medications (Respiratory):  .  albuterol (PROVENTIL HFA;VENTOLIN HFA) 108 (90 Base) MCG/ACT inhaler, Inhale 2 puffs into the lungs every 6 (six) hours as needed for wheezing or shortness of breath.  Current Outpatient Medications (Analgesics):  .  aspirin EC 81 MG tablet, Take 81 mg by mouth daily. Marland Kitchen  ibuprofen (ADVIL,MOTRIN) 200 MG tablet, Take 800 mg by mouth  2 (two) times daily as needed for headache. Per neurologist: limit to 2 days a week to avoid rebound headaches.   Current Outpatient Medications (Other):  .  acyclovir (ZOVIRAX) 400 MG tablet, Take 1 tablet (400 mg total) by mouth 2 (two) times daily. Marland Kitchen  gabapentin (NEURONTIN) 100 MG capsule, Take 2 capsules (200 mg total) by mouth at bedtime.    Past medical history, social, surgical and family history all reviewed in electronic medical record.  No pertanent information unless stated regarding to the chief complaint.   Review of Systems:  No headache, visual changes, nausea, vomiting, diarrhea, constipation, dizziness, abdominal pain, skin rash, fevers, chills, night sweats, weight loss, swollen lymph nodes, body aches, joint swelling, muscle aches, chest pain, shortness of breath, mood changes.   Objective  Last menstrual  period 03/09/2019. Systems examined below as of    General: No apparent distress alert and oriented x3 mood and affect normal, dressed appropriately.  HEENT: Pupils equal, extraocular movements intact  Respiratory: Patient's speak in full sentences and does not appear short of breath  Cardiovascular: No lower extremity edema, non tender, no erythema  Skin: Warm dry intact with no signs of infection or rash on extremities or on axial skeleton.  Abdomen: Soft nontender  Neuro: Cranial nerves II through XII are intact, neurovascularly intact in all extremities with 2+ DTRs and 2+ pulses.  Lymph: No lymphadenopathy of posterior or anterior cervical chain or axillae bilaterally.  Gait normal with good balance and coordination.  MSK:  Non tender with full range of motion and good stability and symmetric strength and tone of shoulders, elbows, wrist, hip, knee and ankles bilaterally.     Impression and Recommendations:     This case required medical decision making of moderate complexity. The above documentation has been reviewed and is accurate and complete Lyndal Pulley, DO       Note: This dictation was prepared with Dragon dictation along with smaller phrase technology. Any transcriptional errors that result from this process are unintentional.

## 2019-03-17 ENCOUNTER — Encounter: Payer: Self-pay | Admitting: Adult Health

## 2019-03-17 ENCOUNTER — Ambulatory Visit: Payer: BLUE CROSS/BLUE SHIELD | Admitting: Family Medicine

## 2019-04-07 ENCOUNTER — Ambulatory Visit (INDEPENDENT_AMBULATORY_CARE_PROVIDER_SITE_OTHER): Payer: BLUE CROSS/BLUE SHIELD | Admitting: Family Medicine

## 2019-04-07 ENCOUNTER — Other Ambulatory Visit: Payer: Self-pay | Admitting: Adult Health

## 2019-04-07 ENCOUNTER — Other Ambulatory Visit: Payer: Self-pay

## 2019-04-07 ENCOUNTER — Encounter: Payer: Self-pay | Admitting: Family Medicine

## 2019-04-07 ENCOUNTER — Ambulatory Visit: Payer: Self-pay

## 2019-04-07 VITALS — BP 112/80 | HR 70 | Ht 63.0 in | Wt 235.0 lb

## 2019-04-07 DIAGNOSIS — G5603 Carpal tunnel syndrome, bilateral upper limbs: Secondary | ICD-10-CM

## 2019-04-07 DIAGNOSIS — M25532 Pain in left wrist: Secondary | ICD-10-CM

## 2019-04-07 DIAGNOSIS — I10 Essential (primary) hypertension: Secondary | ICD-10-CM

## 2019-04-07 NOTE — Progress Notes (Signed)
[facility] [address], [place] [address] Phone: [phone] Subjective:    I'm seeing this patient by the request  of:  [name], NP   CC: Bilateral wrist pain  [id number]   02/13/2019 Patient has had difficulty with wrist pain for quite some time.  Does have findings that are suggestive of carpal tunnel.  Home exercises given, bracing at night, topical anti-inflammatories and gabapentin given.  We discussed future considerations will include occupational therapy and potential injections.  Follow-up again in 4 weeks  04/07/2019 Janet Sanchez is a 48 y.o. female coming in with complaint of bilateral wrist pain. Carpal tunnel that is getting worse. Bitateral numbness and tingling. Issues mostly with sleeping at night. Has never received injections. Left is worse than right. Patient is right handed.   Onset- chronic  Location Duration-  Character- Aggravating factors- Reliving factors-  Therapies tried- nerve pain meds Severity-     Past Medical History:  Diagnosis Date  . Enlarged tonsils   . Herpes   . Hypertension   . Migraines   . Morbid obesity ([place])   . Vasovagal syncope 10/24/2017   Past Surgical History:  Procedure Laterality Date  . TUBAL LIGATION     Social History   Socioeconomic History  . Marital status: Married    Spouse name: Not on file  . Number of children: Not on file  . Years of education: Not on file  . Highest education level: Not on file  Occupational History  . Not on file  Social Needs  . Financial resource strain: Not on file  . Food insecurity    Worry: Not on file    Inability: Not on file  . Transportation needs    Medical: Not on file    Non-medical: Not on file  Tobacco Use  . Smoking status: Never Smoker  . Smokeless tobacco: Never Used  Substance and Sexual Activity  . Alcohol use: No  . Drug use: No  . Sexual activity: Yes    Birth control/protection: Surgical   Lifestyle  . Physical activity    Days per week: Not on file    Minutes per session: Not on file  . Stress: Not on file  Relationships  . Social Herbalist on phone: Not on file    Gets together: Not on file    Attends religious service: Not on file    Active member of club or organization: Not on file    Attends meetings of clubs or organizations: Not on file    Relationship status: Not on file  Other Topics Concern  . 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 Potassium-Containing Compounds Other (See Comments)    Severe cramps caused her to pass out.  Marland Kitchen Hctz [Hydrochlorothiazide] Other (See Comments)    Renal impairment   . Lisinopril Other (See Comments)    Headache    Family History  Problem Relation Age of Onset  . Heart attack Mother 50  . Hypertension Father   . Heart attack Father 43  . Hypertension Sister   . Hypertension Brother   . Arrhythmia Brother   . Hypertension Sister      Current Outpatient Medications (Cardiovascular):  .  furosemide (LASIX) 20 MG tablet, Take 1  tablet (20 mg total) by mouth daily. .  irbesartan (AVAPRO) 150 MG tablet, Take 1 tablet (150 mg total) by mouth daily.  Current Outpatient Medications (Respiratory):  .  albuterol (PROVENTIL HFA;VENTOLIN HFA) 108 (90 Base) MCG/ACT inhaler, Inhale 2 puffs into the lungs every 6 (six) hours as needed for wheezing or shortness of breath.  Current Outpatient Medications (Analgesics):  .  aspirin EC 81 MG tablet, Take 81 mg by mouth daily. Marland Kitchen  ibuprofen (ADVIL,MOTRIN) 200 MG tablet, Take 800 mg by mouth 2 (two) times daily as needed for headache. Per neurologist: limit to 2 days a week to avoid rebound headaches.   Current Outpatient Medications (Other):  .  acyclovir (ZOVIRAX) 400 MG tablet, Take 1 tablet (400 mg total) by mouth 2 (two) times daily. Marland Kitchen   gabapentin (NEURONTIN) 100 MG capsule, Take 2 capsules (200 mg total) by mouth at bedtime.    Past medical history, social, surgical and family history all reviewed in electronic medical record.  No pertanent information unless stated regarding to the chief complaint.   Review of Systems:  No headache, visual changes, nausea, vomiting, diarrhea, constipation, dizziness, abdominal pain, skin rash, fevers, chills, night sweats, weight loss, swollen lymph nodes, body aches, joint swelling, , chest pain, shortness of breath, mood changes.  Positive muscle aches  Objective  Blood pressure 112/80, pulse 70, height 5\' 3"  (1.6 m), weight 235 lb (106.6 kg), last menstrual period 03/09/2019, SpO2 98 %.    General: No apparent distress alert and oriented x3 mood and affect normal, dressed appropriately.  HEENT: Pupils equal, extraocular movements intact  Respiratory: Patient's speak in full sentences and does not appear short of breath  Cardiovascular: No lower extremity edema, non tender, no erythema  Skin: Warm dry intact with no signs of infection or rash on extremities or on axial skeleton.  Abdomen: Soft nontender  Neuro: Cranial nerves II through XII are intact, neurovascularly intact in all extremities with 2+ DTRs and 2+ pulses.  Lymph: No lymphadenopathy of posterior or anterior cervical chain or axillae bilaterally.  Gait normal with good balance and coordination.  MSK:  Non tender with full range of motion and good stability and symmetric strength and tone of shoulders, elbows, hip, knee and ankles bilaterally.  Wrist: bilaterally  Inspection normal with no visible erythema or swelling. ROM smooth and normal with good flexion and extension and ulnar/radial deviation that is symmetrical with opposite wrist. Palpation is normal over metacarpals, navicular, lunate, and TFCC; tendons without tenderness/ swelling No snuffbox tenderness. No tenderness over Canal of [place]. Strength 5/5 in all  directions without pain. Negative Finkelstein, positive Tinel's and phalens bilaterally. Negative Watson's test.  Limited musculoskeletal ultrasound was performed and interpreted by Lyndal Pulley  Limited ultrasound of patient's median nerve does still show some inflammation noted left greater than right of the hypoechoic changes within the sheath   Impression and Recommendations:      The above documentation has been reviewed and is accurate and complete Lyndal Pulley, DO       Note: This dictation was prepared with Dragon dictation along with smaller phrase technology. Any transcriptional errors that result from this process are unintentional.

## 2019-04-07 NOTE — Patient Instructions (Signed)
[phone] 

## 2019-04-07 NOTE — Assessment & Plan Note (Signed)
Bilateral carpal tunnel.  Failed conservative therapy.  Patient since then due to financial constraints sometimes unable to do occupational therapy at this moment.  Patient wants to hold on injection until she can have somebody with her.  Patient will schedule this at a later date.  Continue conservative therapy otherwise.

## 2019-04-29 ENCOUNTER — Telehealth: Payer: Self-pay

## 2019-04-29 ENCOUNTER — Encounter: Payer: Self-pay | Admitting: Cardiology

## 2019-04-29 ENCOUNTER — Other Ambulatory Visit: Payer: Self-pay

## 2019-04-29 ENCOUNTER — Ambulatory Visit (INDEPENDENT_AMBULATORY_CARE_PROVIDER_SITE_OTHER): Payer: BLUE CROSS/BLUE SHIELD | Admitting: Cardiology

## 2019-04-29 VITALS — BP 168/102 | HR 72 | Ht 63.0 in | Wt 235.1 lb

## 2019-04-29 DIAGNOSIS — I10 Essential (primary) hypertension: Secondary | ICD-10-CM | POA: Diagnosis not present

## 2019-04-29 DIAGNOSIS — R55 Syncope and collapse: Secondary | ICD-10-CM

## 2019-04-29 NOTE — Patient Instructions (Signed)
Medication Instructions:   If you need a refill on your cardiac medications before your next appointment, please call your pharmacy.   Lab work:  If you have labs (blood work) drawn today and your tests are completely normal, you will receive your results only by: . MyChart Message (if you have MyChart) OR . A paper copy in the mail If you have any lab test that is abnormal or we need to change your treatment, we will call you to review the results.  Testing/Procedures: None ordered today.  Follow-Up: At [facility], you and your health needs are our priority.  As part of our continuing mission to provide you with exceptional heart care, we have created designated Provider Care Teams.  These Care Teams include your primary Cardiologist (physician) and Advanced Practice Providers (APPs -  Physician Assistants and Nurse Practitioners) who all work together to provide you with the care you need, when you need it. You will need a follow up appointment in 12 months.  Please call our office 2 months in advance to schedule this appointment.  You may see Dr. Turner or one of the following Advanced Practice Providers on your designated Care Team:   Brittainy Simmons, PA-C Dayna Dunn, PA-C . Michele Lenze, PA-C   

## 2019-04-29 NOTE — Telephone Encounter (Signed)
Called [facility] and spoke with Dr. Elissa Hefty nurse. Dr. Radford Pax wanted him to be aware of patient's elevated BP 168/102. His nurse stated she would let him know.

## 2019-04-29 NOTE — Progress Notes (Signed)
Cardiology Office Note:    Date:  04/29/2019   ID:  [name], DOB 1971-06-23, MRN [id number]  PCP:  [name], NP  Cardiologist:  No primary care provider on file.    Referring MD: [name], NP   Chief Complaint  Patient presents with  . Follow-up    HTN and vasovagal syncope    History of Present Illness:    Janet Sanchez is a 48 y.o. female with a hx of HTN and vasovagal syncope.  She is here today for followup and is doing well.  She denies any chest pain or pressure, SOB, DOE, PND, orthopnea, LE edema, dizziness, palpitations or syncope. She is compliant with her meds and is tolerating meds with no SE.    Past Medical History:  Diagnosis Date  . Enlarged tonsils   . Herpes   . Hypertension   . Migraines   . Morbid obesity ([place])   . Vasovagal syncope 10/24/2017    Past Surgical History:  Procedure Laterality Date  . TUBAL LIGATION      Current Medications: Current Meds  Medication Sig  . acyclovir (ZOVIRAX) 400 MG tablet Take 1 tablet (400 mg total) by mouth 2 (two) times daily.  Marland Kitchen albuterol (PROVENTIL HFA;VENTOLIN HFA) 108 (90 Base) MCG/ACT inhaler Inhale 2 puffs into the lungs every 6 (six) hours as needed for wheezing or shortness of breath.  Marland Kitchen aspirin EC 81 MG tablet Take 81 mg by mouth daily.  Marland Kitchen gabapentin (NEURONTIN) 100 MG capsule Take 2 capsules (200 mg total) by mouth at bedtime.  . irbesartan (AVAPRO) 150 MG tablet TAKE 1 TABLET BY MOUTH EVERY DAY  . omeprazole (PRILOSEC) 20 MG capsule TAKE 1 CAPSULE BY MOUTH EVERY DAY     Allergies:   Potassium-containing compounds, Hctz [hydrochlorothiazide], and Lisinopril   Social History   Socioeconomic History  . Marital status: Married    Spouse name: Not on file  . Number of children: Not on file  . Years of education: Not on file  . Highest education level: Not on file  Occupational History  . Not on file  Social Needs  . Financial resource strain: Not on file  . Food  insecurity    Worry: Not on file    Inability: Not on file  . Transportation needs    Medical: Not on file    Non-medical: Not on file  Tobacco Use  . Smoking status: Never Smoker  . Smokeless tobacco: Never Used  Substance and Sexual Activity  . Alcohol use: No  . Drug use: No  . Sexual activity: Yes    Birth control/protection: Surgical  Lifestyle  . Physical activity    Days per week: Not on file    Minutes per session: Not on file  . Stress: Not on file  Relationships  . Social Herbalist on phone: Not on file    Gets together: Not on file    Attends religious service: Not on file    Active member of club or organization: Not on file    Attends meetings of clubs or organizations: Not on file    Relationship status: Not on file  Other Topics Concern  . 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  ROS:   Please see the history of present illness.    ROS  All other systems reviewed and negative.   EKGs/Labs/Other Studies Reviewed:    The following studies were reviewed today: none  EKG:  EKG is not ordered today.    Recent Labs: 12/19/2018: ALT 24; TSH 1.89 03/10/2019: B Natriuretic Peptide 65.0; BUN 13; Creatinine, Ser 1.21; Hemoglobin 11.8; Platelets 277; Potassium 3.1; Sodium 136   Recent Lipid Panel    Component Value Date/Time   CHOL 206 (H) 12/19/2018 0943   TRIG 70.0 12/19/2018 0943   HDL 70.20 12/19/2018 0943   CHOLHDL 3 12/19/2018 0943   VLDL 14.0 12/19/2018 0943   LDLCALC 122 (H) 12/19/2018 0943    Physical Exam:    VS:  BP (!) 168/102   Pulse 72   Ht 5\' 3"  (1.6 m)   Wt 235 lb 1.9 oz (106.6 kg)   SpO2 99%    BMI 41.65 kg/m     Wt Readings from Last 3 Encounters:  04/29/19 235 lb 1.9 oz (106.6 kg)  04/07/19 235 lb (106.6 kg)  03/13/19 236 lb (107 kg)     GEN:  Well nourished, well developed in no acute distress HEENT: Normal NECK: No JVD; No carotid bruits LYMPHATICS: No lymphadenopathy CARDIAC: RRR, no murmurs, rubs, gallops RESPIRATORY:  Clear to auscultation without rales, wheezing or rhonchi  ABDOMEN: Soft, non-tender, non-distended MUSCULOSKELETAL:  No edema; No deformity  SKIN: Warm and dry NEUROLOGIC:  Alert and oriented x 3 PSYCHIATRIC:  Normal affect   ASSESSMENT:    1. Essential hypertension   2. Vasovagal syncope   3. Morbid obesity ([place])    PLAN:    In order of problems listed above:  1.  HTN -BP poorly controlled on exam -continue Irbesartan to 150mg  BID -creatinine 1.2 on 03/10/2019 -she is followed by Nephrology who is managing her HTN.  I have asked her to call them today to let them know her BP is elevated.  She has multiple BP med intolerances.  2.  Vasovagal syncope -she has not had any dizziness or syncope  3.  Morbid Obesity -I have encouraged her to get into a routine exercise program and cut back on carbs and portions.    Medication Adjustments/Labs and Tests Ordered: Current medicines are reviewed at length with the patient today.  Concerns regarding medicines are outlined above.  No orders of the defined types were placed in this encounter.  No orders of the defined types were placed in this encounter.   Signed, Fransico Him, MD  04/29/2019 11:36 AM    [place]

## 2019-05-05 ENCOUNTER — Other Ambulatory Visit: Payer: Self-pay | Admitting: Nephrology

## 2019-05-05 DIAGNOSIS — I10 Essential (primary) hypertension: Secondary | ICD-10-CM

## 2019-06-04 ENCOUNTER — Ambulatory Visit
Admission: RE | Admit: 2019-06-04 | Discharge: 2019-06-04 | Disposition: A | Discharge status: Home / Self Care | Payer: BLUE CROSS/BLUE SHIELD | Source: Ambulatory Visit | Attending: Nephrology | Admitting: Nephrology

## 2019-06-04 DIAGNOSIS — I10 Essential (primary) hypertension: Secondary | ICD-10-CM

## 2019-06-16 ENCOUNTER — Other Ambulatory Visit: Payer: Self-pay

## 2019-06-16 DIAGNOSIS — Z20822 Contact with and (suspected) exposure to covid-19: Secondary | ICD-10-CM

## 2019-06-18 LAB — NOVEL CORONAVIRUS, NAA: SARS-CoV-2, NAA: NOT DETECTED

## 2019-09-30 ENCOUNTER — Encounter: Payer: Self-pay | Admitting: Adult Health

## 2019-09-30 ENCOUNTER — Other Ambulatory Visit: Payer: Self-pay

## 2019-09-30 ENCOUNTER — Telehealth (INDEPENDENT_AMBULATORY_CARE_PROVIDER_SITE_OTHER): Payer: 59 | Admitting: Adult Health

## 2019-09-30 ENCOUNTER — Other Ambulatory Visit: Payer: Self-pay | Admitting: Adult Health

## 2019-09-30 DIAGNOSIS — L304 Erythema intertrigo: Secondary | ICD-10-CM

## 2019-09-30 DIAGNOSIS — Z1231 Encounter for screening mammogram for malignant neoplasm of breast: Secondary | ICD-10-CM

## 2019-09-30 NOTE — Progress Notes (Signed)
Virtual Visit via Telephone Note  I connected with Janet Sanchez on 09/30/19 at  4:00 PM EST by telephone and verified that I am speaking with the correct person using two identifiers.   I discussed the limitations, risks, security and privacy concerns of performing an evaluation and management service by telephone and the availability of in person appointments. I also discussed with the patient that there may be a patient responsible charge related to this service. The patient expressed understanding and agreed to proceed.  Location patient: home Location provider: work or home office Participants present for the call: patient, provider Patient did not have a visit in the prior 7 days to address this/these issue(s).   History of Present Illness: 49 year old female who is being evaluated today for "Red irritated rash under my left breast and on the side of my left breast.  Rash has been present for approximately 1 week.  She reports itching and irritation.  She has not noticed any vesicles or discharge from the rash   Observations/Objective: Patient sounds cheerful and well on the phone. I do not appreciate any SOB. Speech and thought processing are grossly intact. Patient reported vitals:  Assessment and Plan: 1. Intertrigo - She was able to send a picture to her mychart of the rash. This was reviewed by me -Advised to mix a small amount of over-the-counter hydrocortisone cream and antifungal and apply twice a day.  She was advised to make sure the area is clean and dry, okay to use moisture absorbing powders, and sleep without a bra.  Advise follow-up if no improvement over the next 7 to 10 days or sooner if rash spreads   Follow Up Instructions:   I did not refer this patient for an OV in the next 24 hours for this/these issue(s).  I discussed the assessment and treatment plan with the patient. The patient was provided an opportunity to ask questions and all were answered.  The patient agreed with the plan and demonstrated an understanding of the instructions.   The patient was advised to call back or seek an in-person evaluation if the symptoms worsen or if the condition fails to improve as anticipated.  I provided 20 minutes of non-face-to-face time during this encounter.   [name], NP

## 2019-09-30 NOTE — Telephone Encounter (Signed)
Reviewed by Tommi Rumps.  Nothing further needed.

## 2019-10-06 ENCOUNTER — Encounter: Payer: Self-pay | Admitting: Adult Health

## 2019-10-13 ENCOUNTER — Other Ambulatory Visit: Payer: Self-pay

## 2019-10-14 ENCOUNTER — Encounter: Payer: Self-pay | Admitting: Adult Health

## 2019-10-14 ENCOUNTER — Ambulatory Visit (INDEPENDENT_AMBULATORY_CARE_PROVIDER_SITE_OTHER): Payer: 59 | Admitting: Adult Health

## 2019-10-14 VITALS — BP 148/90 | Temp 97.6°F | Wt 244.0 lb

## 2019-10-14 DIAGNOSIS — R238 Other skin changes: Secondary | ICD-10-CM

## 2019-10-14 MED ORDER — VALACYCLOVIR HCL 1 G PO TABS: ORAL_TABLET | ORAL | 1 refills | Status: DC

## 2019-10-14 NOTE — Progress Notes (Signed)
Subjective:    Patient ID: Janet Sanchez, female    DOB: [birth year], 49 y.o.   MRN: [id number]  HPI 49 year old female who  has a past medical history of Enlarged tonsils, Herpes, Hypertension, Migraines, Morbid obesity ([place]), and Vasovagal syncope (10/24/2017).  She presents to the office today for rash on her left breast. She was first evaluated on 09/30/2019. At this time the rash had been present for approximately one week. She was advised to use OTC hydrocortisone and antifungal.   Today she reports that the rash continues to be present and continues to itch.   Review of Systems See HPI   Past Medical History:  Diagnosis Date  . Enlarged tonsils   . Herpes   . Hypertension   . Migraines   . Morbid obesity ([place])   . Vasovagal syncope 10/24/2017    Social History   Socioeconomic History  . Marital status: Married    Spouse name: Not on file  . Number of children: Not on file  . Years of education: Not on file  . Highest education level: Not on file  Occupational History  . Not on file  Tobacco Use  . Smoking status: Never Smoker  . Smokeless tobacco: Never Used  Substance and Sexual Activity  . Alcohol use: No  . Drug use: No  . Sexual activity: Yes    Birth control/protection: Surgical  Other Topics Concern  . 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 Difficulty of Paying Living Expenses:   Food Insecurity:   . Worried About Charity fundraiser in the Last Year:   . Arboriculturist in the Last Year:   Transportation Needs:   . Film/video editor (Medical):   Marland Kitchen Lack of Transportation (Non-Medical):   Physical Activity:   . Days of Exercise per Week:   . Minutes of Exercise per Session:   Stress:   . Feeling of Stress :   Social Connections:   . Frequency of Communication with Friends and  Family:   . Frequency of Social Gatherings with Friends and Family:   . Attends Religious Services:   . Active Member of Clubs or Organizations:   . Attends Archivist Meetings:   Marland Kitchen Marital Status:   Intimate Partner Violence:   . Fear of Current or Ex-Partner:   . Emotionally Abused:   Marland Kitchen Physically Abused:   . Sexually Abused:     Past Surgical History:  Procedure Laterality Date  . TUBAL LIGATION      Family History  Problem Relation Age of Onset  . Heart attack Mother 34  . Hypertension Father   . Heart attack Father 81  . Hypertension Sister   . Hypertension Brother   . Arrhythmia Brother   . Hypertension Sister     Allergies  Allergen Reactions  . Potassium-Containing Compounds Other (See Comments)    Severe cramps caused her to pass out.  Marland Kitchen Hctz [Hydrochlorothiazide] Other (See Comments)    Renal impairment   . Lisinopril Other (See Comments)    Headache     Current Outpatient Medications on File Prior to Visit  Medication Sig Dispense Refill  . amLODipine (NORVASC) 10 MG tablet Take 10 mg by mouth daily.    Marland Kitchen  aspirin EC 81 MG tablet Take 81 mg by mouth daily.    Marland Kitchen aspirin-acetaminophen-caffeine (EXCEDRIN MIGRAINE) 250-250-65 MG tablet Take by mouth every 6 (six) hours as needed for headache.    . gabapentin (NEURONTIN) 100 MG capsule Take 2 capsules (200 mg total) by mouth at bedtime. 180 capsule 3  . irbesartan (AVAPRO) 150 MG tablet TAKE 1 TABLET BY MOUTH EVERY DAY 30 tablet 1  . omeprazole (PRILOSEC) 20 MG capsule TAKE 1 CAPSULE BY MOUTH EVERY DAY 90 capsule 1   No current facility-administered medications on file prior to visit.    BP (!) 148/90   Temp 97.6 F (36.4 C)   Wt 244 lb (110.7 kg)   BMI 43.22 kg/m       Objective:   Physical Exam Vitals and nursing note reviewed.  Musculoskeletal:        General: Normal range of motion.  Skin:    General: Skin is warm and dry.     Findings: Rash present.     Comments: Small clustered  area of vesicles noted on left medial breast. No active drainage   Neurological:     General: No focal deficit present.     Mental Status: She is oriented to person, place, and time.       Assessment & Plan:  1. Vesicular rash - Appears as HSV rash, which she has a history of. She has Acyclovir at home but has not taken it due to it " causing my mouth to be dry". Will send in Valtrex.  - valACYclovir (VALTREX) 1000 MG tablet; 1000 mg BID x 10 days PRN for flare  Dispense: 90 tablet; Refill: 1 - Follow up if not cleared in the next 10 days   [name], NP

## 2019-10-24 ENCOUNTER — Ambulatory Visit
Admission: RE | Admit: 2019-10-24 | Discharge: 2019-10-24 | Disposition: A | Discharge status: Home / Self Care | Payer: 59 | Source: Ambulatory Visit | Attending: Adult Health | Admitting: Adult Health

## 2019-10-24 ENCOUNTER — Other Ambulatory Visit: Payer: Self-pay

## 2019-10-24 DIAGNOSIS — Z1231 Encounter for screening mammogram for malignant neoplasm of breast: Secondary | ICD-10-CM

## 2019-10-27 ENCOUNTER — Other Ambulatory Visit: Payer: Self-pay | Admitting: Adult Health

## 2019-10-27 DIAGNOSIS — R928 Other abnormal and inconclusive findings on diagnostic imaging of breast: Secondary | ICD-10-CM

## 2019-10-28 ENCOUNTER — Other Ambulatory Visit: Payer: Self-pay | Admitting: Adult Health

## 2019-10-28 ENCOUNTER — Encounter: Payer: Self-pay | Admitting: Adult Health

## 2019-10-28 DIAGNOSIS — I10 Essential (primary) hypertension: Secondary | ICD-10-CM

## 2019-10-29 NOTE — Telephone Encounter (Signed)
Medication sent to the pharmacy.  I scheduled the pt for her cpx.

## 2019-11-07 ENCOUNTER — Encounter: Payer: Self-pay | Admitting: Adult Health

## 2019-11-10 ENCOUNTER — Other Ambulatory Visit: Payer: Self-pay

## 2019-11-10 ENCOUNTER — Other Ambulatory Visit: Payer: Self-pay | Admitting: Adult Health

## 2019-11-10 ENCOUNTER — Ambulatory Visit
Admission: RE | Admit: 2019-11-10 | Discharge: 2019-11-10 | Disposition: A | Discharge status: Home / Self Care | Payer: 59 | Source: Ambulatory Visit | Attending: Adult Health | Admitting: Adult Health

## 2019-11-10 DIAGNOSIS — R928 Other abnormal and inconclusive findings on diagnostic imaging of breast: Secondary | ICD-10-CM

## 2019-11-24 LAB — BASIC METABOLIC PANEL
BUN: 18 (ref 4–21)
CO2: 24 — AB (ref 13–22)
Chloride: 101 (ref 99–108)
Creatinine: 1.1 (ref 0.5–1.1)
Glucose: 82
Potassium: 3.8 (ref 3.4–5.3)
Sodium: 138 (ref 137–147)

## 2019-11-24 LAB — COMPREHENSIVE METABOLIC PANEL: Albumin: 4.6 (ref 3.5–5.0)

## 2019-12-03 ENCOUNTER — Encounter: Payer: Self-pay | Admitting: Family Medicine

## 2019-12-24 ENCOUNTER — Other Ambulatory Visit: Payer: Self-pay

## 2019-12-24 ENCOUNTER — Ambulatory Visit (HOSPITAL_COMMUNITY)
Admission: EM | Admit: 2019-12-24 | Discharge: 2019-12-24 | Disposition: A | Discharge status: Home / Self Care | Payer: 59 | Attending: Internal Medicine | Admitting: Internal Medicine

## 2019-12-24 ENCOUNTER — Encounter (HOSPITAL_COMMUNITY): Payer: Self-pay | Admitting: Emergency Medicine

## 2019-12-24 DIAGNOSIS — G43909 Migraine, unspecified, not intractable, without status migrainosus: Secondary | ICD-10-CM | POA: Diagnosis not present

## 2019-12-24 DIAGNOSIS — Z20822 Contact with and (suspected) exposure to covid-19: Secondary | ICD-10-CM | POA: Diagnosis not present

## 2019-12-24 DIAGNOSIS — Z8249 Family history of ischemic heart disease and other diseases of the circulatory system: Secondary | ICD-10-CM | POA: Insufficient documentation

## 2019-12-24 DIAGNOSIS — J069 Acute upper respiratory infection, unspecified: Secondary | ICD-10-CM

## 2019-12-24 DIAGNOSIS — I10 Essential (primary) hypertension: Secondary | ICD-10-CM | POA: Diagnosis not present

## 2019-12-24 DIAGNOSIS — J351 Hypertrophy of tonsils: Secondary | ICD-10-CM | POA: Diagnosis not present

## 2019-12-24 DIAGNOSIS — Z79899 Other long term (current) drug therapy: Secondary | ICD-10-CM | POA: Diagnosis not present

## 2019-12-24 DIAGNOSIS — K219 Gastro-esophageal reflux disease without esophagitis: Secondary | ICD-10-CM | POA: Insufficient documentation

## 2019-12-24 DIAGNOSIS — Z86018 Personal history of other benign neoplasm: Secondary | ICD-10-CM | POA: Diagnosis not present

## 2019-12-24 MED ORDER — FLUTICASONE PROPIONATE 50 MCG/ACT NA SUSP
1.0000 | Freq: Every day | NASAL | 0 refills | Status: DC
Start: 2019-12-24 — End: 2020-11-02

## 2019-12-24 NOTE — ED Triage Notes (Signed)
Patient reports symptoms 18 days ago, 5/14 did a rapid test for covid and was negative. 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  Coughing started yesterday evening.  Body aches, watery eyes.  Patient denies fever. Patient denies chills.   Patient did not receive covid vaccine

## 2019-12-24 NOTE — ED Notes (Signed)
covid sample obtained, labeled, verified name and placed in lab

## 2019-12-24 NOTE — ED Provider Notes (Signed)
[place]    CSN: [id number] Arrival date & time: 12/24/19  0808      History   Chief Complaint Chief Complaint  Patient presents with  . URI    HPI Janet Sanchez is a 49 y.o. female comes to the urgent care with 18-day history of nasal congestion, clear rhinorrhea and a cough.  Patient symptoms started 18 days ago and has been persistent.  No fever or chills.  She had a negative rapid Covid test couple weeks ago.  Patient denies any loss of taste or smell.  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  No Covid outbreak in the daycare.  Patient has not been vaccinated against COVID-19 as of yet.Marland Kitchen   HPI  Past Medical History:  Diagnosis Date  . Enlarged tonsils   . Herpes   . Hypertension   . Migraines   . Morbid obesity ([place])   . Vasovagal syncope 10/24/2017    Patient Active Problem List   Diagnosis Date Noted  . Bilateral carpal tunnel syndrome 04/07/2019  . Vasovagal syncope 10/24/2017  . GERD (gastroesophageal reflux disease) 07/10/2017  . Morbid obesity ([place]) 11/19/2013  . DUB (dysfunctional uterine bleeding) 05/08/2013  . Fibroid uterus 03/01/2011  . Herpetic vulvovaginitis 04/30/2007  . HSV 04/30/2007  . CARPAL TUNNEL SYNDROME, RIGHT 04/30/2007  . Essential hypertension 04/30/2007  . HYPERTROPHY, TONSILS 04/30/2007  . LOW BACK PAIN SYNDROME 04/30/2007    Past Surgical History:  Procedure Laterality Date  . TUBAL LIGATION      OB History    Gravida  10   Para  5   Term  4   Preterm  1   AB  5   Living  5     SAB  5   TAB      Ectopic      Multiple      Live Births               Home Medications    Prior to Admission medications   Medication Sig Start Date End Date Taking? Authorizing Provider  amLODipine (NORVASC) 10 MG tablet Take 10 mg by mouth daily. 09/22/19  Yes [provider]  aspirin EC 81 MG tablet Take 81 mg by mouth daily.   Yes [provider]  cetirizine (ZYRTEC) 10 MG tablet Take 10 mg by mouth daily.   Yes [provider]  irbesartan (AVAPRO) 150 MG tablet TAKE 1 TABLET BY MOUTH EVERY DAY Patient taking differently: Take 1.5 tablets in the am and 1 whole tab at bedtime. 10/29/19  Yes Nafziger, Tommi Rumps, NP  Multiple Vitamin (MULTIVITAMIN) tablet Take 1 tablet by mouth daily.   Yes [provider]  omeprazole (PRILOSEC) 20 MG capsule TAKE 1 CAPSULE BY MOUTH EVERY DAY 10/29/19  Yes Nafziger, Tommi Rumps, NP  aspirin-acetaminophen-caffeine (EXCEDRIN MIGRAINE) [phone] MG tablet Take by mouth every 6 (six) hours as needed for headache.    [provider]  fluticasone (FLONASE) 50 MCG/ACT nasal spray Place 1 spray into both nostrils daily. 12/24/19   Darean Rote, Myrene Galas, MD  gabapentin (NEURONTIN) 100 MG capsule Take 2 capsules (200 mg total) by mouth at bedtime. 02/13/19   Lyndal Pulley, DO  valACYclovir (VALTREX) 1000 MG tablet 1000 mg BID x 10 days PRN for flare 10/14/19   [name], NP    Family History Family History  Problem Relation Age of Onset  . Heart attack Mother  76  . Hypertension Father   . Heart attack Father 20  . Hypertension Sister   . Hypertension Brother   . Arrhythmia Brother   . Hypertension Sister     Social History Social History   Tobacco Use  . Smoking status: Never Smoker  . Smokeless tobacco: Never Used  Substance Use Topics  . Alcohol use: No  . Drug use: No     Allergies   Potassium-containing compounds, Hctz [hydrochlorothiazide], and Lisinopril   Review of Systems Review of Systems  Constitutional: Negative.   HENT: Positive for congestion and rhinorrhea. Negative for sore throat and voice change.   Respiratory: Positive for cough. Negative for chest tightness and shortness of breath.   Cardiovascular: Negative.   Gastrointestinal: Negative.   Genitourinary: Negative.      Physical Exam Triage Vital Signs ED Triage Vitals  Enc Vitals Group      BP 12/24/19 0843 (!) 163/98     Pulse Rate 12/24/19 0843 80     Resp 12/24/19 0843 16     Temp 12/24/19 0843 98.3 F (36.8 C)     Temp Source 12/24/19 0843 Oral     SpO2 12/24/19 0843 100 %     Weight --      Height --      Head Circumference --      [place] --      Pain Score 12/24/19 0847 5     Pain Loc --      Pain Edu? --      Excl. in [place]? --    No data found.  Updated Vital Signs BP (!) 163/98 (BP Location: Left Arm)   Pulse 80   Temp 98.3 F (36.8 C) (Oral)   Resp 16   LMP 12/16/2019   SpO2 100%   Visual Acuity Right Eye Distance:   Left Eye Distance:   Bilateral Distance:    Right Eye Near:   Left Eye Near:    Bilateral Near:     Physical Exam Vitals and nursing note reviewed.  Constitutional:      General: She is not in acute distress.    Appearance: She is not ill-appearing.  HENT:     Right Ear: Tympanic membrane normal.     Left Ear: Tympanic membrane normal.     Nose: Congestion and rhinorrhea present.     Mouth/Throat:     Mouth: Mucous membranes are moist.     Pharynx: No oropharyngeal exudate or posterior oropharyngeal erythema.  Eyes:     Extraocular Movements: Extraocular movements intact.     Conjunctiva/sclera: Conjunctivae normal.  Cardiovascular:     Rate and Rhythm: Normal rate and regular rhythm.     Pulses: Normal pulses.     Heart sounds: Normal heart sounds.  Pulmonary:     Effort: Pulmonary effort is normal.     Breath sounds: Normal breath sounds.  Skin:    Capillary Refill: Capillary refill takes less than 2 seconds.  Neurological:     Mental Status: She is alert.      UC Treatments / Results  Labs (all labs ordered are listed, but only abnormal results are displayed) Labs Reviewed  SARS CORONAVIRUS 2 (TAT 6-24 HRS)    EKG   Radiology No results found.  Procedures Procedures (including critical care time)  Medications Ordered in UC Medications - No data to display  Initial Impression / Assessment and  Plan / UC Course  I have reviewed the triage  vital signs and the nursing notes.  Pertinent labs & imaging results that were available during my care of the patient were reviewed by me and considered in my medical decision making (see chart for details).     1.  Viral URI with cough: COVID-19 PCR test sent Patient is encouraged to consider getting the COVID-19 vaccination Return precautions given Fluticasone nasal spray Patient is advised to continue taking Zyrtec as well as Mucinex. Final Clinical Impressions(s) / UC Diagnoses   Final diagnoses:  Viral URI with cough   Discharge Instructions   None    ED Prescriptions    Medication Sig Dispense Auth. Provider   fluticasone (FLONASE) 50 MCG/ACT nasal spray Place 1 spray into both nostrils daily. 16 g Chelcie Estorga, Myrene Galas, MD     PDMP not reviewed this encounter.   Chase Picket, MD 12/24/19 1113

## 2019-12-25 LAB — SARS CORONAVIRUS 2 (TAT 6-24 HRS): SARS Coronavirus 2: NEGATIVE

## 2019-12-30 ENCOUNTER — Other Ambulatory Visit: Payer: Self-pay

## 2019-12-30 ENCOUNTER — Other Ambulatory Visit (HOSPITAL_COMMUNITY)
Admission: RE | Admit: 2019-12-30 | Discharge: 2019-12-30 | Disposition: A | Discharge status: Home / Self Care | Payer: 59 | Source: Ambulatory Visit | Attending: Adult Health | Admitting: Adult Health

## 2019-12-30 ENCOUNTER — Encounter: Payer: Self-pay | Admitting: Adult Health

## 2019-12-30 ENCOUNTER — Ambulatory Visit (INDEPENDENT_AMBULATORY_CARE_PROVIDER_SITE_OTHER): Payer: 59 | Admitting: Adult Health

## 2019-12-30 VITALS — BP 150/94 | Temp 98.0°F | Ht 63.0 in | Wt 247.0 lb

## 2019-12-30 DIAGNOSIS — K2100 Gastro-esophageal reflux disease with esophagitis, without bleeding: Secondary | ICD-10-CM | POA: Diagnosis not present

## 2019-12-30 DIAGNOSIS — Z01419 Encounter for gynecological examination (general) (routine) without abnormal findings: Secondary | ICD-10-CM

## 2019-12-30 DIAGNOSIS — Z0000 Encounter for general adult medical examination without abnormal findings: Secondary | ICD-10-CM | POA: Diagnosis present

## 2019-12-30 DIAGNOSIS — I10 Essential (primary) hypertension: Secondary | ICD-10-CM | POA: Diagnosis not present

## 2019-12-30 NOTE — Patient Instructions (Signed)
It was great seeing you today   Please go to the [place] office for your labs   We will follow up with you regarding your blood work and pap results.   Continue to work on weight loss and drink plenty of water

## 2019-12-30 NOTE — Progress Notes (Signed)
Subjective:    Patient ID: Janet Sanchez, female    DOB: [birth year], 49 y.o.   MRN: [id number]  HPI  Patient presents for yearly preventative medicine examination. She is a pleasant 49 year old female who  has a past medical history of Enlarged tonsils, Herpes, Hypertension, Migraines, Morbid obesity ([place]), and Vasovagal syncope (10/24/2017).  HTN -is managed by nephrology.  BP is poorly controlled in the past and has multiple BP medication intolerances.  Currently on Avapro 300 mg daily and Norvasc 10 mg daily.Avapro was increased from 150 to 300 about two days ago.   She denies dizziness, lightheadedness, chest pain, or shortness of breath BP Readings from Last 3 Encounters:  12/30/19 (!) 150/94  12/24/19 (!) 163/98  10/14/19 (!) 148/90   GERD - takes Prilosec 20 mg daily.   All immunizations and health maintenance protocols were reviewed with the patient and needed orders were placed. She has not been vaccinated yet against COVID 19  Appropriate screening laboratory values were ordered for the patient including screening of hyperlipidemia, renal function and hepatic function.  Medication reconciliation,  past medical history, social history, problem list and allergies were reviewed in detail with the patient  Goals were established with regard to weight loss, exercise, and  diet in compliance with medications. She has been walking a few times a week and has cut back on fast foods.   Wt Readings from Last 3 Encounters:  12/30/19 247 lb (112 kg)  10/14/19 244 lb (110.7 kg)  04/29/19 235 lb 1.9 oz (106.6 kg)   She is up to date on mammogram but is due for a pap. She does not have GYN   Review of Systems  Constitutional: Negative.   HENT: Negative.   Eyes: Negative.   Respiratory: Negative.   Cardiovascular: Negative.   Gastrointestinal: Negative.   Endocrine: Negative.   Genitourinary: Negative.   Musculoskeletal: Negative.   Skin: Negative.   Allergic/Immunologic:  Negative.   Neurological: Negative.   Hematological: Negative.   Psychiatric/Behavioral: Negative.    Past Medical History:  Diagnosis Date  . Enlarged tonsils   . Herpes   . Hypertension   . Migraines   . Morbid obesity ([place])   . Vasovagal syncope 10/24/2017    Social History   Socioeconomic History  . Marital status: Married    Spouse name: Not on file  . Number of children: Not on file  . Years of education: Not on file  . Highest education level: Not on file  Occupational History  . Not on file  Tobacco Use  . Smoking status: Never Smoker  . Smokeless tobacco: Never Used  Substance and Sexual Activity  . Alcohol use: No  . Drug use: No  . Sexual activity: Yes    Birth control/protection: Surgical  Other Topics Concern  . 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 Difficulty of Paying Living Expenses:   Food Insecurity:   . Worried About Charity fundraiser in the Last Year:   . Arboriculturist in the Last Year:   Transportation Needs:   . Film/video editor (Medical):   Marland Kitchen Lack of Transportation (Non-Medical):   Physical Activity:   . Days of Exercise per Week:   . Minutes of  Exercise per Session:   Stress:   . Feeling of Stress :   Social Connections:   . Frequency of Communication with Friends and Family:   . Frequency of Social Gatherings with Friends and Family:   . Attends Religious Services:   . Active Member of Clubs or Organizations:   . Attends Archivist Meetings:   Marland Kitchen Marital Status:   Intimate Partner Violence:   . Fear of Current or Ex-Partner:   . Emotionally Abused:   Marland Kitchen Physically Abused:   . Sexually Abused:     Past Surgical History:  Procedure Laterality Date  . TUBAL LIGATION      Family History  Problem Relation Age of Onset  . Heart attack Mother 6  .  Hypertension Father   . Heart attack Father 74  . Hypertension Sister   . Hypertension Brother   . Arrhythmia Brother   . Hypertension Sister     Allergies  Allergen Reactions  . Potassium-Containing Compounds Other (See Comments)    Severe cramps caused her to pass out.  Marland Kitchen Hctz [Hydrochlorothiazide] Other (See Comments)    Renal impairment   . Lisinopril Other (See Comments)    Headache     Current Outpatient Medications on File Prior to Visit  Medication Sig Dispense Refill  . amLODipine (NORVASC) 10 MG tablet Take 10 mg by mouth daily.    Marland Kitchen aspirin EC 81 MG tablet Take 81 mg by mouth daily.    Marland Kitchen aspirin-acetaminophen-caffeine (EXCEDRIN MIGRAINE) 250-250-65 MG tablet Take by mouth every 6 (six) hours as needed for headache.    . cetirizine (ZYRTEC) 10 MG tablet Take 10 mg by mouth daily.    . fluticasone (FLONASE) 50 MCG/ACT nasal spray Place 1 spray into both nostrils daily. 16 g 0  . irbesartan (AVAPRO) 300 MG tablet Take 300 mg by mouth daily.    . Multiple Vitamin (MULTIVITAMIN) tablet Take 1 tablet by mouth daily.    Marland Kitchen omeprazole (PRILOSEC) 20 MG capsule TAKE 1 CAPSULE BY MOUTH EVERY DAY 90 capsule 0  . valACYclovir (VALTREX) 1000 MG tablet 1000 mg BID x 10 days PRN for flare 90 tablet 1  . gabapentin (NEURONTIN) 100 MG capsule Take 2 capsules (200 mg total) by mouth at bedtime. (Patient not taking: Reported on 12/30/2019) 180 capsule 3   No current facility-administered medications on file prior to visit.    BP (!) 150/94   Temp 98 F (36.7 C)   Ht 5\' 3"  (1.6 m)   Wt 247 lb (112 kg)   LMP 12/16/2019   BMI 43.75 kg/m       Objective:   Physical Exam Vitals and nursing note reviewed. Exam conducted with a chaperone present.  Constitutional:      General: She is not in acute distress.    Appearance: Normal appearance. She is well-developed. She is obese. She is not ill-appearing.  HENT:     Head: Normocephalic and atraumatic.     Right Ear: Tympanic membrane,  ear canal and external ear normal. There is no impacted cerumen.     Left Ear: Tympanic membrane, ear canal and external ear normal. There is no impacted cerumen.     Nose: Nose normal. No congestion or rhinorrhea.     Mouth/Throat:     Mouth: Mucous membranes are moist.     Pharynx: Oropharynx is clear. No oropharyngeal exudate or posterior oropharyngeal erythema.  Eyes:     General:  Right eye: No discharge.        Left eye: No discharge.     Extraocular Movements: Extraocular movements intact.     Conjunctiva/sclera: Conjunctivae normal.     Pupils: Pupils are equal, round, and reactive to light.  Neck:     Thyroid: No thyromegaly.     Vascular: No carotid bruit.     Trachea: No tracheal deviation.  Cardiovascular:     Rate and Rhythm: Normal rate and regular rhythm.     Pulses: Normal pulses.     Heart sounds: Normal heart sounds. No murmur. No friction rub. No gallop.   Pulmonary:     Effort: Pulmonary effort is normal. No respiratory distress.     Breath sounds: Normal breath sounds. No stridor. No wheezing, rhonchi or rales.  Chest:     Chest wall: No tenderness.  Abdominal:     General: Abdomen is flat. Bowel sounds are normal. There is no distension.     Palpations: Abdomen is soft. There is no mass.     Tenderness: There is no abdominal tenderness. There is no right CVA tenderness, left CVA tenderness, guarding or rebound.     Hernia: No hernia is present.  Genitourinary:    Vagina: Normal.     Cervix: Normal.     Uterus: Normal.      Adnexa: Right adnexa normal and left adnexa normal.  Musculoskeletal:        General: No swelling, tenderness, deformity or signs of injury. Normal range of motion.     Cervical back: Normal range of motion and neck supple.     Right lower leg: No edema.     Left lower leg: No edema.  Lymphadenopathy:     Cervical: No cervical adenopathy.  Skin:    General: Skin is warm and dry.     Capillary Refill: Capillary refill takes  less than 2 seconds.     Coloration: Skin is not jaundiced or pale.     Findings: No bruising, erythema, lesion or rash.  Neurological:     General: No focal deficit present.     Mental Status: She is alert and oriented to person, place, and time.     Cranial Nerves: No cranial nerve deficit.     Sensory: No sensory deficit.     Motor: No weakness.     Coordination: Coordination normal.     Gait: Gait normal.     Deep Tendon Reflexes: Reflexes normal.  Psychiatric:        Mood and Affect: Mood normal.        Behavior: Behavior normal.        Thought Content: Thought content normal.        Judgment: Judgment normal.       Assessment & Plan:  1. Routine general medical examination at a health care facility -Encouraged to continue with weight loss through diet and exercise. -Follow-up in 1 year for next CPE or sooner if needed - CBC with Differential/Platelet; Future - Comprehensive metabolic panel; Future - Hemoglobin A1c; Future - Lipid panel; Future - TSH; Future - PAP [[place]]  2. Essential hypertension - Not at goal today. Follow up with Nephrology as directed - CBC with Differential/Platelet; Future - Comprehensive metabolic panel; Future - Hemoglobin A1c; Future - Lipid panel; Future - TSH; Future  3. Gastroesophageal reflux disease with esophagitis without hemorrhage - Continue with PPI   4. Morbid obesity ([place]) - Encouraged to continue to walk and work  on eliminating all fast food and eat Mediterranean diet. - CBC with Differential/Platelet; Future - Comprehensive metabolic panel; Future - Hemoglobin A1c; Future - Lipid panel; Future - TSH; Future  5. Encounter for annual routine gynecological examination  - PAP [[place]]  [name], NP

## 2019-12-31 ENCOUNTER — Other Ambulatory Visit (INDEPENDENT_AMBULATORY_CARE_PROVIDER_SITE_OTHER): Payer: 59

## 2019-12-31 DIAGNOSIS — Z0000 Encounter for general adult medical examination without abnormal findings: Secondary | ICD-10-CM

## 2019-12-31 DIAGNOSIS — I10 Essential (primary) hypertension: Secondary | ICD-10-CM

## 2019-12-31 LAB — COMPREHENSIVE METABOLIC PANEL
ALT: 22 U/L (ref 0–35)
AST: 31 U/L (ref 0–37)
Albumin: 4.1 g/dL (ref 3.5–5.2)
Alkaline Phosphatase: 78 U/L (ref 39–117)
BUN: 14 mg/dL (ref 6–23)
CO2: 30 mEq/L (ref 19–32)
Calcium: 9.4 mg/dL (ref 8.4–10.5)
Chloride: 100 mEq/L (ref 96–112)
Creatinine, Ser: 0.99 mg/dL (ref 0.40–1.20)
GFR: 72.17 mL/min (ref >60.00)
Glucose, Bld: 81 mg/dL (ref 70–99)
Potassium: 3.8 mEq/L (ref 3.5–5.1)
Sodium: 136 mEq/L (ref 135–145)
Total Bilirubin: 0.7 mg/dL (ref 0.2–1.2)
Total Protein: 8 g/dL (ref 6.0–8.3)

## 2019-12-31 LAB — LIPID PANEL
Cholesterol: 194 mg/dL (ref 0–200)
HDL: 62.2 mg/dL (ref >39.00)
LDL Cholesterol: 118 mg/dL — ABNORMAL HIGH (ref 0–99)
NonHDL: 132.11
Total CHOL/HDL Ratio: 3
Triglycerides: 70 mg/dL (ref 0.0–149.0)
VLDL: 14 mg/dL (ref 0.0–40.0)

## 2019-12-31 LAB — CBC WITH DIFFERENTIAL/PLATELET
Basophils Absolute: 0 10*3/uL (ref 0.0–0.1)
Basophils Relative: 0.6 % (ref 0.0–3.0)
Eosinophils Absolute: 0.1 10*3/uL (ref 0.0–0.7)
Eosinophils Relative: 2.1 % (ref 0.0–5.0)
HCT: 34.3 % — ABNORMAL LOW (ref 36.0–46.0)
Hemoglobin: 11.4 g/dL — ABNORMAL LOW (ref 12.0–15.0)
Lymphocytes Relative: 38.1 % (ref 12.0–46.0)
Lymphs Abs: 2 10*3/uL (ref 0.7–4.0)
MCHC: 33.2 g/dL (ref 30.0–36.0)
MCV: 86.1 fl (ref 78.0–100.0)
Monocytes Absolute: 0.4 10*3/uL (ref 0.1–1.0)
Monocytes Relative: 7.9 % (ref 3.0–12.0)
Neutro Abs: 2.7 10*3/uL (ref 1.4–7.7)
Neutrophils Relative %: 51.3 % (ref 43.0–77.0)
Platelets: 301 10*3/uL (ref 150.0–400.0)
RBC: 3.98 Mil/uL (ref 3.87–5.11)
RDW: 14.8 % (ref 11.5–15.5)
WBC: 5.2 10*3/uL (ref 4.0–10.5)

## 2019-12-31 LAB — HEMOGLOBIN A1C: Hgb A1c MFr Bld: 5.5 % (ref 4.6–6.5)

## 2019-12-31 LAB — TSH: TSH: 3.07 u[IU]/mL (ref 0.35–4.50)

## 2020-01-01 ENCOUNTER — Encounter: Payer: Self-pay | Admitting: Adult Health

## 2020-01-01 LAB — CYTOLOGY - PAP
Comment: NEGATIVE
Diagnosis: NEGATIVE
High risk HPV: NEGATIVE

## 2020-01-05 ENCOUNTER — Other Ambulatory Visit: Payer: Self-pay | Admitting: Adult Health

## 2020-01-05 DIAGNOSIS — R238 Other skin changes: Secondary | ICD-10-CM

## 2020-01-07 NOTE — Telephone Encounter (Signed)
Last picked up on 01/05/20.  Sent in 1 additional refill for the pt to have on file.

## 2020-01-19 ENCOUNTER — Other Ambulatory Visit: Payer: Self-pay | Admitting: Adult Health

## 2020-01-19 DIAGNOSIS — J069 Acute upper respiratory infection, unspecified: Secondary | ICD-10-CM

## 2020-01-20 ENCOUNTER — Encounter: Payer: Self-pay | Admitting: Adult Health

## 2020-01-20 ENCOUNTER — Other Ambulatory Visit: Payer: Self-pay | Admitting: Adult Health

## 2020-01-20 DIAGNOSIS — N924 Excessive bleeding in the premenopausal period: Secondary | ICD-10-CM

## 2020-03-04 ENCOUNTER — Other Ambulatory Visit: Payer: Self-pay | Admitting: Adult Health

## 2020-03-04 NOTE — Telephone Encounter (Signed)
SENT TO THE PHARMACY BY E-SCRIBE. 

## 2020-03-08 ENCOUNTER — Encounter (HOSPITAL_COMMUNITY): Payer: Self-pay

## 2020-03-08 ENCOUNTER — Other Ambulatory Visit: Payer: Self-pay

## 2020-03-08 ENCOUNTER — Ambulatory Visit (HOSPITAL_COMMUNITY)
Admission: EM | Admit: 2020-03-08 | Discharge: 2020-03-08 | Disposition: A | Discharge status: Home / Self Care | Payer: 59 | Attending: Family Medicine | Admitting: Family Medicine

## 2020-03-08 DIAGNOSIS — B349 Viral infection, unspecified: Secondary | ICD-10-CM | POA: Diagnosis not present

## 2020-03-08 DIAGNOSIS — U071 COVID-19: Secondary | ICD-10-CM | POA: Diagnosis not present

## 2020-03-08 DIAGNOSIS — R52 Pain, unspecified: Secondary | ICD-10-CM

## 2020-03-08 DIAGNOSIS — R300 Dysuria: Secondary | ICD-10-CM | POA: Insufficient documentation

## 2020-03-08 DIAGNOSIS — R509 Fever, unspecified: Secondary | ICD-10-CM | POA: Diagnosis not present

## 2020-03-08 DIAGNOSIS — Z20822 Contact with and (suspected) exposure to covid-19: Secondary | ICD-10-CM | POA: Diagnosis present

## 2020-03-08 DIAGNOSIS — Z1152 Encounter for screening for COVID-19: Secondary | ICD-10-CM

## 2020-03-08 LAB — POCT URINALYSIS DIPSTICK, ED / UC
Bilirubin Urine: NEGATIVE
Glucose, UA: NEGATIVE mg/dL
Leukocytes,Ua: NEGATIVE
Nitrite: NEGATIVE
Protein, ur: 30 mg/dL — AB
Specific Gravity, Urine: 1.02 (ref 1.005–1.030)
Urobilinogen, UA: 0.2 mg/dL (ref 0.0–1.0)
pH: 6 (ref 5.0–8.0)

## 2020-03-08 LAB — SARS CORONAVIRUS 2 (TAT 6-24 HRS): SARS Coronavirus 2: POSITIVE — AB

## 2020-03-08 MED ORDER — ACETAMINOPHEN 325 MG PO TABS
650.0000 mg | ORAL_TABLET | Freq: Once | ORAL | Status: AC
  Administered 2020-03-08: 650 mg via ORAL

## 2020-03-08 MED ORDER — ACETAMINOPHEN 325 MG PO TABS
ORAL_TABLET | ORAL | Status: AC
  Filled 2020-03-08: qty 2

## 2020-03-08 NOTE — ED Triage Notes (Addendum)
Pt presents with fever 104.0-102.0 F, body aches, cough, low energy and burning sensation when urinating x 3 days. Pt requested COVID test.

## 2020-03-08 NOTE — ED Provider Notes (Signed)
[place]   [id number] 03/08/20 Arrival Time: 6301   CC: COVID symptoms  SUBJECTIVE: History from: patient.  Janet Sanchez is a 49 y.o. female who presents with abrupt onset of nasal congestion, fever, fatigue, PND, and persistent dry cough for 3 days. Also reports dysuria x 3 days. Denies sick exposure to COVID, flu or strep. Denies recent travel. Has negative history of Covid. Has not completed Covid vaccines. Has not taken OTC medications for this. There are no aggravating or alleviating factors. Denies previous symptoms in the past. Denies sinus pain, rhinorrhea, sore throat, SOB, wheezing, chest pain, nausea, changes in bowel or bladder habits.    ROS: As per HPI.  All other pertinent ROS negative.     Past Medical History:  Diagnosis Date  . Enlarged tonsils   . Herpes   . Hypertension   . Migraines   . Morbid obesity ([place])   . Vasovagal syncope 10/24/2017   Past Surgical History:  Procedure Laterality Date  . TUBAL LIGATION     Allergies  Allergen Reactions  . Potassium-Containing Compounds Other (See Comments)    Severe cramps caused her to pass out.  Marland Kitchen Hctz [Hydrochlorothiazide] Other (See Comments)    Renal impairment   . Lisinopril Other (See Comments)    Headache    No current facility-administered medications on file prior to encounter.   Current Outpatient Medications on File Prior to Encounter  Medication Sig Dispense Refill  . amLODipine (NORVASC) 10 MG tablet Take 10 mg by mouth daily.    Marland Kitchen aspirin EC 81 MG tablet Take 81 mg by mouth daily.    Marland Kitchen aspirin-acetaminophen-caffeine (EXCEDRIN MIGRAINE) 250-250-65 MG tablet Take by mouth every 6 (six) hours as needed for headache.    . cetirizine (ZYRTEC) 10 MG tablet Take 10 mg by mouth daily.    . fluticasone (FLONASE) 50 MCG/ACT nasal spray Place 1 spray into both nostrils daily. 16 g 0  . gabapentin (NEURONTIN) 100 MG capsule Take 2 capsules (200 mg total) by mouth at bedtime. (Patient not  taking: Reported on 12/30/2019) 180 capsule 3  . irbesartan (AVAPRO) 300 MG tablet Take 300 mg by mouth daily.    . Multiple Vitamin (MULTIVITAMIN) tablet Take 1 tablet by mouth daily.    Marland Kitchen omeprazole (PRILOSEC) 20 MG capsule TAKE 1 CAPSULE BY MOUTH EVERY DAY 90 capsule 1  . valACYclovir (VALTREX) 1000 MG tablet take 1 tablet by mouth 2 times daily for 10 days as needed for flare 90 tablet 0  . VENTOLIN HFA 108 (90 Base) MCG/ACT inhaler INHALE 2 PUFFS INTO THE LUNGS EVERY 6 HOURS AS NEEDED FOR FOR WHEEZING OR SHORTNESS OF BREATH 18 g 2   Social History   Socioeconomic History  . Marital status: Married    Spouse name: Not on file  . Number of children: Not on file  . Years of education: Not on file  . Highest education level: Not on file  Occupational History  . Not on file  Tobacco Use  . Smoking status: Never Smoker  . Smokeless tobacco: Never Used  Vaping Use  . Vaping Use: Never used  Substance and Sexual Activity  . Alcohol use: No  . Drug use: No  . Sexual activity: Yes    Birth control/protection: Surgical  Other Topics Concern  . 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 Difficulty of Paying Living Expenses:   Food Insecurity:   . Worried About Charity fundraiser in the Last Year:   . Arboriculturist in the Last Year:   Transportation Needs:   . Film/video editor (Medical):   Marland Kitchen Lack of Transportation (Non-Medical):   Physical Activity:   . Days of Exercise per Week:   . Minutes of Exercise per Session:   Stress:   . Feeling of Stress :   Social Connections:   . Frequency of Communication with Friends and Family:   . Frequency of Social Gatherings with Friends and Family:   . Attends Religious Services:   . Active Member of Clubs or Organizations:   . Attends Archivist Meetings:   Marland Kitchen  Marital Status:   Intimate Partner Violence:   . Fear of Current or Ex-Partner:   . Emotionally Abused:   Marland Kitchen Physically Abused:   . Sexually Abused:    Family History  Problem Relation Age of Onset  . Heart attack Mother 79  . Hypertension Father   . Heart attack Father 41  . Hypertension Sister   . Hypertension Brother   . Arrhythmia Brother   . Hypertension Sister     OBJECTIVE:  Vitals:   03/08/20 0955  BP: 131/88  Pulse: (!) 110  Resp: 20  Temp: (!) 102.3 F (39.1 C)  TempSrc: Oral  SpO2: 98%     General appearance: alert; appears fatigued, but nontoxic; speaking in full sentences and tolerating own secretions HEENT: NCAT; Ears: EACs clear, TMs pearly gray; Eyes: PERRL.  EOM grossly intact. Sinuses: nontender; Nose: nares patent without rhinorrhea, Throat: oropharynx clear, tonsils non erythematous or enlarged, uvula midline  Neck: supple without LAD Lungs: unlabored respirations, symmetrical air entry; cough: mild; no respiratory distress; CTAB Heart: regular rate and rhythm.  Radial pulses 2+ symmetrical bilaterally Skin: warm and dry Psychological: alert and cooperative; normal mood and affect  LABS:  Results for orders placed or performed during the hospital encounter of 03/08/20 (from the past 24 hour(s))  POCT Urinalysis Dipstick (ED/UC)     Status: Abnormal   Collection Time: 03/08/20 10:03 AM  Result Value Ref Range   Glucose, UA NEGATIVE NEGATIVE mg/dL   Bilirubin Urine NEGATIVE NEGATIVE   Ketones, ur TRACE (A) NEGATIVE mg/dL   Specific Gravity, Urine 1.020 1.005 - 1.030   Hgb urine dipstick LARGE (A) NEGATIVE   pH 6.0 5.0 - 8.0   Protein, ur 30 (A) NEGATIVE mg/dL   Urobilinogen, UA 0.2 0.0 - 1.0 mg/dL   Nitrite NEGATIVE NEGATIVE   Leukocytes,Ua NEGATIVE NEGATIVE     ASSESSMENT & PLAN:  1. Viral illness   2. Encounter for screening for COVID-19   3. Dysuria   4. Fever, unspecified fever cause     Meds ordered this encounter  Medications    . acetaminophen (TYLENOL) tablet 650 mg      COVID testing ordered.  It will take between 1-2 days for test results.  Someone will contact you regarding abnormal results.    Patient should remain in quarantine until they have received Covid results.  If negative you may resume normal activities (go back to work/school) while practicing hand hygiene, social distance, and mask wearing.  If positive, patient should remain in quarantine for 10 days from symptom onset AND greater than 72 hours after symptoms resolution (absence of fever  without the use of fever-reducing medication and improvement in respiratory symptoms), whichever is longer Get plenty of rest and push fluids Use OTC zyrtec for nasal congestion, runny nose, and/or sore throat Use OTC flonase for nasal congestion and runny nose Use medications daily for symptom relief Use OTC medications like ibuprofen or tylenol as needed fever or pain Call or go to the ED if you have any new or worsening symptoms such as fever, worsening cough, shortness of breath, chest tightness, chest pain, turning blue, changes in mental status.  Reviewed expectations re: course of current medical issues. Questions answered. Outlined signs and symptoms indicating need for more acute intervention. Patient verbalized understanding. After Visit Summary given.         Faustino Congress, NP 03/08/20 1025

## 2020-03-08 NOTE — Discharge Instructions (Addendum)
Your COVID test is pending.  You should self quarantine until the test result is back.    Take Tylenol as needed for fever or discomfort.  Rest and keep yourself hydrated.    Go to the emergency department if you develop acute worsening symptoms.     

## 2020-03-31 ENCOUNTER — Other Ambulatory Visit: Payer: Self-pay | Admitting: Adult Health

## 2020-03-31 DIAGNOSIS — R238 Other skin changes: Secondary | ICD-10-CM

## 2020-04-12 ENCOUNTER — Telehealth: Payer: Self-pay | Admitting: Cardiology

## 2020-04-12 NOTE — Telephone Encounter (Signed)
Left message for patient to call back  

## 2020-04-12 NOTE — Telephone Encounter (Signed)
STAT if HR is under 50 or over 120 (normal HR is 60-100 beats per minute)  1) What is your heart rate? Unsure right now  2) Do you have a log of your heart rate readings (document readings)?between 70's - 80's  3) Do you have any other symptoms? Patient denies any symptoms. No dizziness, SOB, chest pain or fatigue. She state that she just got over covid and did experience fatigue but never a fast HR. She states her BP is staying between 140-150. She states that her HR beats really fast at night when she goes to bed or when shes going up the steps or moving from one room to the next. 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 She states her son and daughter both have fast HR's and her daughter has a murmur.

## 2020-04-12 NOTE — Telephone Encounter (Signed)
Patient states that she had COVID a few weeks ago. She has recovered and is now feeling much better however she has been having episodes of increased heart rate ever since. She states that it occurs at night and also when she is going up stairs. She states that she feels like her pulse is regular just beating fast. She denies any other symptoms such as SOB, dizziness, fatigue, or chest pain. She states that the highest reading she has is heart rate in the 90s. Advised patient to continue to monitor and let us know if she develops any additional symptoms or if heart rate is increasing while at rest.  Patient is due for her yearly follow up with Dr. Radford Pax so I have made her an appointment.

## 2020-04-26 ENCOUNTER — Encounter: Payer: Self-pay | Admitting: Cardiology

## 2020-04-26 ENCOUNTER — Telehealth: Payer: Self-pay | Admitting: *Deleted

## 2020-04-26 ENCOUNTER — Other Ambulatory Visit: Payer: Self-pay

## 2020-04-26 ENCOUNTER — Ambulatory Visit (INDEPENDENT_AMBULATORY_CARE_PROVIDER_SITE_OTHER): Payer: 59 | Admitting: Cardiology

## 2020-04-26 VITALS — BP 138/98 | HR 88 | Ht 63.0 in | Wt 237.0 lb

## 2020-04-26 DIAGNOSIS — R55 Syncope and collapse: Secondary | ICD-10-CM | POA: Diagnosis not present

## 2020-04-26 DIAGNOSIS — I10 Essential (primary) hypertension: Secondary | ICD-10-CM | POA: Diagnosis not present

## 2020-04-26 NOTE — Progress Notes (Signed)
Cardiology Office Note:    Date:  04/26/2020   ID:  Janet Sanchez, DOB March 20, 1971, MRN [id number]  PCP:  [name], NP  Cardiologist:  Fransico Him, MD    Referring MD: [name], NP   Chief Complaint  Patient presents with  . Follow-up    Vasovagal syncope    History of Present Illness:    Janet Sanchez is a 49 y.o. female with a hx of HTN and vasovagal syncope.  She is here today for followup and is doing well.  She denies any exertional chest pain or pressure, SOB, DOE, PND, orthopnea,  dizziness, palpitations or syncope. Occasionally she will have some LE edema. Sometimes she will have a cramping sensation on the right side of her heart from time to time usually when eating or when straining to use the restroom.  She is compliant with her meds and is tolerating meds with no SE.    Past Medical History:  Diagnosis Date  . Enlarged tonsils   . Herpes   . Hypertension   . Migraines   . Morbid obesity ([place])   . Vasovagal syncope 10/24/2017    Past Surgical History:  Procedure Laterality Date  . TUBAL LIGATION      Current Medications: Current Meds  Medication Sig  . amLODipine (NORVASC) 10 MG tablet Take 10 mg by mouth daily.  Marland Kitchen aspirin EC 81 MG tablet Take 81 mg by mouth daily.  Marland Kitchen aspirin-acetaminophen-caffeine (EXCEDRIN MIGRAINE) 250-250-65 MG tablet Take by mouth every 6 (six) hours as needed for headache.  . cetirizine (ZYRTEC) 10 MG tablet Take 10 mg by mouth daily.  . fluticasone (FLONASE) 50 MCG/ACT nasal spray Place 1 spray into both nostrils daily.  Marland Kitchen gabapentin (NEURONTIN) 100 MG capsule Take 2 capsules (200 mg total) by mouth at bedtime.  . irbesartan (AVAPRO) 300 MG tablet Take 300 mg by mouth daily.  . Multiple Vitamin (MULTIVITAMIN) tablet Take 1 tablet by mouth daily.  Marland Kitchen omeprazole (PRILOSEC) 20 MG capsule TAKE 1 CAPSULE BY MOUTH EVERY DAY  . valACYclovir (VALTREX) 1000 MG tablet take 1 tablet by mouth 2 times daily for 10 days as  needed for flare  . VENTOLIN HFA 108 (90 Base) MCG/ACT inhaler INHALE 2 PUFFS INTO THE LUNGS EVERY 6 HOURS AS NEEDED FOR FOR WHEEZING OR SHORTNESS OF BREATH     Allergies:   Potassium-containing compounds, Hctz [hydrochlorothiazide], and Lisinopril   Social History   Socioeconomic History  . Marital status: Married    Spouse name: Not on file  . Number of children: Not on file  . Years of education: Not on file  . Highest education level: Not on file  Occupational History  . Not on file  Tobacco Use  . Smoking status: Never Smoker  . Smokeless tobacco: Never Used  Vaping Use  . Vaping Use: Never used  Substance and Sexual Activity  . Alcohol use: No  . Drug use: No  . Sexual activity: Yes    Birth control/protection: Surgical  Other Topics Concern  . 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 Difficulty of Paying Living Expenses: Not on file  Food Insecurity:   . Worried About Charity fundraiser in the Last Year: Not on file  . Ran Out  of Food in the Last Year: Not on file  Transportation Needs:   . Lack of Transportation (Medical): Not on file  . Lack of Transportation (Non-Medical): Not on file  Physical Activity:   . Days of Exercise per Week: Not on file  . Minutes of Exercise per Session: Not on file  Stress:   . Feeling of Stress : Not on file  Social Connections:   . Frequency of Communication with Friends and Family: Not on file  . Frequency of Social Gatherings with Friends and Family: Not on file  . Attends Religious Services: Not on file  . Active Member of Clubs or Organizations: Not on file  . Attends Archivist Meetings: Not on file  . Marital Status: Not on file     Family History: The patient's family history includes Arrhythmia in her brother; Heart attack (age of onset: 71) in  her mother; Heart attack (age of onset: 69) in her father; Hypertension in her brother, father, sister, and sister.  ROS:   Please see the history of present illness.    ROS  All other systems reviewed and negative.   EKGs/Labs/Other Studies Reviewed:    The following studies were reviewed today: none  EKG:  EKG is ordered today and showed NSR with septal infarct and no ST changes  Recent Labs: 12/31/2019: ALT 22; BUN 14; Creatinine, Ser 0.99; Hemoglobin 11.4; Platelets 301.0; Potassium 3.8; Sodium 136; TSH 3.07   Recent Lipid Panel    Component Value Date/Time   CHOL 194 12/31/2019 1159   TRIG 70.0 12/31/2019 1159   HDL 62.20 12/31/2019 1159   CHOLHDL 3 12/31/2019 1159   VLDL 14.0 12/31/2019 1159   LDLCALC 118 (H) 12/31/2019 1159    Physical Exam:    VS:  BP (!) 138/98   Pulse 88   Ht 5\' 3"  (1.6 m)   Wt 237 lb (107.5 kg)   BMI 41.98 kg/m     Wt Readings from Last 3 Encounters:  04/26/20 237 lb (107.5 kg)  12/30/19 247 lb (112 kg)  10/14/19 244 lb (110.7 kg)     GEN: Well nourished, well developed in no acute distress HEENT: Normal NECK: No JVD; No carotid bruits LYMPHATICS: No lymphadenopathy CARDIAC:RRR, no murmurs, rubs, gallops RESPIRATORY:  Clear to auscultation without rales, wheezing or rhonchi  ABDOMEN: Soft, non-tender, non-distended MUSCULOSKELETAL:  No edema; No deformity  SKIN: Warm and dry NEUROLOGIC:  Alert and oriented x 3 PSYCHIATRIC:  Normal affect    ASSESSMENT:    1. Primary hypertension   2. Vasovagal syncope   3. Morbid obesity ([place])    PLAN:    In order of problems listed above:  1.  HTN -BP borderline controlled on exam today -nephrology is following -continue Irbesartan 300mg  daily and amlodipine 10mg  daily -creatinine 0.99 on 12/31/2019 -she is followed by Nephrology who is managing her HTN.  -I have asked her to check her BP daily at lunch for a week and call with results  -She has multiple BP med intolerances. -I will  get a home sleep study to make sure she does not have significant OSA driving her HTN  2.  Vasovagal syncope -she has not had any dizziness or syncope  3.  Morbid Obesity -I have encouraged her to get into a routine exercise program and cut back on carbs and portions.    Medication Adjustments/Labs and Tests Ordered: Current medicines are reviewed at length with the patient today.  Concerns regarding medicines  are outlined above.  Orders Placed This Encounter  Procedures  . EKG 12-Lead   No orders of the defined types were placed in this encounter.   Signed, Fransico Him, MD  04/26/2020 11:44 AM    [place]

## 2020-04-26 NOTE — Addendum Note (Signed)
Addended by: Antonieta Iba on: 04/26/2020 11:51 AM   Modules accepted: Orders

## 2020-04-26 NOTE — Patient Instructions (Addendum)
Please check your blood pressure daily for one week and call us or send a MyChart message with your readings.  Medication Instructions:  Your physician recommends that you continue on your current medications as directed. Please refer to the Current Medication list given to you today.  *If you need a refill on your cardiac medications before your next appointment, please call your pharmacy*  Tests/Procedures: Your physician has recommended that you have a sleep study. This test records several body functions during sleep, including: brain activity, eye movement, oxygen and carbon dioxide blood levels, heart rate and rhythm, breathing rate and rhythm, the flow of air through your mouth and nose, snoring, body muscle movements, and chest and belly movement.   Follow-Up: At [facility], you and your health needs are our priority.  As part of our continuing mission to provide you with exceptional heart care, we have created designated Provider Care Teams.  These Care Teams include your primary Cardiologist (physician) and Advanced Practice Providers (APPs -  Physician Assistants and Nurse Practitioners) who all work together to provide you with the care you need, when you need it.  Your next appointment:   1 year(s)  The format for your next appointment:   In Person  Provider:   You may see Fransico Him, MD or one of the following Advanced Practice Providers on your designated Care Team:    Melina Copa, PA-C  Ermalinda Barrios, PA-C

## 2020-04-26 NOTE — Telephone Encounter (Addendum)
Message -----  From: Antonieta Iba, RN  Sent: 04/26/2020 11:58 AM EDT  To: Freada Bergeron, CMA, Cv Div Sleep Studies   Home sleep study has been ordered for hypertension.  Thanks!      HST PA submitted to [facility] via web portal.  Notice of medical Coverage Approval came for patient. Ok to schedule HST valid dates are 05/10/20 -08/08/2020 aut #

## 2020-05-04 NOTE — Telephone Encounter (Signed)
Patient is aware and agreeable to Home Sleep Study through [facility]. Patient is scheduled for 06/01/20 at 5 pm to pick up home sleep kit and meet with Respiratory therapist at [facility]. Patient is aware that if this appointment date and time does not work for them they should contact Artis Delay directly at [phone]. Patient is aware that a sleep packet will be sent from [facility] in week. Patient is agreeable to treatment and thankful for call.

## 2020-05-14 ENCOUNTER — Ambulatory Visit
Admission: RE | Admit: 2020-05-14 | Discharge: 2020-05-14 | Disposition: A | Discharge status: Home / Self Care | Payer: 59 | Source: Ambulatory Visit | Attending: Adult Health | Admitting: Adult Health

## 2020-05-14 ENCOUNTER — Other Ambulatory Visit: Payer: Self-pay

## 2020-05-14 ENCOUNTER — Other Ambulatory Visit: Payer: Self-pay | Admitting: Adult Health

## 2020-05-14 DIAGNOSIS — R928 Other abnormal and inconclusive findings on diagnostic imaging of breast: Secondary | ICD-10-CM

## 2020-05-18 ENCOUNTER — Other Ambulatory Visit: Payer: Self-pay | Admitting: Adult Health

## 2020-05-18 DIAGNOSIS — R238 Other skin changes: Secondary | ICD-10-CM

## 2020-06-01 ENCOUNTER — Other Ambulatory Visit: Payer: Self-pay

## 2020-06-01 ENCOUNTER — Ambulatory Visit (HOSPITAL_BASED_OUTPATIENT_CLINIC_OR_DEPARTMENT_OTHER): Payer: 59

## 2020-06-01 DIAGNOSIS — I10 Essential (primary) hypertension: Secondary | ICD-10-CM

## 2020-06-07 ENCOUNTER — Encounter: Payer: Self-pay | Admitting: Adult Health

## 2020-06-07 ENCOUNTER — Other Ambulatory Visit: Payer: Self-pay | Admitting: Adult Health

## 2020-06-09 ENCOUNTER — Ambulatory Visit (HOSPITAL_BASED_OUTPATIENT_CLINIC_OR_DEPARTMENT_OTHER): Payer: 59 | Admitting: Cardiology

## 2020-06-09 ENCOUNTER — Other Ambulatory Visit: Payer: Self-pay

## 2020-06-15 NOTE — Procedures (Signed)
Erroneous encounter

## 2020-06-15 NOTE — Progress Notes (Signed)
This encounter was created in error - please disregard.

## 2020-06-25 ENCOUNTER — Ambulatory Visit (HOSPITAL_BASED_OUTPATIENT_CLINIC_OR_DEPARTMENT_OTHER): Payer: 59 | Attending: Cardiology | Admitting: Cardiology

## 2020-06-25 DIAGNOSIS — G4733 Obstructive sleep apnea (adult) (pediatric): Secondary | ICD-10-CM | POA: Insufficient documentation

## 2020-06-25 DIAGNOSIS — I10 Essential (primary) hypertension: Secondary | ICD-10-CM | POA: Diagnosis not present

## 2020-06-29 NOTE — Procedures (Signed)
   Patient Name: Janet Sanchez, Janet Sanchez Date: 06/26/2020 Gender: Female D.O.B: 1970-12-02 Age (years): 77 Referring Provider: Fransico Him MD, ABSM Height (inches): 63 Interpreting Physician: Fransico Him MD, ABSM Weight (lbs): 237 RPSGT: Jacolyn Reedy BMI: 42 MRN: [id number] Neck Size: 16.00  CLINICAL INFORMATION Sleep Study Type: HST  Indication for sleep study: N/A  Epworth Sleepiness Score: 9  SLEEP STUDY TECHNIQUE A multi-channel overnight portable sleep study was performed. The channels recorded were: nasal airflow, thoracic respiratory movement, and oxygen saturation with a pulse oximetry. Snoring was also monitored.  MEDICATIONS Patient self administered medications include: N/A.  SLEEP ARCHITECTURE Patient was studied for 278.4 minutes. The sleep efficiency was 100.0 % and the patient was supine for 0%. The arousal index was 0.0 per hour.  RESPIRATORY PARAMETERS The overall AHI was 6.5 per hour, with a central apnea index of 0.0 per hour.  The oxygen nadir was 89% during sleep.  CARDIAC DATA Mean heart rate during sleep was 76.3 bpm.   IMPRESSIONS - Mild obstructive sleep apnea occurred during this study (AHI = 6.5/h). - No significant central sleep apnea occurred during this study (CAI = 0.0/h). - Mild oxygen desaturation was noted during this study (Min O2 = 89%). - No snoring was audible during this study.  DIAGNOSIS - Obstructive Sleep Apnea (G47.33)  RECOMMENDATIONS  - Given how mild the sleep disordered breathing and unsure of how much REM sleep was obtained, recommend in lab study to help determine further treatment.  - Avoid alcohol, sedatives and other CNS depressants that may worsen sleep apnea and disrupt normal sleep architecture. - Sleep hygiene should be reviewed to assess factors that may improve sleep quality. - Weight management and regular exercise should be initiated or continued.  [Electronically signed] 06/29/2020 02:03 PM  Fransico Him MD, ABSM Diplomate, American Board of Sleep Medicine

## 2020-07-01 ENCOUNTER — Telehealth: Payer: Self-pay | Admitting: *Deleted

## 2020-07-01 NOTE — Telephone Encounter (Signed)
Informed patient of sleep study results and patient understanding was verbalized. Patient understands her sleep study showed Patient has very minimal OSA. Please set up in lab study to see how much OSA she has during REM sleep to determine best treatment plan. Patient would like to have a virtual visit with the doctor to discuss her results. An appointment has been made for 07/26/20.

## 2020-07-01 NOTE — Telephone Encounter (Signed)
-----   Message from Sueanne Margarita, MD sent at 06/29/2020  2:07 PM EST ----- Patient has very minimal OSA.  Please set up in lab study to see how much OSA she has during REM sleep to determine best treatment plan

## 2020-07-02 NOTE — Telephone Encounter (Signed)
Patient notified.  Left detailed message on voicemail and informed patient to call back with questions.

## 2020-07-02 NOTE — Telephone Encounter (Signed)
Gae Bon the results are inconclusive that is why I ordered an in lab study

## 2020-07-22 ENCOUNTER — Telehealth: Payer: Self-pay

## 2020-07-22 NOTE — Telephone Encounter (Signed)
°  Patient Consent for Virtual Visit         Janet Sanchez has provided verbal consent on 07/22/2020 for a virtual visit (video or telephone).   CONSENT FOR VIRTUAL VISIT FOR:  Janet Sanchez  By participating in this virtual visit I agree to the following:  I hereby voluntarily request, consent and authorize [facility] and its employed or contracted physicians, physician assistants, nurse practitioners or other licensed health care professionals (the Practitioner), to provide me with telemedicine health care services (the Services") as deemed necessary by the treating Practitioner. I acknowledge and consent to receive the Services by the Practitioner via telemedicine. I understand that the telemedicine visit will involve communicating with the Practitioner through live audiovisual communication technology and the disclosure of certain medical information by electronic transmission. I acknowledge that I have been given the opportunity to request an in-person assessment or other available alternative prior to the telemedicine visit and am voluntarily participating in the telemedicine visit.  I understand that I have the right to withhold or withdraw my consent to the use of telemedicine in the course of my care at any time, without affecting my right to future care or treatment, and that the Practitioner or I may terminate the telemedicine visit at any time. I understand that I have the right to inspect all information obtained and/or recorded in the course of the telemedicine visit and may receive copies of available information for a reasonable fee.  I understand that some of the potential risks of receiving the Services via telemedicine include:   Delay or interruption in medical evaluation due to technological equipment failure or disruption;  Information transmitted may not be sufficient (e.g. poor resolution of images) to allow for appropriate medical decision making by the  Practitioner; and/or   In rare instances, security protocols could fail, causing a breach of personal health information.  Furthermore, I acknowledge that it is my responsibility to provide information about my medical history, conditions and care that is complete and accurate to the best of my ability. I acknowledge that Practitioner's advice, recommendations, and/or decision may be based on factors not within their control, such as incomplete or inaccurate data provided by me or distortions of diagnostic images or specimens that may result from electronic transmissions. I understand that the practice of medicine is not an exact science and that Practitioner makes no warranties or guarantees regarding treatment outcomes. I acknowledge that a copy of this consent can be made available to me via my patient portal [facility] MyChart), or I can request a printed copy by calling the office of [facility].    I understand that my insurance will be billed for this visit.   I have read or had this consent read to me.  I understand the contents of this consent, which adequately explains the benefits and risks of the Services being provided via telemedicine.   I have been provided ample opportunity to ask questions regarding this consent and the Services and have had my questions answered to my satisfaction.  I give my informed consent for the services to be provided through the use of telemedicine in my medical care

## 2020-07-26 ENCOUNTER — Telehealth (INDEPENDENT_AMBULATORY_CARE_PROVIDER_SITE_OTHER): Payer: 59 | Admitting: Cardiology

## 2020-07-26 ENCOUNTER — Other Ambulatory Visit: Payer: Self-pay

## 2020-07-26 ENCOUNTER — Encounter: Payer: Self-pay | Admitting: Cardiology

## 2020-07-26 VITALS — BP 144/90 | Ht 63.0 in | Wt 242.0 lb

## 2020-07-26 DIAGNOSIS — I10 Essential (primary) hypertension: Secondary | ICD-10-CM | POA: Diagnosis not present

## 2020-07-26 DIAGNOSIS — G4733 Obstructive sleep apnea (adult) (pediatric): Secondary | ICD-10-CM

## 2020-07-26 NOTE — Progress Notes (Signed)
Virtual Visit via Video Note   This visit type was conducted due to national recommendations for restrictions regarding the COVID-19 Pandemic (e.g. social distancing) in an effort to limit this patient's exposure and mitigate transmission in our community.  Due to her co-morbid illnesses, this patient is at least at moderate risk for complications without adequate follow up.  This format is felt to be most appropriate for this patient at this time.  All issues noted in this document were discussed and addressed.  A limited physical exam was performed with this format.  Please refer to the patient's chart for her consent to telehealth for [facility].    Date:  07/26/2020   ID:  [name], DOB 03-11-1971, MRN [id number] The patient was identified using 2 identifiers.  Patient Location: Home Provider Location: Home Office  PCP:  [name], NP  Cardiologist:  Fransico Him, MD  Electrophysiologist:  None   Evaluation Performed:  Follow-Up Visit  Chief Complaint:  OSA  History of Present Illness:    Janet Sanchez is a 50 y.o. female with a hx of tonsilar hypertrohpy, HTN, migraine HAs, vasovagal syncope and morbid obesity.  When I last saw her I ordered a HST to rule out OSA that could be affecting her BP. She underwent home sleep study showing very mild OSA with an AHI of 6.5/hr and O2 sats as low as 89%.  She is now here to discuss the results. She occasionally has some nonrestorative sleep and sleepiness during work but cannot nap due to work.  She occasionally will awaken snoring and awaken choking.    The patient does not have symptoms concerning for COVID-19 infection (fever, chills, cough, or new shortness of breath).    Past Medical History:  Diagnosis Date  . Enlarged tonsils   . Herpes   . Hypertension   . Migraines   . Morbid obesity ([place])   . Vasovagal syncope 10/24/2017   Past Surgical History:  Procedure Laterality Date  . TUBAL LIGATION        Current Meds  Medication Sig  . amLODipine (NORVASC) 10 MG tablet Take 10 mg by mouth daily.  Marland Kitchen aspirin EC 81 MG tablet Take 81 mg by mouth daily.  Marland Kitchen aspirin-acetaminophen-caffeine (EXCEDRIN MIGRAINE) 250-250-65 MG tablet Take by mouth every 6 (six) hours as needed for headache.  . cetirizine (ZYRTEC) 10 MG tablet Take 10 mg by mouth daily.  . fluticasone (FLONASE) 50 MCG/ACT nasal spray Place 1 spray into both nostrils daily.  Marland Kitchen gabapentin (NEURONTIN) 100 MG capsule Take 2 capsules (200 mg total) by mouth at bedtime.  . irbesartan (AVAPRO) 300 MG tablet Take 300 mg by mouth daily.  . Multiple Vitamin (MULTIVITAMIN) tablet Take 1 tablet by mouth daily.  Marland Kitchen omeprazole (PRILOSEC) 20 MG capsule TAKE 1 CAPSULE BY MOUTH EVERY DAY  . valACYclovir (VALTREX) 1000 MG tablet take 1 tablet by mouth 2 times daily for 10 days as needed for flare  . VENTOLIN HFA 108 (90 Base) MCG/ACT inhaler INHALE 2 PUFFS INTO THE LUNGS EVERY 6 HOURS AS NEEDED FOR FOR WHEEZING OR SHORTNESS OF BREATH     Allergies:   Potassium-containing compounds, Hctz [hydrochlorothiazide], and Lisinopril   Social History   Tobacco Use  . Smoking status: Never Smoker  . Smokeless tobacco: Never Used  Vaping Use  . Vaping Use: Never used  Substance Use Topics  . Alcohol use: No  . Drug use: No     Family Hx: The  patient's family history includes Arrhythmia in her brother; Heart attack (age of onset: 56) in her mother; Heart attack (age of onset: 54) in her father; Hypertension in her brother, father, sister, and sister.  ROS:   Please see the history of present illness.     All other systems reviewed and are negative.   Prior CV studies:   The following studies were reviewed today:  Home sleep study  Labs/Other Tests and Data Reviewed:    EKG:  No ECG reviewed.  Recent Labs: 12/31/2019: ALT 22; BUN 14; Creatinine, Ser 0.99; Hemoglobin 11.4; Platelets 301.0; Potassium 3.8; Sodium 136; TSH 3.07   Recent Lipid  Panel Lab Results  Component Value Date/Time   CHOL 194 12/31/2019 11:59 AM   TRIG 70.0 12/31/2019 11:59 AM   HDL 62.20 12/31/2019 11:59 AM   CHOLHDL 3 12/31/2019 11:59 AM   LDLCALC 118 (H) 12/31/2019 11:59 AM    Wt Readings from Last 3 Encounters:  07/26/20 242 lb (109.8 kg)  06/09/20 237 lb (107.5 kg)  06/01/20 237 lb (107.5 kg)      Objective:    Vital Signs:  BP (!) 144/90   Ht 5\' 3"  (1.6 m)   Wt 242 lb (109.8 kg)   BMI 42.87 kg/m    VITAL SIGNS:  reviewed GEN:  no acute distress EYES:  sclerae anicteric, EOMI - Extraocular Movements Intact RESPIRATORY:  normal respiratory effort, symmetric expansion CARDIOVASCULAR:  no peripheral edema SKIN:  no rash, lesions or ulcers. MUSCULOSKELETAL:  no obvious deformities. NEURO:  alert and oriented x 3, no obvious focal deficit PSYCH:  normal affect  ASSESSMENT & PLAN:    1. OSA -HST with mild OSA with AHI 6.5/hr with no significant hypoxemia -she says that her sx are not that bothersome and oral device would be cost prohibitive -we discussed the CPAP therapy and she wants to hold off for now and try weight loss and see ho things go -We discussed good sleep hygiene practices and avoid sleeping supine.   -recommended exercise and weight loss program which should help with apnea  2.  HTN -BP controlled on exam -continue amlodipine 10mg  daily, Irbesartan 300mg  daily  3.  Morbid Obesity -I have encouraged her to get into a routine exercise program and cut back on carbs and portions.   COVID-19 Education: The signs and symptoms of COVID-19 were discussed with the patient and how to seek care for testing (follow up with PCP or arrange E-visit).  The importance of social distancing was discussed today.  Time:   Today, I have spent 20 minutes with the patient with telehealth technology discussing the above problems.     Medication Adjustments/Labs and Tests Ordered: Current medicines are reviewed at length with the  patient today.  Concerns regarding medicines are outlined above.   Tests Ordered: No orders of the defined types were placed in this encounter.   Medication Changes: No orders of the defined types were placed in this encounter.   Follow Up: 1 year in person  Signed, , MD  07/26/2020 11:42 AM    [facility]

## 2020-07-26 NOTE — Patient Instructions (Signed)

## 2020-08-04 LAB — BASIC METABOLIC PANEL
BUN: 16 (ref 4–21)
CO2: 27 — AB (ref 13–22)
Chloride: 100 (ref 99–108)
Creatinine: 1 (ref 0.5–1.1)
Glucose: 80
Potassium: 4.1 (ref 3.4–5.3)
Sodium: 135 — AB (ref 137–147)

## 2020-08-04 LAB — COMPREHENSIVE METABOLIC PANEL
Albumin: 4.3 (ref 3.5–5.0)
Calcium: 10.1 (ref 8.7–10.7)
GFR calc Af Amer: 81
GFR calc non Af Amer: 71

## 2020-08-13 ENCOUNTER — Encounter: Payer: Self-pay | Admitting: Family Medicine

## 2020-09-27 ENCOUNTER — Encounter: Payer: Self-pay | Admitting: Adult Health

## 2020-09-28 NOTE — Telephone Encounter (Signed)
CPE was on 12/30/19, ?? If pt just needs a nurse visit for a TB test and you will fill out form or does pt need an appt to review form in person with PCP, please advise

## 2020-10-25 ENCOUNTER — Other Ambulatory Visit: Payer: Self-pay

## 2020-10-25 ENCOUNTER — Telehealth: Payer: Self-pay | Admitting: Adult Health

## 2020-10-25 ENCOUNTER — Ambulatory Visit (INDEPENDENT_AMBULATORY_CARE_PROVIDER_SITE_OTHER): Payer: 59 | Admitting: *Deleted

## 2020-10-25 DIAGNOSIS — Z111 Encounter for screening for respiratory tuberculosis: Secondary | ICD-10-CM | POA: Diagnosis not present

## 2020-10-25 NOTE — Telephone Encounter (Signed)
Patient dropped off a form to be filled out.  She is requesting a call once the form is completed.   Form was placed in Cory's folder.

## 2020-10-27 LAB — TB SKIN TEST
Induration: 0 mm
TB Skin Test: NEGATIVE

## 2020-11-02 ENCOUNTER — Other Ambulatory Visit: Payer: Self-pay | Admitting: Adult Health

## 2020-11-02 ENCOUNTER — Encounter: Payer: Self-pay | Admitting: Adult Health

## 2020-11-02 MED ORDER — FLUTICASONE PROPIONATE 50 MCG/ACT NA SUSP: 1.0000 | Freq: Every day | NASAL | 0 refills | Status: AC

## 2020-11-10 ENCOUNTER — Encounter: Payer: Self-pay | Admitting: Adult Health

## 2020-11-12 ENCOUNTER — Ambulatory Visit (INDEPENDENT_AMBULATORY_CARE_PROVIDER_SITE_OTHER): Payer: 59

## 2020-11-12 ENCOUNTER — Other Ambulatory Visit: Payer: 59

## 2020-11-12 ENCOUNTER — Other Ambulatory Visit: Payer: Self-pay

## 2020-11-12 ENCOUNTER — Other Ambulatory Visit: Payer: Self-pay | Admitting: Adult Health

## 2020-11-12 DIAGNOSIS — M79671 Pain in right foot: Secondary | ICD-10-CM

## 2020-11-17 ENCOUNTER — Other Ambulatory Visit: Payer: Self-pay

## 2020-11-17 ENCOUNTER — Ambulatory Visit
Admission: RE | Admit: 2020-11-17 | Discharge: 2020-11-17 | Disposition: A | Discharge status: Home / Self Care | Payer: 59 | Source: Ambulatory Visit | Attending: Adult Health | Admitting: Adult Health

## 2020-11-17 DIAGNOSIS — R928 Other abnormal and inconclusive findings on diagnostic imaging of breast: Secondary | ICD-10-CM

## 2020-11-25 ENCOUNTER — Other Ambulatory Visit: Payer: Self-pay | Admitting: Adult Health

## 2020-11-26 ENCOUNTER — Encounter: Payer: Self-pay | Admitting: Adult Health

## 2020-12-21 ENCOUNTER — Encounter: Payer: Self-pay | Admitting: Adult Health

## 2020-12-23 ENCOUNTER — Ambulatory Visit (INDEPENDENT_AMBULATORY_CARE_PROVIDER_SITE_OTHER): Payer: 59 | Admitting: Adult Health

## 2020-12-23 ENCOUNTER — Encounter: Payer: Self-pay | Admitting: Adult Health

## 2020-12-23 ENCOUNTER — Other Ambulatory Visit: Payer: Self-pay

## 2020-12-23 VITALS — BP 140/80 | HR 69 | Temp 98.5°F | Wt 246.8 lb

## 2020-12-23 DIAGNOSIS — M2011 Hallux valgus (acquired), right foot: Secondary | ICD-10-CM | POA: Diagnosis not present

## 2020-12-23 NOTE — Patient Instructions (Signed)
I am going to refer you to [facility] for evaluation    [address], [place], [place] [address]  They will call you schedule your exam

## 2020-12-23 NOTE — Progress Notes (Signed)
Subjective:    Patient ID: Janet Sanchez, female    DOB: 07/13/1971, 50 y.o.   MRN: [id number]  HPI 50 year old female who  has a past medical history of Enlarged tonsils, Herpes, Hypertension, Migraines, Morbid obesity ([place]), and Vasovagal syncope (10/24/2017).  She presents to the office today for follow up regarding pain in her right great toe. Reports that pain is worse with walking and standing for periods of time. She is not able to wear shoes due to the size on the bunion and has to wear flip flops.    Review of Systems See HPI   Past Medical History:  Diagnosis Date  . Enlarged tonsils   . Herpes   . Hypertension   . Migraines   . Morbid obesity ([place])   . Vasovagal syncope 10/24/2017    Social History   Socioeconomic History  . Marital status: Married    Spouse name: Not on file  . Number of children: Not on file  . Years of education: Not on file  . Highest education level: Not on file  Occupational History  . Not on file  Tobacco Use  . Smoking status: Never Smoker  . Smokeless tobacco: Never Used  Vaping Use  . Vaping Use: Never used  Substance and Sexual Activity  . Alcohol use: No  . Drug use: No  . Sexual activity: Yes    Birth control/protection: Surgical  Other Topics Concern  . 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 TUBAL LIGATION      Family History  Problem Relation Age of Onset  . Heart attack Mother 51  . Hypertension Father   . Heart attack Father 90  . Hypertension Sister   . Hypertension Brother   .  Arrhythmia Brother   . Hypertension Sister     Allergies  Allergen Reactions  . Potassium-Containing Compounds Other (See Comments)    Severe cramps caused her to pass out.  Marland Kitchen Hctz [Hydrochlorothiazide] Other (See Comments)    Renal impairment   . Lisinopril Other (See Comments)    Headache     Current Outpatient Medications on File Prior to Visit  Medication Sig Dispense Refill  . amLODipine (NORVASC) 10 MG tablet Take 10 mg by mouth daily.    Marland Kitchen aspirin EC 81 MG tablet Take 81 mg by mouth daily.    Marland Kitchen aspirin-acetaminophen-caffeine (EXCEDRIN MIGRAINE) 250-250-65 MG tablet Take by mouth every 6 (six) hours as needed for headache.    . cetirizine (ZYRTEC) 10 MG tablet Take 10 mg by mouth daily.    . fluticasone (FLONASE) 50 MCG/ACT nasal spray Place 1 spray into both nostrils daily. 16 g 0  . gabapentin (NEURONTIN) 100 MG capsule Take 2 capsules (200 mg total) by mouth at bedtime. 180 capsule 3  . irbesartan (AVAPRO) 300 MG tablet Take 300 mg by mouth daily.    . Multiple Vitamin (MULTIVITAMIN) tablet Take 1 tablet by mouth daily.    Marland Kitchen omeprazole (PRILOSEC) 20  MG capsule TAKE 1 CAPSULE BY MOUTH EVERY DAY 90 capsule 1  . valACYclovir (VALTREX) 1000 MG tablet take 1 tablet by mouth 2 times daily for 10 days as needed for flare 90 tablet 0  . VENTOLIN HFA 108 (90 Base) MCG/ACT inhaler INHALE 2 PUFFS INTO THE LUNGS EVERY 6 HOURS AS NEEDED FOR FOR WHEEZING OR SHORTNESS OF BREATH 18 g 2   No current facility-administered medications on file prior to visit.    BP 140/80 (BP Location: Left Arm, Patient Position: Sitting, Cuff Size: Normal)   Pulse 69   Temp 98.5 F (36.9 C) (Oral)   Wt 246 lb 12.8 oz (111.9 kg)   SpO2 98%   BMI 43.72 kg/m       Objective:   Physical Exam Vitals and nursing note reviewed.  Constitutional:      Appearance: Normal appearance.  Musculoskeletal:     Comments: Bunion noted on right great toe. No painful with palpation   Skin:    General: Skin is  warm and dry.     Capillary Refill: Capillary refill takes less than 2 seconds.  Neurological:     General: No focal deficit present.     Mental Status: She is alert and oriented to person, place, and time.       Assessment & Plan:  1. Hallux valgus (acquired), right foot - Ambulatory referral to [place], NP

## 2020-12-29 ENCOUNTER — Other Ambulatory Visit: Payer: Self-pay

## 2020-12-30 ENCOUNTER — Ambulatory Visit (INDEPENDENT_AMBULATORY_CARE_PROVIDER_SITE_OTHER): Payer: 59 | Admitting: Adult Health

## 2020-12-30 ENCOUNTER — Encounter: Payer: Self-pay | Admitting: Adult Health

## 2020-12-30 VITALS — BP 140/82 | HR 81 | Temp 98.6°F | Ht 63.0 in | Wt 246.0 lb

## 2020-12-30 DIAGNOSIS — K2100 Gastro-esophageal reflux disease with esophagitis, without bleeding: Secondary | ICD-10-CM

## 2020-12-30 DIAGNOSIS — I10 Essential (primary) hypertension: Secondary | ICD-10-CM | POA: Diagnosis not present

## 2020-12-30 DIAGNOSIS — Z1211 Encounter for screening for malignant neoplasm of colon: Secondary | ICD-10-CM

## 2020-12-30 DIAGNOSIS — Z0000 Encounter for general adult medical examination without abnormal findings: Secondary | ICD-10-CM | POA: Diagnosis not present

## 2020-12-30 NOTE — Patient Instructions (Signed)
It was great seeing you today   We will follow up with you regarding your blood work   Please start exercise, this will help you lose weight

## 2020-12-30 NOTE — Progress Notes (Signed)
Subjective:    Patient ID: Janet Sanchez, female    DOB: Mar 05, 1971, 50 y.o.   MRN: [id number]  HPI Patient presents for yearly preventative medicine examination. 50 year old female who  has a past medical history of Enlarged tonsils, Herpes, Hypertension, Migraines, Morbid obesity ([place]), and Vasovagal syncope (10/24/2017).  HTN -managed by nephrology.  Difficult to control with multiple blood pressure medications with reported allergies/side effects.  Currently prescribed Norvasc 10 mg daily and irbesartan 300 mg daily.  BP Readings from Last 3 Encounters:  12/30/20 140/82  12/23/20 140/80  07/26/20 (!) 144/90   GERD- prescribed Prilosec 20 mg daily   Obesity - She has not been able to lose much weight, she does not eat out much and cooks meals at home, but does not exercise.   All immunizations and health maintenance protocols were reviewed with the patient and needed orders were placed.  Appropriate screening laboratory values were ordered for the patient including screening of hyperlipidemia, renal function and hepatic function.  Medication reconciliation,  past medical history, social history, problem list and allergies were reviewed in detail with the patient  Goals were established with regard to weight loss, exercise, and  diet in compliance with medications.  Wt Readings from Last 3 Encounters:  12/30/20 246 lb (111.6 kg)  12/23/20 246 lb 12.8 oz (111.9 kg)  07/26/20 242 lb (109.8 kg)   She is due for routine colon cancer screening. She is up date on routine mammogram and pap.    Review of Systems  Constitutional: Negative.   HENT: Negative.    Eyes: Negative.   Respiratory: Negative.    Cardiovascular: Negative.   Gastrointestinal: Negative.   Endocrine: Negative.   Genitourinary: Negative.   Musculoskeletal: Negative.   Skin: Negative.   Allergic/Immunologic: Negative.   Neurological: Negative.   Hematological: Negative.   Psychiatric/Behavioral:  Negative.    All other systems reviewed and are negative.  Past Medical History:  Diagnosis Date   Enlarged tonsils    Herpes    Hypertension    Migraines    Morbid obesity ([place])    Vasovagal syncope 10/24/2017    Social History   Socioeconomic History   Marital status: Married    Spouse name: Not on file   Number of children: Not on file   Years of education: Not on file   Highest education level: Not on file  Occupational History   Not on file  Tobacco Use   Smoking status: Never   Smokeless tobacco: Never  Vaping Use   Vaping Use: Never used  Substance and Sexual Activity   Alcohol use: No   Drug use: No   Sexual activity: Yes    Birth control/protection: Surgical  Other Topics Concern   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, hospital   Married for 24 years    Five children all live locally   Social Determinants of Radio broadcast assistant Strain: Not on file  Food Insecurity: Not on file  Transportation Needs: Not on file  Physical Activity: Not on file  Stress: Not on file  Social Connections: Not on file  Intimate Partner Violence: Not on file    Past Surgical History:  Procedure Laterality Date   TUBAL LIGATION      Family History  Problem Relation Age of Onset   Heart attack Mother 73   Hypertension Father    Heart attack Father 17  Hypertension Sister    Hypertension Brother    Arrhythmia Brother    Hypertension Sister     Allergies  Allergen Reactions   Potassium-Containing Compounds Other (See Comments)    Severe cramps caused her to pass out.   Hctz [Hydrochlorothiazide] Other (See Comments)    Renal impairment    Lisinopril Other (See Comments)    Headache     Current Outpatient Medications on File Prior to Visit  Medication Sig Dispense Refill   amLODipine (NORVASC) 10 MG tablet Take 10 mg by mouth daily.     aspirin EC 81 MG tablet Take 81 mg by mouth daily.      aspirin-acetaminophen-caffeine (EXCEDRIN MIGRAINE) 250-250-65 MG tablet Take by mouth every 6 (six) hours as needed for headache.     cetirizine (ZYRTEC) 10 MG tablet Take 10 mg by mouth daily.     fluticasone (FLONASE) 50 MCG/ACT nasal spray Place 1 spray into both nostrils daily. 16 g 0   gabapentin (NEURONTIN) 100 MG capsule Take 2 capsules (200 mg total) by mouth at bedtime. 180 capsule 3   irbesartan (AVAPRO) 300 MG tablet Take 300 mg by mouth daily.     Multiple Vitamin (MULTIVITAMIN) tablet Take 1 tablet by mouth daily.     omeprazole (PRILOSEC) 20 MG capsule TAKE 1 CAPSULE BY MOUTH EVERY DAY 90 capsule 1   valACYclovir (VALTREX) 1000 MG tablet take 1 tablet by mouth 2 times daily for 10 days as needed for flare 90 tablet 0   VENTOLIN HFA 108 (90 Base) MCG/ACT inhaler INHALE 2 PUFFS INTO THE LUNGS EVERY 6 HOURS AS NEEDED FOR FOR WHEEZING OR SHORTNESS OF BREATH 18 g 2   No current facility-administered medications on file prior to visit.    BP 140/82   Pulse 81   Temp 98.6 F (37 C) (Oral)   Ht 5\' 3"  (1.6 m)   Wt 246 lb (111.6 kg)   LMP 12/29/2020   SpO2 98%   BMI 43.58 kg/m       Objective:   Physical Exam Vitals and nursing note reviewed.  Constitutional:      General: She is not in acute distress.    Appearance: Normal appearance. She is well-developed. She is obese. She is not ill-appearing.  HENT:     Head: Normocephalic and atraumatic.     Right Ear: Tympanic membrane, ear canal and external ear normal. There is no impacted cerumen.     Left Ear: Tympanic membrane, ear canal and external ear normal. There is no impacted cerumen.     Nose: Nose normal. No congestion or rhinorrhea.     Mouth/Throat:     Mouth: Mucous membranes are moist.     Pharynx: Oropharynx is clear. No oropharyngeal exudate or posterior oropharyngeal erythema.  Eyes:     General:        Right eye: No discharge.        Left eye: No discharge.     Extraocular Movements: Extraocular movements  intact.     Conjunctiva/sclera: Conjunctivae normal.     Pupils: Pupils are equal, round, and reactive to light.  Neck:     Thyroid: No thyromegaly.     Vascular: No carotid bruit.     Trachea: No tracheal deviation.  Cardiovascular:     Rate and Rhythm: Normal rate and regular rhythm.     Pulses: Normal pulses.     Heart sounds: Normal heart sounds. No murmur heard.   No friction rub. No  gallop.  Pulmonary:     Effort: Pulmonary effort is normal. No respiratory distress.     Breath sounds: Normal breath sounds. No stridor. No wheezing, rhonchi or rales.  Chest:     Chest wall: No tenderness.  Abdominal:     General: Abdomen is flat. Bowel sounds are normal. There is no distension.     Palpations: Abdomen is soft. There is no mass.     Tenderness: There is no abdominal tenderness. There is no right CVA tenderness, left CVA tenderness, guarding or rebound.     Hernia: No hernia is present.  Musculoskeletal:        General: No swelling, tenderness, deformity or signs of injury. Normal range of motion.     Cervical back: Normal range of motion and neck supple.     Right lower leg: No edema.     Left lower leg: No edema.  Lymphadenopathy:     Cervical: No cervical adenopathy.  Skin:    General: Skin is warm and dry.     Coloration: Skin is not jaundiced or pale.     Findings: No bruising, erythema, lesion or rash.  Neurological:     General: No focal deficit present.     Mental Status: She is alert and oriented to person, place, and time.     Cranial Nerves: No cranial nerve deficit.     Sensory: No sensory deficit.     Motor: No weakness.     Coordination: Coordination normal.     Gait: Gait normal.     Deep Tendon Reflexes: Reflexes normal.  Psychiatric:        Mood and Affect: Mood normal.        Behavior: Behavior normal.        Thought Content: Thought content normal.        Judgment: Judgment normal.      Assessment & Plan:   1. Routine general medical examination  at a health care facility - Follow up in one year or sooner if needed - CBC with Differential/Platelet; Future - Comprehensive metabolic panel; Future - Lipid panel; Future - TSH; Future - Hemoglobin A1c; Future - Hemoglobin A1c - TSH - Lipid panel - Comprehensive metabolic panel - CBC with Differential/Platelet  2. Essential hypertension - Follow up with Nephrology as directed - CBC with Differential/Platelet; Future - Comprehensive metabolic panel; Future - Lipid panel; Future - TSH; Future - Hemoglobin A1c; Future - Hemoglobin A1c - TSH - Lipid panel - Comprehensive metabolic panel - CBC with Differential/Platelet  3. Gastroesophageal reflux disease with esophagitis without hemorrhage - Continue with PPI - CBC with Differential/Platelet; Future - Comprehensive metabolic panel; Future - Lipid panel; Future - TSH; Future - Hemoglobin A1c; Future - Hemoglobin A1c - TSH - Lipid panel - Comprehensive metabolic panel - CBC with Differential/Platelet  4. Morbid obesity ([place]) - Encouraged routine exercise - CBC with Differential/Platelet; Future - Comprehensive metabolic panel; Future - Lipid panel; Future - TSH; Future - Hemoglobin A1c; Future - Hemoglobin A1c - TSH - Lipid panel - Comprehensive metabolic panel - CBC with Differential/Platelet  5. Colon cancer screening  - Ambulatory referral to Gastroenterology  [name], NP

## 2020-12-31 LAB — CBC WITH DIFFERENTIAL/PLATELET
Basophils Absolute: 0.1 10*3/uL (ref 0.0–0.1)
Basophils Relative: 2.3 % (ref 0.0–3.0)
Eosinophils Absolute: 0.1 10*3/uL (ref 0.0–0.7)
Eosinophils Relative: 1.4 % (ref 0.0–5.0)
HCT: 32.2 % — ABNORMAL LOW (ref 36.0–46.0)
Hemoglobin: 10.8 g/dL — ABNORMAL LOW (ref 12.0–15.0)
Lymphocytes Relative: 35.9 % (ref 12.0–46.0)
Lymphs Abs: 1.7 10*3/uL (ref 0.7–4.0)
MCHC: 33.4 g/dL (ref 30.0–36.0)
MCV: 82.5 fl (ref 78.0–100.0)
Monocytes Absolute: 0.4 10*3/uL (ref 0.1–1.0)
Monocytes Relative: 8.2 % (ref 3.0–12.0)
Neutro Abs: 2.4 10*3/uL (ref 1.4–7.7)
Neutrophils Relative %: 52.2 % (ref 43.0–77.0)
Platelets: 278 10*3/uL (ref 150.0–400.0)
RBC: 3.91 Mil/uL (ref 3.87–5.11)
RDW: 15.2 % (ref 11.5–15.5)
WBC: 4.6 10*3/uL (ref 4.0–10.5)

## 2020-12-31 LAB — COMPREHENSIVE METABOLIC PANEL
ALT: 14 U/L (ref 0–35)
AST: 16 U/L (ref 0–37)
Albumin: 4.3 g/dL (ref 3.5–5.2)
Alkaline Phosphatase: 76 U/L (ref 39–117)
BUN: 17 mg/dL (ref 6–23)
CO2: 25 mEq/L (ref 19–32)
Calcium: 9.3 mg/dL (ref 8.4–10.5)
Chloride: 102 mEq/L (ref 96–112)
Creatinine, Ser: 1.04 mg/dL (ref 0.40–1.20)
GFR: 62.95 mL/min (ref >60.00)
Glucose, Bld: 72 mg/dL (ref 70–99)
Potassium: 3.7 mEq/L (ref 3.5–5.1)
Sodium: 138 mEq/L (ref 135–145)
Total Bilirubin: 0.7 mg/dL (ref 0.2–1.2)
Total Protein: 7.9 g/dL (ref 6.0–8.3)

## 2020-12-31 LAB — LIPID PANEL
Cholesterol: 217 mg/dL — ABNORMAL HIGH (ref 0–200)
HDL: 68.2 mg/dL (ref >39.00)
LDL Cholesterol: 137 mg/dL — ABNORMAL HIGH (ref 0–99)
NonHDL: 149.08
Total CHOL/HDL Ratio: 3
Triglycerides: 58 mg/dL (ref 0.0–149.0)
VLDL: 11.6 mg/dL (ref 0.0–40.0)

## 2020-12-31 LAB — TSH: TSH: 1.95 u[IU]/mL (ref 0.35–4.50)

## 2020-12-31 LAB — HEMOGLOBIN A1C: Hgb A1c MFr Bld: 5.6 % (ref 4.6–6.5)

## 2021-01-13 ENCOUNTER — Ambulatory Visit (INDEPENDENT_AMBULATORY_CARE_PROVIDER_SITE_OTHER): Payer: 59 | Admitting: Podiatry

## 2021-01-13 ENCOUNTER — Ambulatory Visit: Payer: 59

## 2021-01-13 ENCOUNTER — Other Ambulatory Visit: Payer: Self-pay

## 2021-01-13 DIAGNOSIS — M7751 Other enthesopathy of right foot: Secondary | ICD-10-CM | POA: Diagnosis not present

## 2021-01-13 DIAGNOSIS — M21612 Bunion of left foot: Secondary | ICD-10-CM | POA: Diagnosis not present

## 2021-01-13 DIAGNOSIS — M21611 Bunion of right foot: Secondary | ICD-10-CM | POA: Diagnosis not present

## 2021-01-13 NOTE — Progress Notes (Signed)
Subjective:   Patient ID: Janet Sanchez, female   DOB: 50 y.o.   MRN: [id number]   HPI 50 year old female presents the office today for concerns of bunions.  She states that she has a bunion on both feet but the right side is much more painful than the left side.  She states that after she is on her feet all days when she gets discomfort or she sleeps on her stomach putting pressure on the bunions.  She is not interested in any surgical intervention.  She tries to wear shoes with arch supports and she is asking about over-the-counter inserts that she can try as well.  She denies any recent treatment.   Review of Systems  All other systems reviewed and are negative.  Past Medical History:  Diagnosis Date   Enlarged tonsils    Herpes    Hypertension    Migraines    Morbid obesity ([place])    Vasovagal syncope 10/24/2017    Past Surgical History:  Procedure Laterality Date   TUBAL LIGATION       Current Outpatient Medications:    amLODipine (NORVASC) 10 MG tablet, Take 10 mg by mouth daily., Disp: , Rfl:    aspirin EC 81 MG tablet, Take 81 mg by mouth daily., Disp: , Rfl:    aspirin-acetaminophen-caffeine (EXCEDRIN MIGRAINE) 250-250-65 MG tablet, Take by mouth every 6 (six) hours as needed for headache., Disp: , Rfl:    cetirizine (ZYRTEC) 10 MG tablet, Take 10 mg by mouth daily., Disp: , Rfl:    fluticasone (FLONASE) 50 MCG/ACT nasal spray, Place 1 spray into both nostrils daily., Disp: 16 g, Rfl: 0   gabapentin (NEURONTIN) 100 MG capsule, Take 2 capsules (200 mg total) by mouth at bedtime., Disp: 180 capsule, Rfl: 3   irbesartan (AVAPRO) 300 MG tablet, Take 300 mg by mouth daily., Disp: , Rfl:    Multiple Vitamin (MULTIVITAMIN) tablet, Take 1 tablet by mouth daily., Disp: , Rfl:    omeprazole (PRILOSEC) 20 MG capsule, TAKE 1 CAPSULE BY MOUTH EVERY DAY, Disp: 90 capsule, Rfl: 1   valACYclovir (VALTREX) 1000 MG tablet, take 1 tablet by mouth 2 times daily for 10 days as needed for  flare, Disp: 90 tablet, Rfl: 0   VENTOLIN HFA 108 (90 Base) MCG/ACT inhaler, INHALE 2 PUFFS INTO THE LUNGS EVERY 6 HOURS AS NEEDED FOR FOR WHEEZING OR SHORTNESS OF BREATH, Disp: 18 g, Rfl: 2  Allergies  Allergen Reactions   Potassium-Containing Compounds Other (See Comments)    Severe cramps caused her to pass out.   Hctz [Hydrochlorothiazide] Other (See Comments)    Renal impairment    Lisinopril Other (See Comments)    Headache           Objective:  Physical Exam  General: AAO x3, NAD  Dermatological: Skin is warm, dry and supple bilateral.  There are no open sores, no preulcerative lesions, no rash or signs of infection present.  Vascular: Dorsalis Pedis artery and Posterior Tibial artery pedal pulses are 2/4 bilateral with immedate capillary fill time.  There is no pain with calf compression, swelling, warmth, erythema.   Neruologic: Grossly intact via light touch bilateral.   Musculoskeletal: Moderate bunions are present bilaterally right side worse than left.  Mild distress in the bunion site.  No pain with MPJ range of motion or crepitation.  No other areas of discomfort identified.  Upon weightbearing evaluation she does supinate.  Muscular strength 5/5 in all groups tested bilateral.  Gait: Unassisted, Nonantalgic.       Assessment:   Bunion right >> left     Plan:  -Treatment options discussed including all alternatives, risks, and complications -Etiology of symptoms were discussed -Independent reviewed the previous x-rays.  These were nonweightbearing but still able to identify the bunion.  She is not interested in surgical intervention so did not repeat these.  We discussed conservative options.  Discussed Voltaren gel that she can use.  Discussed shoes and good arch supports.  Dispensed offloading pads.  Steroid injection if needed in the future.  Trula Slade DPM

## 2021-01-13 NOTE — Patient Instructions (Signed)

## 2021-01-20 ENCOUNTER — Telehealth: Payer: Self-pay | Admitting: Adult Health

## 2021-01-20 MED ORDER — ATORVASTATIN CALCIUM 10 MG PO TABS: 10.0000 mg | ORAL_TABLET | Freq: Every day | ORAL | 3 refills | Status: DC

## 2021-01-20 NOTE — Addendum Note (Signed)
Addended by: Gwenyth Ober R on: 01/20/2021 01:13 PM   Modules accepted: Orders

## 2021-01-20 NOTE — Telephone Encounter (Signed)
This has been taking care of.

## 2021-01-20 NOTE — Telephone Encounter (Signed)
Pt is returning the call to the office 

## 2021-01-27 ENCOUNTER — Encounter: Payer: Self-pay | Admitting: Adult Health

## 2021-01-28 ENCOUNTER — Other Ambulatory Visit: Payer: Self-pay | Admitting: Adult Health

## 2021-01-28 MED ORDER — SIMVASTATIN 5 MG PO TABS: 5.0000 mg | ORAL_TABLET | Freq: Every day | ORAL | 0 refills | Status: DC

## 2021-01-28 NOTE — Telephone Encounter (Signed)
Please advise 

## 2021-03-04 ENCOUNTER — Other Ambulatory Visit: Payer: Self-pay | Admitting: Adult Health

## 2021-03-16 ENCOUNTER — Encounter: Payer: Self-pay | Admitting: Adult Health

## 2021-03-17 NOTE — Telephone Encounter (Signed)
Please advise 

## 2021-04-08 ENCOUNTER — Other Ambulatory Visit: Payer: Self-pay | Admitting: Adult Health

## 2021-04-12 ENCOUNTER — Encounter: Payer: Self-pay | Admitting: Adult Health

## 2021-04-12 ENCOUNTER — Other Ambulatory Visit: Payer: Self-pay | Admitting: Adult Health

## 2021-04-12 NOTE — Telephone Encounter (Signed)
Please advise 

## 2021-04-13 ENCOUNTER — Encounter: Payer: Self-pay | Admitting: Adult Health

## 2021-04-13 ENCOUNTER — Other Ambulatory Visit: Payer: Self-pay | Admitting: Adult Health

## 2021-04-13 MED ORDER — EZETIMIBE 10 MG PO TABS: 10.0000 mg | ORAL_TABLET | Freq: Every day | ORAL | 3 refills | Status: DC

## 2021-05-09 ENCOUNTER — Other Ambulatory Visit: Payer: Self-pay | Admitting: Adult Health

## 2021-05-17 ENCOUNTER — Encounter: Payer: Self-pay | Admitting: Adult Health

## 2021-05-18 ENCOUNTER — Other Ambulatory Visit: Payer: Self-pay | Admitting: Adult Health

## 2021-05-18 ENCOUNTER — Encounter: Payer: Self-pay | Admitting: Adult Health

## 2021-05-18 NOTE — Telephone Encounter (Signed)
Please advise 

## 2021-05-18 NOTE — Progress Notes (Signed)
error 

## 2021-06-07 ENCOUNTER — Ambulatory Visit (INDEPENDENT_AMBULATORY_CARE_PROVIDER_SITE_OTHER): Payer: 59 | Admitting: Adult Health

## 2021-06-07 ENCOUNTER — Encounter: Payer: Self-pay | Admitting: Adult Health

## 2021-06-07 VITALS — BP 140/88 | HR 82 | Temp 98.6°F | Ht 63.0 in | Wt 248.0 lb

## 2021-06-07 DIAGNOSIS — Z1211 Encounter for screening for malignant neoplasm of colon: Secondary | ICD-10-CM | POA: Diagnosis not present

## 2021-06-07 DIAGNOSIS — E781 Pure hyperglyceridemia: Secondary | ICD-10-CM | POA: Diagnosis not present

## 2021-06-07 NOTE — Progress Notes (Signed)
Subjective:    Patient ID: Janet Sanchez, female    DOB: Oct 27, 1970, 50 y.o.   MRN: [id number]  HPI 50 year old female who  has a past medical history of Enlarged tonsils, Herpes, Hypertension, Migraines, Morbid obesity ([place]), and Vasovagal syncope (10/24/2017).  She presents to the office today for follow up regarding hyperlipidemia   She has been trailed on multiple statins including Atorvastatin and Simvastatin which caused myalgia. She was then placed on Zetia which also caused myalgia and she stopped the medication about three weeks ago and myalgia resolved.   Lab Results  Component Value Date   CHOL 217 (H) 12/30/2020   HDL 68.20 12/30/2020   LDLCALC 137 (H) 12/30/2020   TRIG 58.0 12/30/2020   CHOLHDL 3 12/30/2020   She also never called to schedule her colonoscopy. She would like to do cologuard   Review of Systems See HPI   Past Medical History:  Diagnosis Date   Enlarged tonsils    Herpes    Hypertension    Migraines    Morbid obesity ([place])    Vasovagal syncope 10/24/2017    Social History   Socioeconomic History   Marital status: Married    Spouse name: Not on file   Number of children: Not on file   Years of education: Not on file   Highest education level: Not on file  Occupational History   Not on file  Tobacco Use   Smoking status: Never   Smokeless tobacco: Never  Vaping Use   Vaping Use: Never used  Substance and Sexual Activity   Alcohol use: No   Drug use: No   Sexual activity: Yes    Birth control/protection: Surgical  Other Topics Concern   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, hospital   Married for 24 years    Five children all live locally   Social Determinants of Radio broadcast assistant Strain: Not on file  Food Insecurity: Not on file  Transportation Needs: Not on file  Physical Activity: Not on file  Stress: Not on file  Social Connections: Not on file  Intimate Partner Violence: Not  on file    Past Surgical History:  Procedure Laterality Date   TUBAL LIGATION      Family History  Problem Relation Age of Onset   Heart attack Mother 37   Hypertension Father    Heart attack Father 6   Hypertension Sister    Hypertension Brother    Arrhythmia Brother    Hypertension Sister     Allergies  Allergen Reactions   Potassium-Containing Compounds Other (See Comments)    Severe cramps caused her to pass out.   Atorvastatin     Myalgia    Hctz [Hydrochlorothiazide] Other (See Comments)    Renal impairment    Simvastatin     Myalgia    Zetia [Ezetimibe] Other (See Comments)    Myalgia    Lisinopril Other (See Comments)    Headache     Current Outpatient Medications on File Prior to Visit  Medication Sig Dispense Refill   amLODipine (NORVASC) 10 MG tablet Take 10 mg by mouth daily.     aspirin EC 81 MG tablet Take 81 mg by mouth daily.     aspirin-acetaminophen-caffeine (EXCEDRIN MIGRAINE) 250-250-65 MG tablet Take by mouth every 6 (six) hours as needed for headache.     cetirizine (ZYRTEC) 10 MG tablet Take 10  mg by mouth daily.     fluticasone (FLONASE) 50 MCG/ACT nasal spray Place 1 spray into both nostrils daily. 16 g 0   gabapentin (NEURONTIN) 100 MG capsule Take 2 capsules (200 mg total) by mouth at bedtime. 180 capsule 3   irbesartan (AVAPRO) 300 MG tablet Take 300 mg by mouth daily.     Multiple Vitamin (MULTIVITAMIN) tablet Take 1 tablet by mouth daily.     omeprazole (PRILOSEC) 20 MG capsule TAKE 1 CAPSULE BY MOUTH EVERY DAY 90 capsule 1   valACYclovir (VALTREX) 1000 MG tablet take 1 tablet by mouth 2 times daily for 10 days as needed for flare 90 tablet 0   VENTOLIN HFA 108 (90 Base) MCG/ACT inhaler INHALE 2 PUFFS INTO THE LUNGS EVERY 6 HOURS AS NEEDED FOR FOR WHEEZING OR SHORTNESS OF BREATH 18 g 2   No current facility-administered medications on file prior to visit.    BP (!) 170/96   Pulse 97   Temp 98.6 F (37 C) (Oral)   Ht 5\' 3"  (1.6 m)    Wt 248 lb (112.5 kg)   LMP 05/21/2021   BMI 43.93 kg/m       Objective:   Physical Exam Vitals and nursing note reviewed.  Constitutional:      Appearance: Normal appearance.  Cardiovascular:     Rate and Rhythm: Normal rate and regular rhythm.     Pulses: Normal pulses.     Heart sounds: Normal heart sounds.  Pulmonary:     Effort: Pulmonary effort is normal.     Breath sounds: Normal breath sounds.  Musculoskeletal:        General: Normal range of motion.  Skin:    General: Skin is warm and dry.     Capillary Refill: Capillary refill takes less than 2 seconds.  Neurological:     General: No focal deficit present.     Mental Status: She is alert and oriented to person, place, and time.  Psychiatric:        Mood and Affect: Mood normal.        Behavior: Behavior normal.        Thought Content: Thought content normal.        Judgment: Judgment normal.      Assessment & Plan:   1. Pure hypertriglyceridemia  - Lipid panel; Future - likely refer to lipid clinic    2. Colon cancer screening  - Cologuard   [name], NP

## 2021-07-06 ENCOUNTER — Other Ambulatory Visit (INDEPENDENT_AMBULATORY_CARE_PROVIDER_SITE_OTHER): Payer: 59

## 2021-07-06 DIAGNOSIS — E781 Pure hyperglyceridemia: Secondary | ICD-10-CM | POA: Diagnosis not present

## 2021-07-06 LAB — LIPID PANEL
Cholesterol: 209 mg/dL — ABNORMAL HIGH (ref 0–200)
HDL: 72.9 mg/dL (ref >39.00)
LDL Cholesterol: 124 mg/dL — ABNORMAL HIGH (ref 0–99)
NonHDL: 136.39
Total CHOL/HDL Ratio: 3
Triglycerides: 64 mg/dL (ref 0.0–149.0)
VLDL: 12.8 mg/dL (ref 0.0–40.0)

## 2021-07-08 ENCOUNTER — Telehealth: Payer: Self-pay | Admitting: Adult Health

## 2021-07-08 NOTE — Telephone Encounter (Signed)
Patient is returning Mykal call. Informed patient that Mykal would give her a phone call back.

## 2021-08-01 ENCOUNTER — Other Ambulatory Visit: Payer: Self-pay | Admitting: Adult Health

## 2021-08-01 DIAGNOSIS — R238 Other skin changes: Secondary | ICD-10-CM

## 2021-09-10 IMAGING — US US BREAST*R* LIMITED INC AXILLA
1 series · 8 of 8 positions shown · non-contrast
Comparison: Previous exam(s).

CLINICAL DATA: 48-year-old female for further evaluation of
possible RIGHT breast mass on screening mammogram. Also complains of
bilateral nipple itching.

EXAM:
DIGITAL DIAGNOSTIC BILATERAL MAMMOGRAM WITH CAD AND TOMO
ULTRASOUND RIGHT BREAST

[Series 1: us breast*right* limited inc axilla · 0.06mm/px · 8 of 8 slices shown]
[im 1/8]
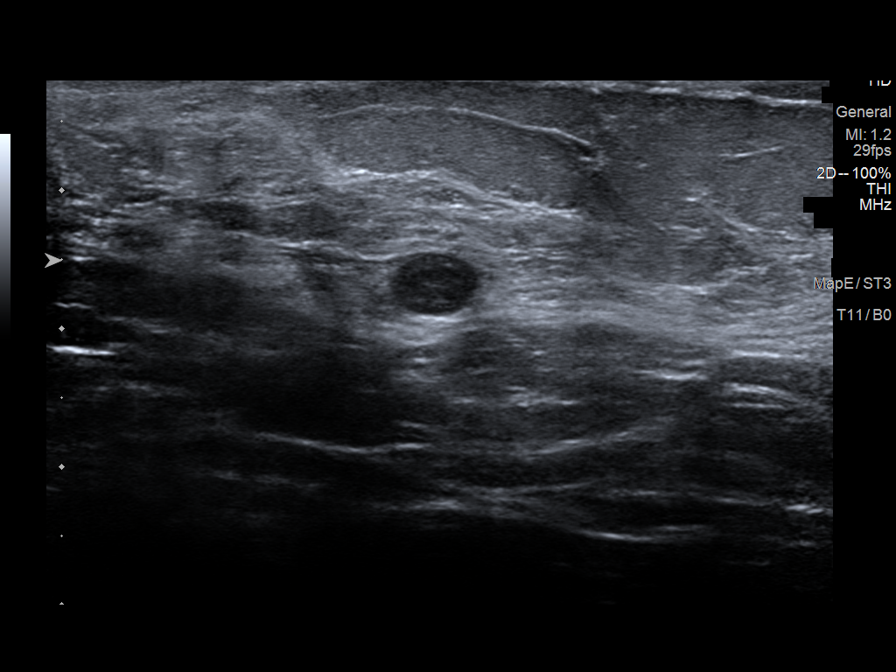
[im 2/8]
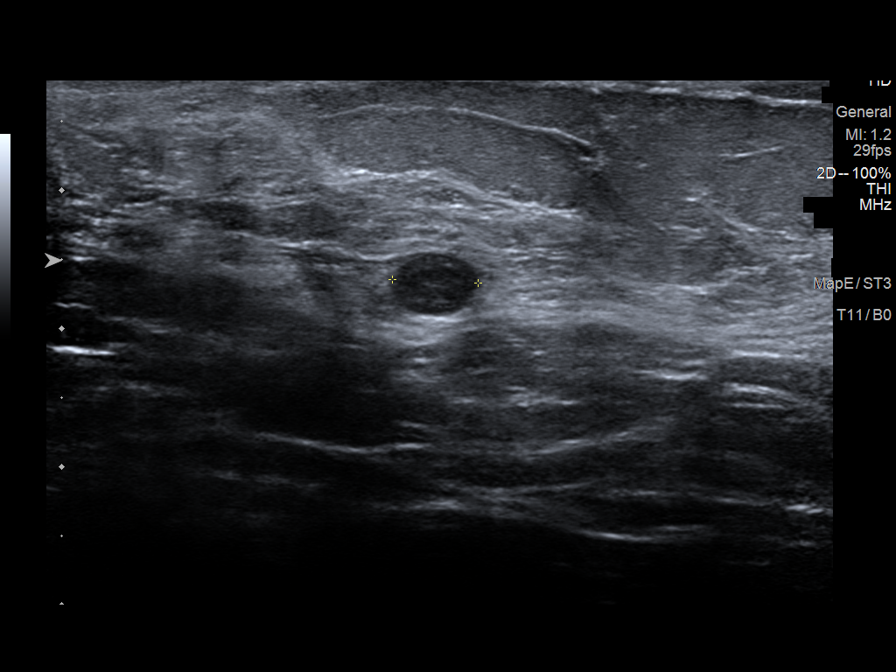
[im 3/8]
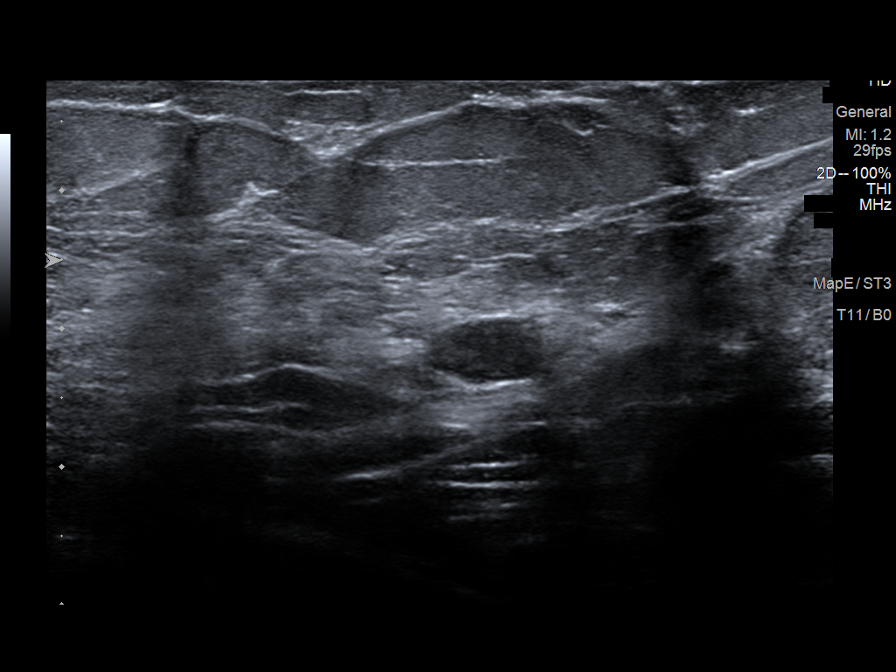
[im 4/8]
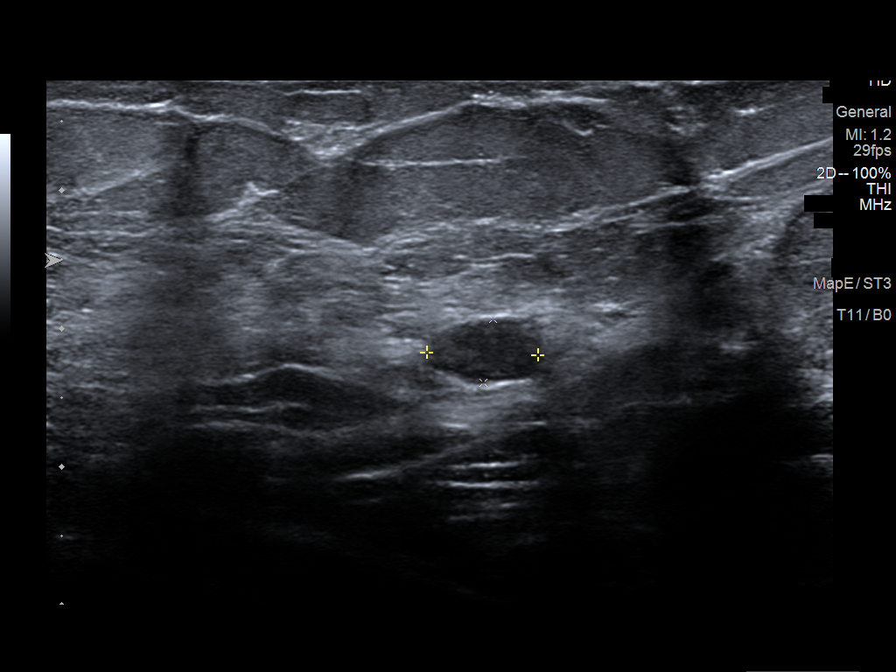
[im 5/8]
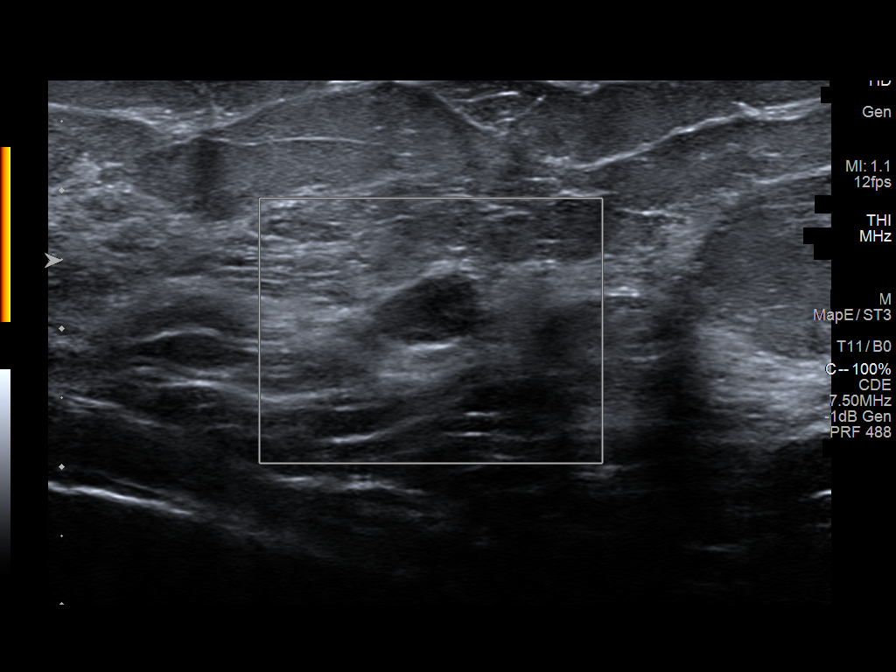
[im 6/8]
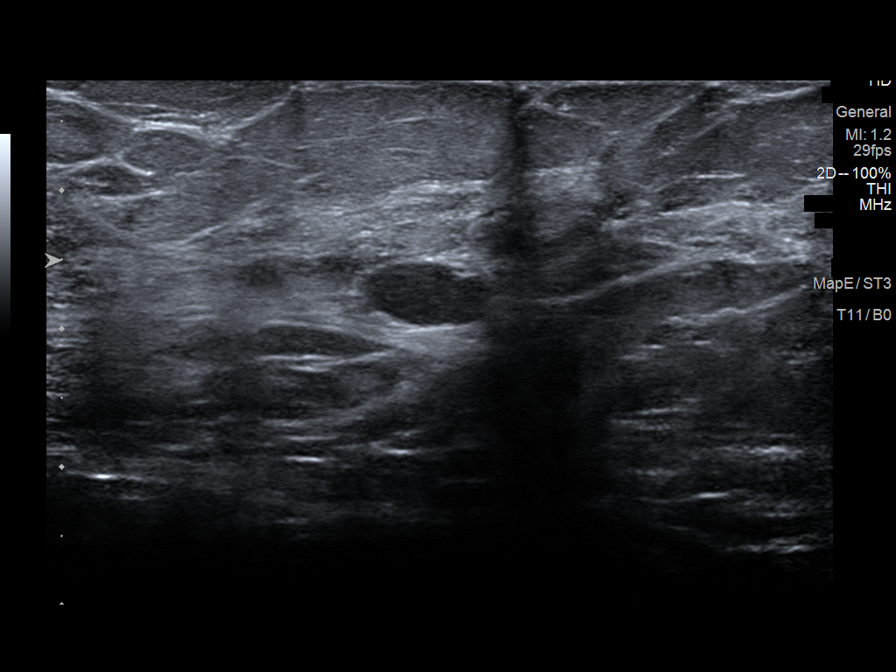
[im 7/8]
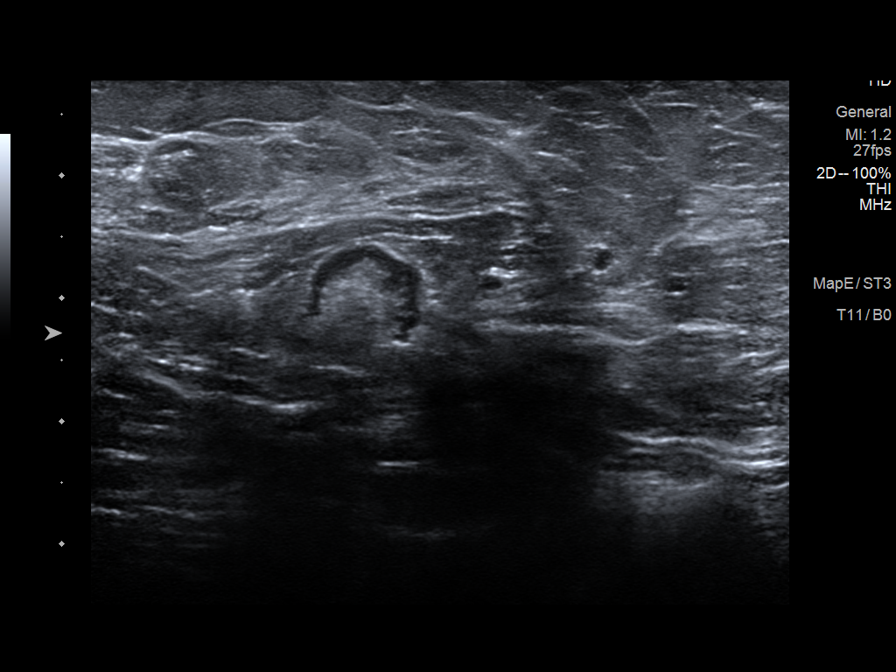
[im 8/8]
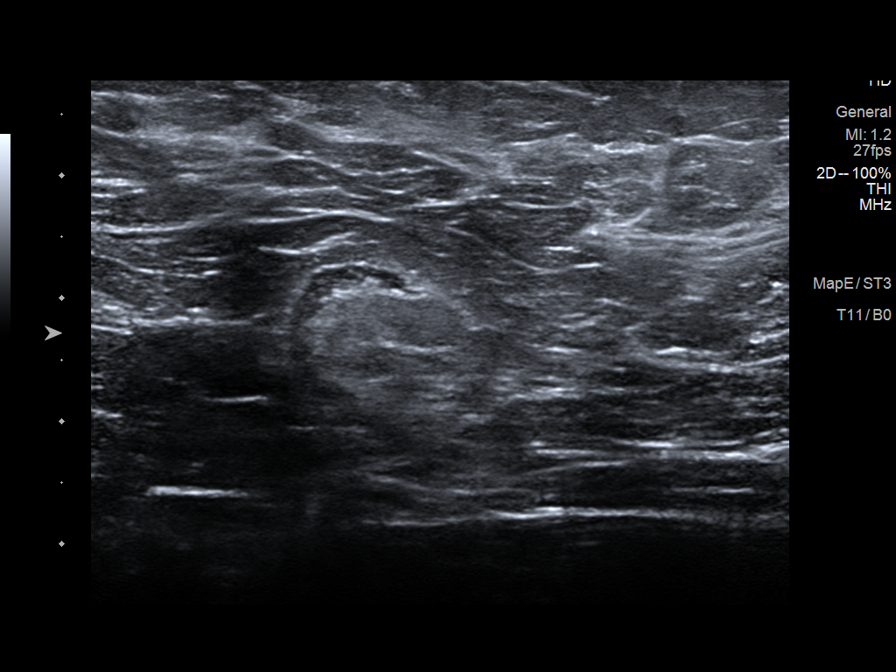

[8 of 8 positions shown; findings below may reference images not displayed]

ACR Breast Density Category c: The breast tissue is heterogeneously
dense, which may obscure small masses.
FINDINGS: 2D/3D full field views of both breasts demonstrate a persistent
circumscribed oval mass within the central RIGHT breast on the MLO
view only.

No other abnormalities noted within either breast.

Mammographic images were processed with CAD.

On physical exam, the nipple-areolar complexes appear normal
bilaterally.

Targeted ultrasound is performed, showing a 0.8 x 0.5 x 0.6 cm
circumscribed oval hypoechoic parallel mass at the 8 o'clock
position of the RIGHT breast 3 cm from the nipple, corresponding to
the mammographic finding.

No abnormal RIGHT axillary lymph nodes are identified.
IMPRESSION: 1. Likely benign 0.8 cm mass within the LOWER OUTER RIGHT breast. We
discussed management options including excision, ultrasound-guided
core biopsy, and close follow-up. Follow-up ultrasound is
recommended at 6, 12,and 24 months to assess stability. The patient
concurs with this plan.
2. No other mammographic abnormalities identified.

RECOMMENDATION:
RIGHT breast ultrasound in 6 months.

I have discussed the findings, causes of breast discomfort/itching
and recommendations with the patient. If applicable, a reminder
letter will be sent to the patient regarding the next appointment.

BI-RADS CATEGORY  3: Probably benign.

## 2021-10-05 ENCOUNTER — Telehealth: Payer: Self-pay

## 2021-10-05 NOTE — Telephone Encounter (Signed)
--  Caller states she is Janet Sanchez at [employer] pt needs a refill ?on irbesartan 300 mg qd. ?---advised caller that note will be sent concerning ?refill. Verbalized understanding ? ?Attempted to contact patient to determine if she was supposed to still be on this medication. Last OV on 06/07/21 did not mention medications for HTN & to f/u with nephrology. ?

## 2021-10-07 NOTE — Telephone Encounter (Signed)
Called pharmacist and she said the issue has been handled. Pt picked up Rx yesterday.  ?

## 2021-10-31 ENCOUNTER — Other Ambulatory Visit: Payer: Self-pay | Admitting: Adult Health

## 2021-11-15 ENCOUNTER — Encounter: Payer: Self-pay | Admitting: Adult Health

## 2021-11-21 ENCOUNTER — Other Ambulatory Visit: Payer: Self-pay | Admitting: Adult Health

## 2021-11-21 DIAGNOSIS — J069 Acute upper respiratory infection, unspecified: Secondary | ICD-10-CM

## 2021-11-30 ENCOUNTER — Encounter: Payer: Self-pay | Admitting: Adult Health

## 2021-11-30 ENCOUNTER — Ambulatory Visit (INDEPENDENT_AMBULATORY_CARE_PROVIDER_SITE_OTHER): Payer: 59 | Admitting: Adult Health

## 2021-11-30 VITALS — BP 132/80 | HR 73 | Temp 98.5°F | Ht 63.0 in | Wt 246.0 lb

## 2021-11-30 DIAGNOSIS — E785 Hyperlipidemia, unspecified: Secondary | ICD-10-CM | POA: Diagnosis not present

## 2021-11-30 DIAGNOSIS — D509 Iron deficiency anemia, unspecified: Secondary | ICD-10-CM | POA: Diagnosis not present

## 2021-11-30 LAB — LIPID PANEL
Cholesterol: 198 mg/dL (ref 0–200)
HDL: 65.4 mg/dL (ref >39.00)
LDL Cholesterol: 121 mg/dL — ABNORMAL HIGH (ref 0–99)
NonHDL: 132.54
Total CHOL/HDL Ratio: 3
Triglycerides: 60 mg/dL (ref 0.0–149.0)
VLDL: 12 mg/dL (ref 0.0–40.0)

## 2021-11-30 LAB — IBC + FERRITIN
Ferritin: 8.7 ng/mL — ABNORMAL LOW (ref 10.0–291.0)
Iron: 27 ug/dL — ABNORMAL LOW (ref 42–145)
Saturation Ratios: 5.3 % — ABNORMAL LOW (ref 20.0–50.0)
TIBC: 508.2 ug/dL — ABNORMAL HIGH (ref 250.0–450.0)
Transferrin: 363 mg/dL — ABNORMAL HIGH (ref 212.0–360.0)

## 2021-11-30 LAB — CBC WITH DIFFERENTIAL/PLATELET
Basophils Absolute: 0 10*3/uL (ref 0.0–0.1)
Basophils Relative: 0.6 % (ref 0.0–3.0)
Eosinophils Absolute: 0.1 10*3/uL (ref 0.0–0.7)
Eosinophils Relative: 3.1 % (ref 0.0–5.0)
HCT: 32.6 % — ABNORMAL LOW (ref 36.0–46.0)
Hemoglobin: 10.6 g/dL — ABNORMAL LOW (ref 12.0–15.0)
Lymphocytes Relative: 37.2 % (ref 12.0–46.0)
Lymphs Abs: 1.3 10*3/uL (ref 0.7–4.0)
MCHC: 32.5 g/dL (ref 30.0–36.0)
MCV: 81.2 fl (ref 78.0–100.0)
Monocytes Absolute: 0.3 10*3/uL (ref 0.1–1.0)
Monocytes Relative: 7.5 % (ref 3.0–12.0)
Neutro Abs: 1.8 10*3/uL (ref 1.4–7.7)
Neutrophils Relative %: 51.6 % (ref 43.0–77.0)
Platelets: 299 10*3/uL (ref 150.0–400.0)
RBC: 4.02 Mil/uL (ref 3.87–5.11)
RDW: 16.9 % — ABNORMAL HIGH (ref 11.5–15.5)
WBC: 3.6 10*3/uL — ABNORMAL LOW (ref 4.0–10.5)

## 2021-11-30 NOTE — Progress Notes (Signed)
? ?Subjective:  ? ? Patient ID: Janet Sanchez, female    DOB: [birth year], 51 y.o.   MRN: [id number] ? ?HPI ?51 year old female who  has a past medical history of Enlarged tonsils, Herpes, Hypertension, Migraines, Morbid obesity ([place]), and Vasovagal syncope (10/24/2017). ? ?She was seen by her nephrologist a couple weeks ago and lab work showed iron deficiency anemia.  Her hemoglobin was 9.9 and her ferritin was 9.  She was advised to start taking an over-the-counter iron supplement which she was taking until a couple of days ago when she stopped due to constipation.  She is scheduled to have an iron infusion on May 17 but would like to recheck her labs today. ? ?Additionally, she would like to recheck her cholesterol panel.  In the past she has been tried on multiple statins, each caused myalgia and most recently was on Zetia but stopped this as well because it caused myalgia. ? ?Lab Results  ?Component Value Date  ? CHOL 209 (H) 07/06/2021  ? HDL 72.90 07/06/2021  ? LDLCALC 124 (H) 07/06/2021  ? TRIG 64.0 07/06/2021  ? CHOLHDL 3 07/06/2021  ? ? ? ? ?Review of Systems ?See HPI  ? ?Past Medical History:  ?Diagnosis Date  ? Enlarged tonsils   ? Herpes   ? Hypertension   ? Migraines   ? Morbid obesity ([place])   ? Vasovagal syncope 10/24/2017  ? ? ?Social History  ? ?Socioeconomic History  ? Marital status: Married  ?  Spouse name: Not on file  ? Number of children: Not on file  ? Years of education: Not on file  ? Highest education level: Not on file  ?Occupational History  ? Not on file  ?Tobacco Use  ? Smoking status: Never  ? Smokeless tobacco: Never  ?Vaping Use  ? Vaping Use: Never used  ?Substance and Sexual Activity  ? Alcohol use: No  ? Drug use: No  ? Sexual activity: Yes  ?  Birth control/protection: Surgical  ?Other Topics Concern  ? Not on file  ?Social History Narrative  ? 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 Married for 24 years   ? Five children all live locally  ? ?Social Determinants of Health   ? ?Financial Resource Strain: Not on file  ?Food Insecurity: Not on file  ?Transportation Needs: Not on file  ?Physical Activity: Not on file  ?Stress: Not on file  ?Social Connections: Not on file  ?Intimate Partner Violence: Not on file  ? ? ?Past Surgical History:  ?Procedure Laterality Date  ? TUBAL LIGATION    ? ? ?Family History  ?Problem Relation Age of Onset  ? Heart attack Mother 52  ? Hypertension Father   ? Heart attack Father 73  ? Hypertension Sister   ? Hypertension Brother   ? Arrhythmia Brother   ? Hypertension Sister   ? ? ?Allergies  ?Allergen Reactions  ? Potassium-Containing Compounds Other (See Comments)  ?  Severe cramps caused her to pass out.  ? Atorvastatin   ?  Myalgia   ? Hctz [Hydrochlorothiazide] Other (See Comments)  ?  Renal impairment   ? Simvastatin   ?  Myalgia   ? Zetia [Ezetimibe] Other (See Comments)  ?  Myalgia   ? Lisinopril Other (See Comments)  ?  Headache   ? ? ?Current Outpatient Medications on File Prior to Visit  ?Medication Sig Dispense Refill  ? albuterol (VENTOLIN HFA) 108 (90 Base) MCG/ACT inhaler  INHALE 2 PUFFS INTO THE LUNGS EVERY 6 HOURS AS NEEDED FOR FOR WHEEZING OR SHORTNESS OF BREATH 18 g 2  ? amLODipine (NORVASC) 10 MG tablet Take 10 mg by mouth daily.    ? aspirin EC 81 MG tablet Take 81 mg by mouth daily.    ? aspirin-acetaminophen-caffeine (EXCEDRIN MIGRAINE) 250-250-65 MG tablet Take by mouth every 6 (six) hours as needed for headache.    ? cetirizine (ZYRTEC) 10 MG tablet Take 10 mg by mouth daily.    ? fluticasone (FLONASE) 50 MCG/ACT nasal spray Place 1 spray into both nostrils daily. 16 g 0  ? gabapentin (NEURONTIN) 100 MG capsule Take 2 capsules (200 mg total) by mouth at bedtime. 180 capsule 3  ? irbesartan (AVAPRO) 300 MG tablet Take 300 mg by mouth daily.    ? Multiple Vitamin (MULTIVITAMIN) tablet Take 1 tablet by mouth daily.    ? omeprazole (PRILOSEC) 20 MG capsule Take 1 capsule (20 mg total) by mouth daily. Schedule physical for future refills  90 capsule 1  ? valACYclovir (VALTREX) 1000 MG tablet take 1 tablet by mouth 2 times daily for 10 days as needed for flare 90 tablet 0  ? ezetimibe (ZETIA) 10 MG tablet Take 10 mg by mouth daily.    ? ?No current facility-administered medications on file prior to visit.  ? ? ?BP 132/80   Pulse 73   Temp 98.5 ?F (36.9 ?C) (Oral)   Ht '5\' 3"'$  (1.6 m)   Wt 246 lb (111.6 kg)   LMP 11/26/2021   SpO2 99%   BMI 43.58 kg/m?  ? ? ?   ?Objective:  ? Physical Exam ?Vitals and nursing note reviewed.  ?Constitutional:   ?   Appearance: Normal appearance. She is obese.  ?Skin: ?   General: Skin is warm and dry.  ?Neurological:  ?   General: No focal deficit present.  ?   Mental Status: She is alert and oriented to person, place, and time.  ?Psychiatric:     ?   Mood and Affect: Mood normal.     ?   Behavior: Behavior normal.     ?   Thought Content: Thought content normal.     ?   Judgment: Judgment normal.  ? ?   ?Assessment & Plan:  ?1. Iron deficiency anemia, unspecified iron deficiency anemia type ? ?- CBC with Differential/Platelet; Future ?- IBC + Ferritin; Future ?- Lipid panel; Future ?- Lipid panel ?- IBC + Ferritin ?- CBC with Differential/Platelet ? ?2. Hyperlipidemia, unspecified hyperlipidemia type ? ?- CBC with Differential/Platelet; Future ?- IBC + Ferritin; Future ?- Lipid panel; Future ?- Lipid panel ?- IBC + Ferritin ?- CBC with Differential/Platelet ? ?[name], NP ? ?

## 2021-12-06 ENCOUNTER — Other Ambulatory Visit (HOSPITAL_COMMUNITY): Payer: Self-pay | Admitting: *Deleted

## 2021-12-07 ENCOUNTER — Encounter (HOSPITAL_COMMUNITY)
Admission: RE | Admit: 2021-12-07 | Discharge: 2021-12-07 | Disposition: A | Discharge status: Home / Self Care | Payer: 59 | Source: Ambulatory Visit | Attending: Nephrology | Admitting: Nephrology

## 2021-12-07 DIAGNOSIS — D631 Anemia in chronic kidney disease: Secondary | ICD-10-CM | POA: Diagnosis not present

## 2021-12-07 DIAGNOSIS — N189 Chronic kidney disease, unspecified: Secondary | ICD-10-CM | POA: Diagnosis not present

## 2021-12-07 MED ORDER — SODIUM CHLORIDE 0.9 % IV SOLN
510.0000 mg | INTRAVENOUS | Status: DC
  Administered 2021-12-07: 510 mg via INTRAVENOUS
  Filled 2021-12-07: qty 510

## 2021-12-14 ENCOUNTER — Encounter (HOSPITAL_COMMUNITY): Payer: 59

## 2021-12-28 ENCOUNTER — Encounter (HOSPITAL_COMMUNITY): Payer: 59

## 2022-01-10 ENCOUNTER — Other Ambulatory Visit: Payer: Self-pay | Admitting: Adult Health

## 2022-01-11 ENCOUNTER — Ambulatory Visit (HOSPITAL_COMMUNITY)
Admission: RE | Admit: 2022-01-11 | Discharge: 2022-01-11 | Disposition: A | Discharge status: Home / Self Care | Payer: 59 | Source: Ambulatory Visit | Attending: Nephrology | Admitting: Nephrology

## 2022-01-11 DIAGNOSIS — N189 Chronic kidney disease, unspecified: Secondary | ICD-10-CM | POA: Insufficient documentation

## 2022-01-11 DIAGNOSIS — D631 Anemia in chronic kidney disease: Secondary | ICD-10-CM | POA: Diagnosis not present

## 2022-01-11 MED ORDER — SODIUM CHLORIDE 0.9 % IV SOLN
510.0000 mg | INTRAVENOUS | Status: AC
  Administered 2022-01-11: 510 mg via INTRAVENOUS
  Filled 2022-01-11: qty 510

## 2022-02-21 ENCOUNTER — Encounter: Payer: Self-pay | Admitting: Adult Health

## 2022-02-21 NOTE — Telephone Encounter (Signed)
Please advise 

## 2022-03-21 ENCOUNTER — Ambulatory Visit (INDEPENDENT_AMBULATORY_CARE_PROVIDER_SITE_OTHER): Payer: 59 | Admitting: Adult Health

## 2022-03-21 ENCOUNTER — Encounter: Payer: Self-pay | Admitting: Adult Health

## 2022-03-21 VITALS — BP 138/82 | Temp 98.6°F | Ht 63.0 in | Wt 244.0 lb

## 2022-03-21 DIAGNOSIS — L819 Disorder of pigmentation, unspecified: Secondary | ICD-10-CM

## 2022-03-21 DIAGNOSIS — E785 Hyperlipidemia, unspecified: Secondary | ICD-10-CM | POA: Diagnosis not present

## 2022-03-21 LAB — CBC WITH DIFFERENTIAL/PLATELET
Basophils Absolute: 0 10*3/uL (ref 0.0–0.1)
Basophils Relative: 0.3 % (ref 0.0–3.0)
Eosinophils Absolute: 0.1 10*3/uL (ref 0.0–0.7)
Eosinophils Relative: 2.7 % (ref 0.0–5.0)
HCT: 37.5 % (ref 36.0–46.0)
Hemoglobin: 12.7 g/dL (ref 12.0–15.0)
Lymphocytes Relative: 33.8 % (ref 12.0–46.0)
Lymphs Abs: 1.2 10*3/uL (ref 0.7–4.0)
MCHC: 33.8 g/dL (ref 30.0–36.0)
MCV: 89 fl (ref 78.0–100.0)
Monocytes Absolute: 0.3 10*3/uL (ref 0.1–1.0)
Monocytes Relative: 9.5 % (ref 3.0–12.0)
Neutro Abs: 1.9 10*3/uL (ref 1.4–7.7)
Neutrophils Relative %: 53.7 % (ref 43.0–77.0)
Platelets: 232 10*3/uL (ref 150.0–400.0)
RBC: 4.21 Mil/uL (ref 3.87–5.11)
RDW: 14.2 % (ref 11.5–15.5)
WBC: 3.5 10*3/uL — ABNORMAL LOW (ref 4.0–10.5)

## 2022-03-21 LAB — LIPID PANEL
Cholesterol: 208 mg/dL — ABNORMAL HIGH (ref 0–200)
HDL: 61.5 mg/dL (ref >39.00)
LDL Cholesterol: 135 mg/dL — ABNORMAL HIGH (ref 0–99)
NonHDL: 146.13
Total CHOL/HDL Ratio: 3
Triglycerides: 57 mg/dL (ref 0.0–149.0)
VLDL: 11.4 mg/dL (ref 0.0–40.0)

## 2022-03-21 LAB — IBC + FERRITIN
Ferritin: 52.1 ng/mL (ref 10.0–291.0)
Iron: 95 ug/dL (ref 42–145)
Saturation Ratios: 29 % (ref 20.0–50.0)
TIBC: 327.6 ug/dL (ref 250.0–450.0)
Transferrin: 234 mg/dL (ref 212.0–360.0)

## 2022-03-21 LAB — VITAMIN D 25 HYDROXY (VIT D DEFICIENCY, FRACTURES): VITD: 71.87 ng/mL (ref 30.00–100.00)

## 2022-03-21 LAB — VITAMIN B12: Vitamin B-12: 561 pg/mL (ref 211–911)

## 2022-03-21 NOTE — Progress Notes (Signed)
Subjective:    Patient ID: Janet Sanchez, female    DOB: 14-Aug-1970, 51 y.o.   MRN: [id number]  HPI 51 year old female who  has a past medical history of Enlarged tonsils, Herpes, Hypertension, Migraines, Morbid obesity ([place]), and Vasovagal syncope (10/24/2017).  She presents to the office today for concern of darkening of her lips.   She does have a history of iron deficiency with her last iron infusion in June 2023 and she is now taking oral iron   She does not smoke   She spends a lot of time outside in the sun.   She would also like to recheck her cholesterol panel as she has not been taking Zetia. She is statin intolerant. She has been eating healthy.  Review of Systems See HPI   Past Medical History:  Diagnosis Date   Enlarged tonsils    Herpes    Hypertension    Migraines    Morbid obesity ([place])    Vasovagal syncope 10/24/2017    Social History   Socioeconomic History   Marital status: Married    Spouse name: Not on file   Number of children: Not on file   Years of education: Not on file   Highest education level: Not on file  Occupational History   Not on file  Tobacco Use   Smoking status: Never   Smokeless tobacco: Never  Vaping Use   Vaping Use: Never used  Substance and Sexual Activity   Alcohol use: No   Drug use: No   Sexual activity: Yes    Birth control/protection: Surgical  Other Topics Concern   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, hospital   Married for 24 years    Five children all live locally   Social Determinants of Radio broadcast assistant Strain: Not on file  Food Insecurity: Not on file  Transportation Needs: Not on file  Physical Activity: Not on file  Stress: Not on file  Social Connections: Not on file  Intimate Partner Violence: Not on file    Past Surgical History:  Procedure Laterality Date   TUBAL LIGATION      Family History  Problem Relation Age of Onset   Heart attack  Mother 86   Hypertension Father    Heart attack Father 34   Hypertension Sister    Hypertension Brother    Arrhythmia Brother    Hypertension Sister     Allergies  Allergen Reactions   Potassium-Containing Compounds Other (See Comments)    Severe cramps caused her to pass out.   Atorvastatin     Myalgia    Hctz [Hydrochlorothiazide] Other (See Comments)    Renal impairment    Simvastatin     Myalgia    Zetia [Ezetimibe] Other (See Comments)    Myalgia    Lisinopril Other (See Comments)    Headache     Current Outpatient Medications on File Prior to Visit  Medication Sig Dispense Refill   albuterol (VENTOLIN HFA) 108 (90 Base) MCG/ACT inhaler INHALE 2 PUFFS INTO THE LUNGS EVERY 6 HOURS AS NEEDED FOR FOR WHEEZING OR SHORTNESS OF BREATH 18 g 2   amLODipine (NORVASC) 10 MG tablet Take 10 mg by mouth daily.     aspirin EC 81 MG tablet Take 81 mg by mouth daily.     aspirin-acetaminophen-caffeine (EXCEDRIN MIGRAINE) 250-250-65 MG tablet Take by mouth every 6 (six) hours as needed  for headache.     BLACK ELDERBERRY PO Take by mouth.     cetirizine (ZYRTEC) 10 MG tablet Take 10 mg by mouth daily.     ezetimibe (ZETIA) 10 MG tablet TAKE 1 TABLET BY MOUTH EVERY DAY 90 tablet 3   Ferrous Sulfate (IRON PO) Take by mouth.     fluticasone (FLONASE) 50 MCG/ACT nasal spray Place 1 spray into both nostrils daily. 16 g 0   gabapentin (NEURONTIN) 100 MG capsule Take 2 capsules (200 mg total) by mouth at bedtime. 180 capsule 3   irbesartan (AVAPRO) 300 MG tablet Take 300 mg by mouth daily.     Multiple Vitamin (MULTIVITAMIN) tablet Take 1 tablet by mouth daily.     Multiple Vitamins-Minerals (WOMENS 50+ ADVANCED PO) Take by mouth.     omeprazole (PRILOSEC) 20 MG capsule Take 1 capsule (20 mg total) by mouth daily. Schedule physical for future refills 90 capsule 1   valACYclovir (VALTREX) 1000 MG tablet take 1 tablet by mouth 2 times daily for 10 days as needed for flare 90 tablet 0   No  current facility-administered medications on file prior to visit.    BP 138/82   Temp 98.6 F (37 C) (Oral)   Ht '5\' 3"'$  (1.6 m)   Wt 244 lb (110.7 kg)   BMI 43.22 kg/m       Objective:   Physical Exam Vitals and nursing note reviewed.  Constitutional:      Appearance: Normal appearance.  Cardiovascular:     Rate and Rhythm: Normal rate and regular rhythm.     Pulses: Normal pulses.     Heart sounds: Normal heart sounds.  Pulmonary:     Effort: Pulmonary effort is normal.     Breath sounds: Normal breath sounds.  Musculoskeletal:        General: Normal range of motion.  Skin:    General: Skin is warm and dry.     Capillary Refill: Capillary refill takes less than 2 seconds.  Neurological:     General: No focal deficit present.     Mental Status: She is alert and oriented to person, place, and time.  Psychiatric:        Mood and Affect: Mood normal.        Behavior: Behavior normal.        Thought Content: Thought content normal.        Judgment: Judgment normal.        Assessment & Plan:  1. Hyperpigmentation - discussed different causes of hyperpigmentation including hormonal levels, sun exposure, and vitamin deficiency. Encouraged to start using a lip balm with SPF. Will check labs  - VITAMIN D 25 Hydroxy (Vit-D Deficiency, Fractures); Future - Vitamin B12; Future - IBC + Ferritin; Future - CBC with Differential/Platelet; Future  2. Hyperlipidemia, unspecified hyperlipidemia type - likely need to restart zetia  - Lipid panel; Future  [name], NP

## 2022-03-22 ENCOUNTER — Telehealth: Payer: Self-pay | Admitting: Adult Health

## 2022-03-22 NOTE — Telephone Encounter (Signed)
Wants to speak with someone regarding her WBC, wonders if this is what is making her feel weak and drained. Pls call with guidance

## 2022-03-23 ENCOUNTER — Encounter: Payer: Self-pay | Admitting: Adult Health

## 2022-03-23 NOTE — Telephone Encounter (Signed)
Left message for pt to return call.

## 2022-03-24 ENCOUNTER — Other Ambulatory Visit: Payer: Self-pay

## 2022-03-24 MED ORDER — ROSUVASTATIN CALCIUM 5 MG PO TABS
5.0000 mg | ORAL_TABLET | Freq: Every day | ORAL | 3 refills | Status: DC
Start: 2022-03-24 — End: 2022-12-12

## 2022-04-17 LAB — COLOGUARD

## 2022-05-16 ENCOUNTER — Other Ambulatory Visit: Payer: Self-pay | Admitting: Adult Health

## 2022-08-12 ENCOUNTER — Other Ambulatory Visit: Payer: Self-pay

## 2022-08-12 ENCOUNTER — Encounter (HOSPITAL_COMMUNITY): Payer: Self-pay

## 2022-08-12 ENCOUNTER — Emergency Department (HOSPITAL_COMMUNITY)
Admission: EM | Admit: 2022-08-12 | Discharge: 2022-08-12 | Disposition: A | Discharge status: Home / Self Care | Payer: Commercial Managed Care - HMO | Attending: Emergency Medicine | Admitting: Emergency Medicine

## 2022-08-12 DIAGNOSIS — I10 Essential (primary) hypertension: Secondary | ICD-10-CM | POA: Insufficient documentation

## 2022-08-12 DIAGNOSIS — N390 Urinary tract infection, site not specified: Secondary | ICD-10-CM | POA: Diagnosis not present

## 2022-08-12 DIAGNOSIS — E876 Hypokalemia: Secondary | ICD-10-CM | POA: Diagnosis not present

## 2022-08-12 DIAGNOSIS — R1030 Lower abdominal pain, unspecified: Secondary | ICD-10-CM | POA: Diagnosis present

## 2022-08-12 LAB — URINALYSIS, ROUTINE W REFLEX MICROSCOPIC
Bacteria, UA: NONE SEEN
Bilirubin Urine: NEGATIVE
Glucose, UA: NEGATIVE mg/dL
Hgb urine dipstick: NEGATIVE
Ketones, ur: NEGATIVE mg/dL
Nitrite: NEGATIVE
Protein, ur: NEGATIVE mg/dL
Specific Gravity, Urine: 1.003 — ABNORMAL LOW (ref 1.005–1.030)
pH: 7 (ref 5.0–8.0)

## 2022-08-12 LAB — COMPREHENSIVE METABOLIC PANEL
ALT: 25 U/L (ref 0–44)
AST: 24 U/L (ref 15–41)
Albumin: 4.2 g/dL (ref 3.5–5.0)
Alkaline Phosphatase: 71 U/L (ref 38–126)
Anion gap: 10 (ref 5–15)
BUN: 14 mg/dL (ref 6–20)
CO2: 25 mmol/L (ref 22–32)
Calcium: 9.2 mg/dL (ref 8.9–10.3)
Chloride: 102 mmol/L (ref 98–111)
Creatinine, Ser: 1.01 mg/dL — ABNORMAL HIGH (ref 0.44–1.00)
GFR, Estimated: >60 mL/min (ref >60)
Glucose, Bld: 93 mg/dL (ref 70–99)
Potassium: 3.4 mmol/L — ABNORMAL LOW (ref 3.5–5.1)
Sodium: 137 mmol/L (ref 135–145)
Total Bilirubin: 0.9 mg/dL (ref 0.3–1.2)
Total Protein: 8.5 g/dL — ABNORMAL HIGH (ref 6.5–8.1)

## 2022-08-12 LAB — CBC WITH DIFFERENTIAL/PLATELET
Abs Immature Granulocytes: 0.02 10*3/uL (ref 0.00–0.07)
Basophils Absolute: 0 10*3/uL (ref 0.0–0.1)
Basophils Relative: 0 %
Eosinophils Absolute: 0.1 10*3/uL (ref 0.0–0.5)
Eosinophils Relative: 2 %
HCT: 39.2 % (ref 36.0–46.0)
Hemoglobin: 12.9 g/dL (ref 12.0–15.0)
Immature Granulocytes: 0 %
Lymphocytes Relative: 34 %
Lymphs Abs: 1.8 10*3/uL (ref 0.7–4.0)
MCH: 30.1 pg (ref 26.0–34.0)
MCHC: 32.9 g/dL (ref 30.0–36.0)
MCV: 91.4 fL (ref 80.0–100.0)
Monocytes Absolute: 0.5 10*3/uL (ref 0.1–1.0)
Monocytes Relative: 10 %
Neutro Abs: 2.8 10*3/uL (ref 1.7–7.7)
Neutrophils Relative %: 54 %
Platelets: 272 10*3/uL (ref 150–400)
RBC: 4.29 MIL/uL (ref 3.87–5.11)
RDW: 12.8 % (ref 11.5–15.5)
WBC: 5.2 10*3/uL (ref 4.0–10.5)
nRBC: 0 % (ref 0.0–0.2)

## 2022-08-12 LAB — PREGNANCY, URINE: Preg Test, Ur: NEGATIVE

## 2022-08-12 LAB — LIPASE, BLOOD: Lipase: 41 U/L (ref 11–51)

## 2022-08-12 MED ORDER — CEPHALEXIN 500 MG PO CAPS: 500.0000 mg | ORAL_CAPSULE | Freq: Four times a day (QID) | ORAL | 0 refills | Status: DC

## 2022-08-12 NOTE — ED Provider Notes (Signed)
[facility] Provider Note   CSN: [id number] Arrival date & time: 08/12/22  1617     History Chief Complaint  Patient presents with   Abdominal Pain    HPI Janet Sanchez is a 52 y.o. female presenting for chief complaint of suprapubic abdominal pain, dysuria, urinary frequency going every 5 to 10 minutes, burning on urination and a popping sensation in her abdomen earlier today.  All the symptoms are grossly improving though she states the dysuria is progressive. Denies fevers or chills, nausea vomiting causing shortness of breath.  She otherwise ambulatory tolerating p.o. intake.  Denies chest pain.  Endorses a history of similar in the past.  History of obesity, syncope, carpal tunnel, GERD, abnormal uterine bleeding. Patient's recorded medical, surgical, social, medication list and allergies were reviewed in the Snapshot window as part of the initial history.   Review of Systems   Review of Systems  Constitutional:  Negative for chills and fever.  HENT:  Negative for ear pain and sore throat.   Eyes:  Negative for pain and visual disturbance.  Respiratory:  Negative for cough and shortness of breath.   Cardiovascular:  Negative for chest pain and palpitations.  Gastrointestinal:  Negative for abdominal pain and vomiting.  Genitourinary:  Positive for dysuria and urgency. Negative for hematuria.  Musculoskeletal:  Negative for arthralgias and back pain.  Skin:  Negative for color change and rash.  Neurological:  Negative for seizures and syncope.  All other systems reviewed and are negative.   Physical Exam Updated Vital Signs BP (!) 148/83   Pulse 95   Temp 99.1 F (37.3 C)   Resp 16   Ht '5\' 3"'$  (1.6 m)   Wt 108.9 kg   LMP 07/24/2022 (Approximate)   SpO2 97%   BMI 42.51 kg/m  Physical Exam Vitals and nursing note reviewed.  Constitutional:      General: She is not in acute distress.    Appearance: She is  well-developed.  HENT:     Head: Normocephalic and atraumatic.  Eyes:     Conjunctiva/sclera: Conjunctivae normal.  Cardiovascular:     Rate and Rhythm: Normal rate and regular rhythm.     Heart sounds: No murmur heard. Pulmonary:     Effort: Pulmonary effort is normal. No respiratory distress.     Breath sounds: Normal breath sounds.  Abdominal:     General: There is no distension.     Palpations: Abdomen is soft.     Tenderness: There is no abdominal tenderness. There is no right CVA tenderness, left CVA tenderness, guarding or rebound.  Musculoskeletal:        General: No swelling or tenderness. Normal range of motion.     Cervical back: Neck supple.  Skin:    General: Skin is warm and dry.  Neurological:     General: No focal deficit present.     Mental Status: She is alert and oriented to person, place, and time. Mental status is at baseline.     Cranial Nerves: No cranial nerve deficit.      ED Course/ Medical Decision Making/ A&P    Procedures Procedures   Medications Ordered in ED Medications - No data to display  Medical Decision Making:    Janet Sanchez is a 52 y.o. female who presented to the ED today with chief complaint of abdominal pain now resolved and urinary discomfort that is progressive detailed above.     Patient's  presentation is complicated by their history of Obesity.  Patient placed on continuous vitals and telemetry monitoring while in ED which was reviewed periodically.   Complete initial physical exam performed, notably the patient  was hemodynamically stable in no acute distress.  No focal abdominal pain at this time..      Reviewed and confirmed nursing documentation for past medical history, family history, social history.    Initial Assessment:   With the patient's presentation of dysuria, urinary frequency, most likely diagnosis is urinary tract infection. Other diagnoses were considered including (but not limited to)  pyelonephritis. These are considered less likely due to history of present illness and physical exam findings.  Additionally, patient's abdominal pain earlier in the day may be concerning for appendicitis, cholecystitis or other severe intra-abdominal pathology.  However given complete resolution, well appearance at this time the seemGrossly less likely.  We discussed further workup for these   Conditions and patient agrees with observational care with strict return precautions at this time. This is most consistent with an acute life/limb threatening illness complicated by underlying chronic conditions.  Initial Plan:  Lipase to evaluate for intra-abdominal pathology Screening labs including CBC and Metabolic panel to evaluate for infectious or metabolic etiology of disease.  Urinalysis with reflex culture ordered to evaluate for UTI or relevant urologic/nephrologic pathology.  Objective evaluation as below reviewed with plan for close reassessment  Initial Study Results:   Laboratory  All laboratory results reviewed without evidence of clinically relevant pathology.   Exceptions include: Leukocyturia  Final Assessment and Plan:   Patient's objective evaluation is grossly reassuring.  Mild hypokalemia but patient has declined potassium replacement stating that she would take it in via fruits at home. Additionally, evidence of possible UTI on her labs.  Hypertension is resolved with her home antihypertensives that she took prior to arrival.  Notably, she has had to go to the bathroom multiple times over the last hour and a half due to level of frequency.  She feels she is able to void completely when she goes. Will treat patient's UTI with Keflex and have her follow-up with her PCP in the outpatient setting for culture report, further diagnostic care and management in the next 5 days.  Clinical Impression:  1. Lower urinary tract infectious disease      Discharge   Final Clinical  Impression(s) / ED Diagnoses Final diagnoses:  Lower urinary tract infectious disease    Rx / DC Orders ED Discharge Orders          Ordered    cephALEXin (KEFLEX) 500 MG capsule  4 times daily        08/12/22 1747              Tretha Sciara, MD 08/12/22 1747

## 2022-08-12 NOTE — ED Triage Notes (Signed)
States that she heard a pop in her stomach, and has a burning sensation

## 2022-08-18 ENCOUNTER — Encounter: Payer: Self-pay | Admitting: Adult Health

## 2022-08-18 ENCOUNTER — Ambulatory Visit (INDEPENDENT_AMBULATORY_CARE_PROVIDER_SITE_OTHER): Payer: Commercial Managed Care - HMO | Admitting: Adult Health

## 2022-08-18 VITALS — BP 132/82 | Temp 98.4°F | Ht 63.0 in | Wt 245.0 lb

## 2022-08-18 DIAGNOSIS — E876 Hypokalemia: Secondary | ICD-10-CM

## 2022-08-18 DIAGNOSIS — Z131 Encounter for screening for diabetes mellitus: Secondary | ICD-10-CM

## 2022-08-18 DIAGNOSIS — R1032 Left lower quadrant pain: Secondary | ICD-10-CM | POA: Diagnosis not present

## 2022-08-18 NOTE — Progress Notes (Signed)
Subjective:    Patient ID: Janet Sanchez, female    DOB: 1970-12-24, 52 y.o.   MRN: [id number]  HPI  52 year old female who  has a past medical history of Enlarged tonsils, Herpes, Hypertension, Migraines, Morbid obesity ([place]), and Vasovagal syncope (10/24/2017).  6 days ago she was at home and was taking her seatbelt off and felt a pop in her stomach and her stomach started to feel warm. She then started to not feel well and went to the ER. In the ER she had urinary frequency and urgency, UA showed + leuks but culture was not sent. She was started on Keflex  She reports today that the pain has improved but is still present intermittently.  This pain is felt as an ache and she feels as though it is throughout her entire abdomen.  She denies fevers, chills, diarrhea,, nausea, or vomiting.  Different positions does not aggravate the pain.  She has not been taking any over-the-counter medications for pain.  She does have a history of uterine fibroids.  She has not noticed any vaginal bleeding  In the emergency room it was also noted that she was hypokalemic, she has been eating more bananas to help increase her potassium level  Also like to have her A1c checked so that she can get $35 from her insurance   Review of Systems See HPI   Past Medical History:  Diagnosis Date   Enlarged tonsils    Herpes    Hypertension    Migraines    Morbid obesity ([place])    Vasovagal syncope 10/24/2017    Social History   Socioeconomic History   Marital status: Married    Spouse name: Not on file   Number of children: Not on file   Years of education: Not on file   Highest education level: Not on file  Occupational History   Not on file  Tobacco Use   Smoking status: Never   Smokeless tobacco: Never  Vaping Use   Vaping Use: Never used  Substance and Sexual Activity   Alcohol use: No   Drug use: No   Sexual activity: Yes    Birth control/protection: Surgical  Other Topics Concern    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, hospital   Married for 24 years    Five children all live locally   Social Determinants of Radio broadcast assistant Strain: Not on file  Food Insecurity: Not on file  Transportation Needs: Not on file  Physical Activity: Not on file  Stress: Not on file  Social Connections: Not on file  Intimate Partner Violence: Not on file    Past Surgical History:  Procedure Laterality Date   TUBAL LIGATION      Family History  Problem Relation Age of Onset   Heart attack Mother 38   Hypertension Father    Heart attack Father 90   Hypertension Sister    Hypertension Brother    Arrhythmia Brother    Hypertension Sister     Allergies  Allergen Reactions   Potassium-Containing Compounds Other (See Comments)    Severe cramps caused her to pass out.   Atorvastatin     Myalgia    Hctz [Hydrochlorothiazide] Other (See Comments)    Renal impairment    Simvastatin     Myalgia    Zetia [Ezetimibe] Other (See Comments)    Throat itching    Lisinopril  Other (See Comments)    Headache     Current Outpatient Medications on File Prior to Visit  Medication Sig Dispense Refill   albuterol (VENTOLIN HFA) 108 (90 Base) MCG/ACT inhaler INHALE 2 PUFFS INTO THE LUNGS EVERY 6 HOURS AS NEEDED FOR FOR WHEEZING OR SHORTNESS OF BREATH 18 g 2   amLODipine (NORVASC) 10 MG tablet Take 10 mg by mouth daily.     aspirin EC 81 MG tablet Take 81 mg by mouth daily.     aspirin-acetaminophen-caffeine (EXCEDRIN MIGRAINE) 250-250-65 MG tablet Take by mouth every 6 (six) hours as needed for headache.     BLACK ELDERBERRY PO Take by mouth.     cephALEXin (KEFLEX) 500 MG capsule Take 1 capsule (500 mg total) by mouth 4 (four) times daily. 20 capsule 0   cetirizine (ZYRTEC) 10 MG tablet Take 10 mg by mouth daily.     ezetimibe (ZETIA) 10 MG tablet TAKE 1 TABLET BY MOUTH EVERY DAY 90 tablet 3   Ferrous Sulfate (IRON PO) Take by mouth.      fluticasone (FLONASE) 50 MCG/ACT nasal spray Place 1 spray into both nostrils daily. 16 g 0   gabapentin (NEURONTIN) 100 MG capsule Take 2 capsules (200 mg total) by mouth at bedtime. 180 capsule 3   irbesartan (AVAPRO) 300 MG tablet Take 300 mg by mouth daily.     Multiple Vitamin (MULTIVITAMIN) tablet Take 1 tablet by mouth daily.     Multiple Vitamins-Minerals (WOMENS 50+ ADVANCED PO) Take by mouth.     omeprazole (PRILOSEC) 20 MG capsule Take 1 capsule by mouth daily. Schedule physical for future refills 90 capsule 1   rosuvastatin (CRESTOR) 5 MG tablet Take 1 tablet (5 mg total) by mouth daily. 90 tablet 3   valACYclovir (VALTREX) 1000 MG tablet take 1 tablet by mouth 2 times daily for 10 days as needed for flare 90 tablet 0   No current facility-administered medications on file prior to visit.    BP 132/82   Temp 98.4 F (36.9 C) (Oral)   Ht '5\' 3"'$  (1.6 m)   Wt 245 lb (111.1 kg)   LMP 07/24/2022 (Approximate)   BMI 43.40 kg/m       Objective:   Physical Exam Vitals and nursing note reviewed.  Constitutional:      Appearance: Normal appearance. She is obese.  Cardiovascular:     Rate and Rhythm: Normal rate and regular rhythm.     Pulses: Normal pulses.     Heart sounds: Normal heart sounds.  Pulmonary:     Effort: Pulmonary effort is normal.     Breath sounds: Normal breath sounds.  Abdominal:     General: Abdomen is flat. Bowel sounds are normal.     Palpations: Abdomen is soft.     Tenderness: There is abdominal tenderness in the left lower quadrant.  Skin:    General: Skin is warm and dry.  Neurological:     General: No focal deficit present.     Mental Status: She is alert and oriented to person, place, and time.  Psychiatric:        Mood and Affect: Mood normal.        Behavior: Behavior normal.        Thought Content: Thought content normal.        Judgment: Judgment normal.       Assessment & Plan:  1. Left lower quadrant abdominal pain - unsure of  cause. She is still tender in  LLQ with palpation.  - CT ABDOMEN PELVIS WO CONTRAST; Future - Basic Metabolic Panel; Future  2. Hypokalemia  - Basic Metabolic Panel; Future  3. Screening for diabetes mellitus  - Hemoglobin A1c; Future  [name], NP  Time spent with patient today was 32 minutes which consisted of chart review, discussing diagnosis, work up, treatment answering questions and documentation.  [name], NP

## 2022-08-18 NOTE — Patient Instructions (Signed)
I am going to order a CT scan on you   We will do some blood work on you   Please schedule your physical exam

## 2022-08-19 LAB — HEMOGLOBIN A1C
Hgb A1c MFr Bld: 5.5 % of total Hgb (ref <5.7)
Mean Plasma Glucose: 111 mg/dL
eAG (mmol/L): 6.2 mmol/L

## 2022-08-19 LAB — BASIC METABOLIC PANEL
BUN/Creatinine Ratio: 13 (calc) (ref 6–22)
BUN: 14 mg/dL (ref 7–25)
CO2: 23 mmol/L (ref 20–32)
Calcium: 9.3 mg/dL (ref 8.6–10.4)
Chloride: 103 mmol/L (ref 98–110)
Creat: 1.06 mg/dL — ABNORMAL HIGH (ref 0.50–1.03)
Glucose, Bld: 75 mg/dL (ref 65–99)
Potassium: 3.9 mmol/L (ref 3.5–5.3)
Sodium: 137 mmol/L (ref 135–146)

## 2022-09-11 ENCOUNTER — Other Ambulatory Visit: Payer: Self-pay | Admitting: Adult Health

## 2022-09-11 DIAGNOSIS — Z760 Encounter for issue of repeat prescription: Secondary | ICD-10-CM

## 2022-09-12 NOTE — Telephone Encounter (Signed)
The original prescription was discontinued on 10/14/2019 by [name], NP. Renewing this prescription may not be appropriate.    Sig: TAKE 1 TABLET BY MOUTH 2 TIMES DAILY

## 2022-09-15 ENCOUNTER — Other Ambulatory Visit: Payer: Self-pay | Admitting: Adult Health

## 2022-09-15 DIAGNOSIS — R238 Other skin changes: Secondary | ICD-10-CM

## 2022-09-18 IMAGING — MG DIGITAL DIAGNOSTIC BILAT W/ TOMO W/ CAD
8 series · 8 of 24 positions shown · non-contrast
Comparison: Previous exam(s).

CLINICAL DATA: Patient presents for a bilateral diagnostic exam to
follow-up a probable benign mass over the 8 o'clock position of the

EXAM:
DIGITAL DIAGNOSTIC BILATERAL MAMMOGRAM WITH TOMOSYNTHESIS AND CAD;
ULTRASOUND RIGHT BREAST LIMITED
TECHNIQUE: Bilateral digital diagnostic mammography and breast tomosynthesis
was performed. The images were evaluated with computer-aided
detection.; Targeted ultrasound examination of the right breast was
performed

[R CC synth-2D]
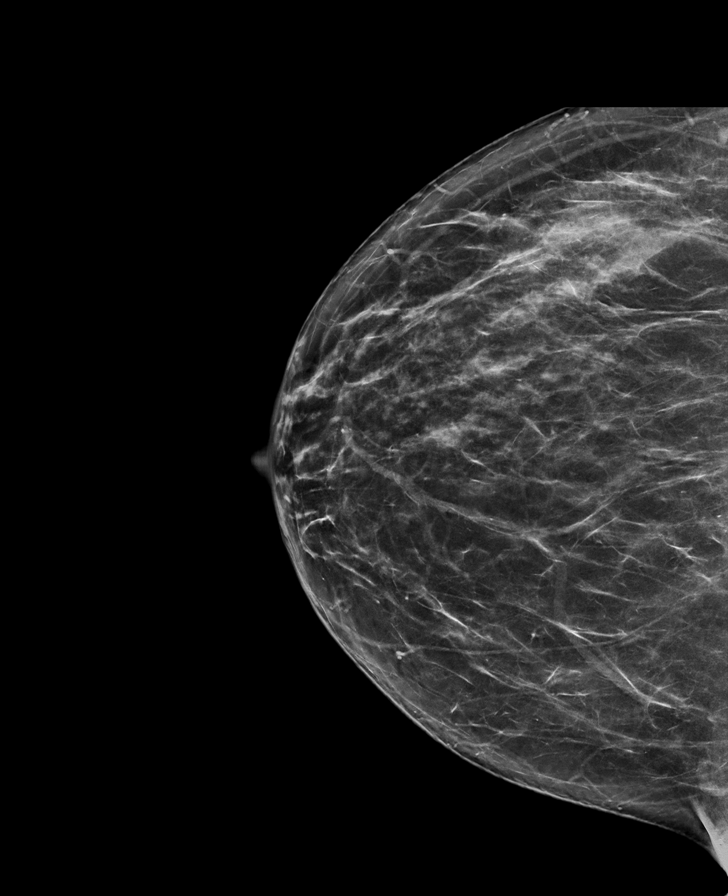

[L MLO synth-2D]
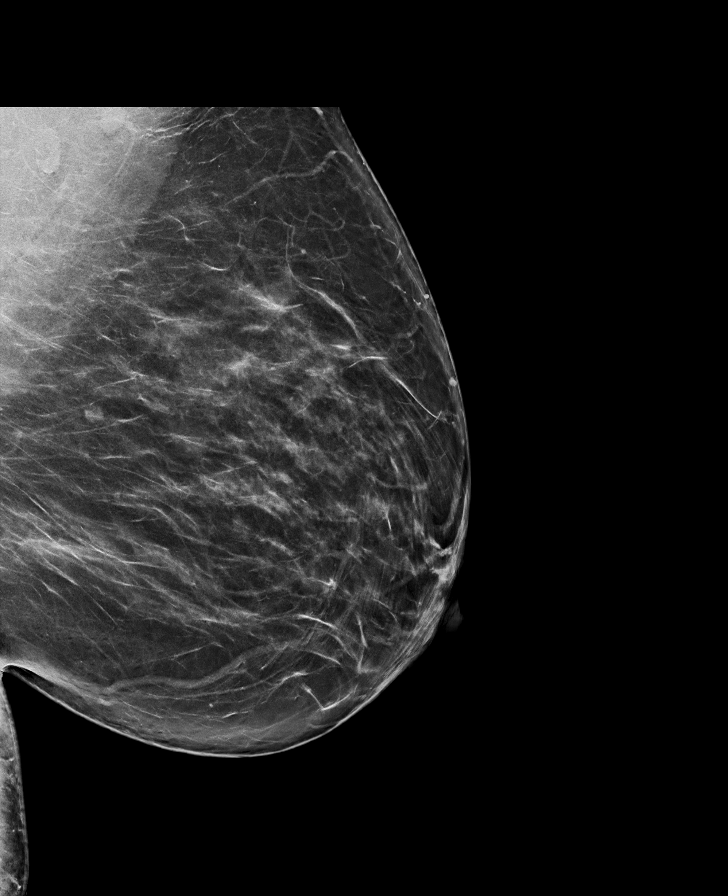

[L CC synth-2D]
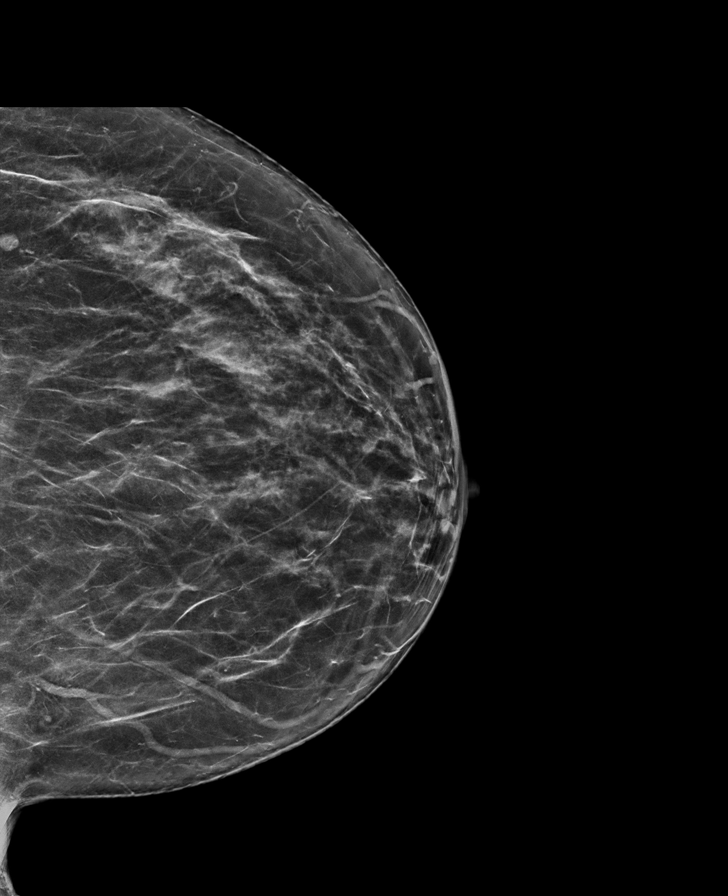

[R MLO synth-2D]
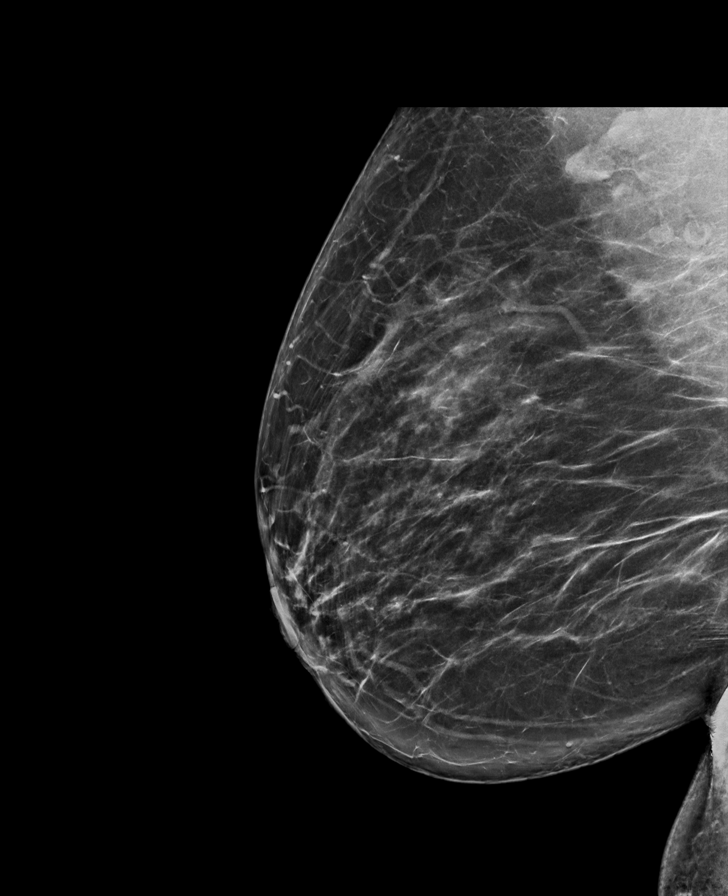

[L CC tomo · tomo slice 37/72.0]
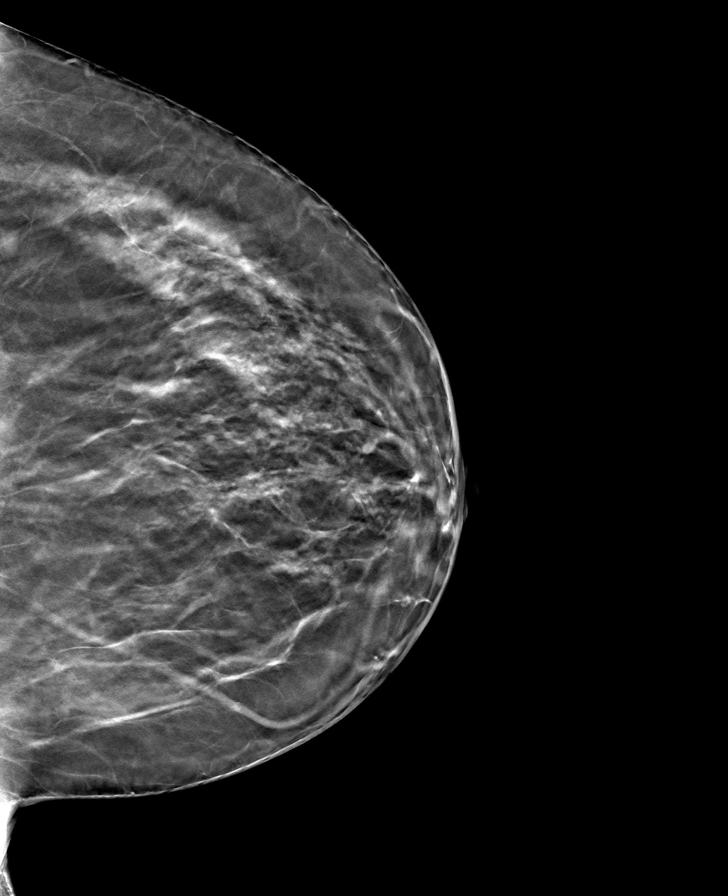

[L MLO tomo · tomo slice 45/89.0]
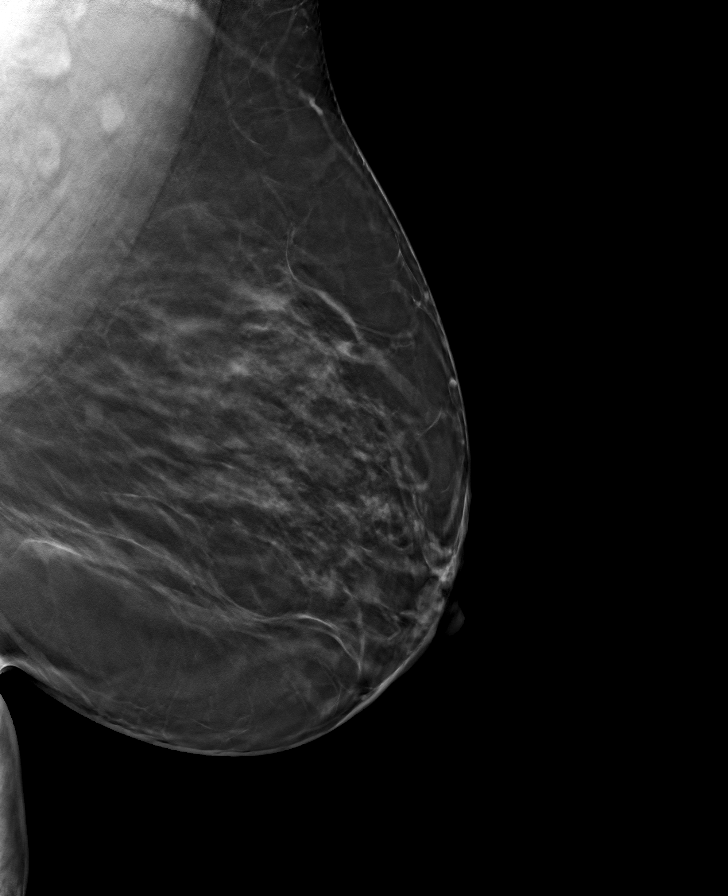

[R CC tomo · tomo slice 38/75.0]
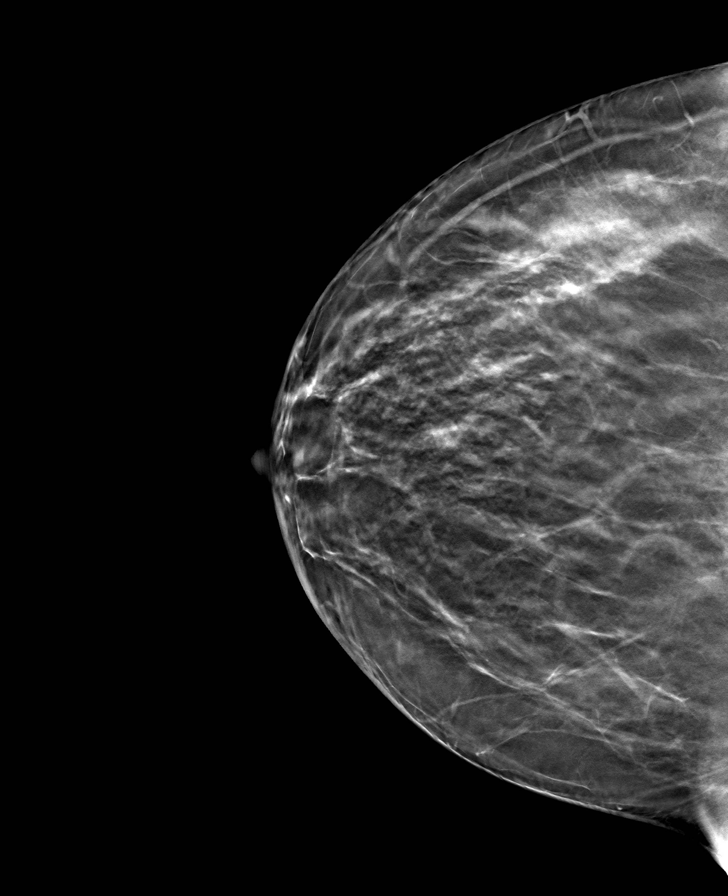

[R MLO tomo · tomo slice 45/89.0]
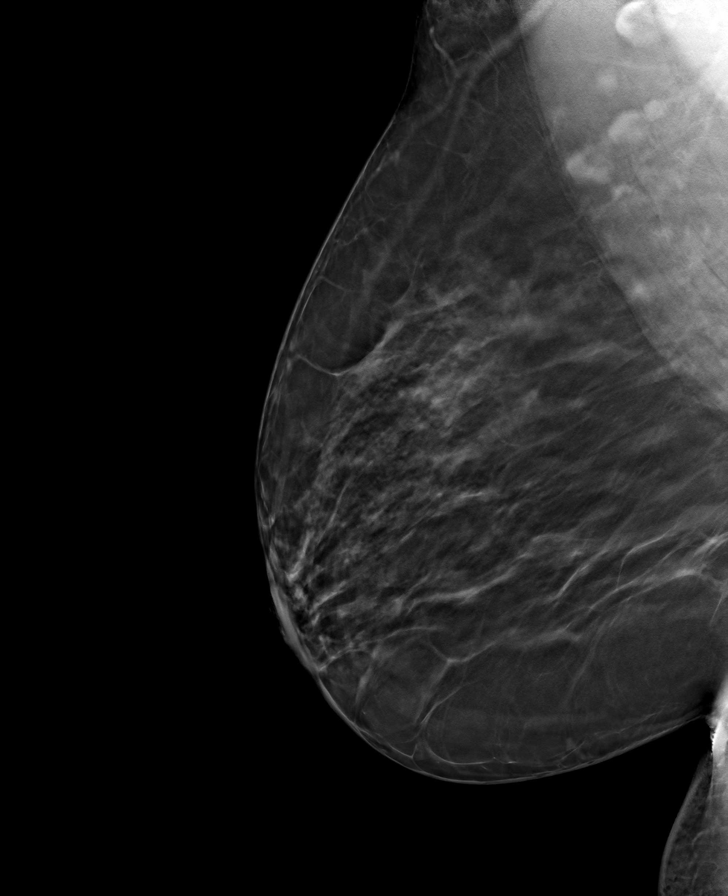

[8 of 24 positions shown; findings below may reference images not displayed]

ACR Breast Density Category b: There are scattered areas of
fibroglandular density.
FINDINGS: Exam demonstrates no change in oval circumscribed mass over the
middle third of the slightly outer lower right breast. Remainder of
the right breast as well as the left breast is unchanged.

Targeted ultrasound is performed, showing no change in oval
circumscribed hypoechoic mass at the 8 o'clock position of the right
breast 3 cm from the nipple measuring 3 x 5 x 8 mm.
IMPRESSION: Stable probable benign 8 mm mass over the 8 o'clock position of the
right breast.

RECOMMENDATION:
Recommend an additional follow-up diagnostic right breast mammogram
and ultrasound in 1 year at the time of patient's annual bilateral
mammogram in October 2021 to document 2 years of stability.

I have discussed the findings and recommendations with the patient.
If applicable, a reminder letter will be sent to the patient
regarding the next appointment.

BI-RADS CATEGORY  3: Probably benign.

## 2022-09-21 ENCOUNTER — Ambulatory Visit (INDEPENDENT_AMBULATORY_CARE_PROVIDER_SITE_OTHER): Payer: Commercial Managed Care - HMO | Admitting: Adult Health

## 2022-09-21 ENCOUNTER — Encounter: Payer: Self-pay | Admitting: Adult Health

## 2022-09-21 VITALS — BP 100/80 | Temp 98.1°F | Ht 63.0 in | Wt 245.0 lb

## 2022-09-21 DIAGNOSIS — E785 Hyperlipidemia, unspecified: Secondary | ICD-10-CM | POA: Diagnosis not present

## 2022-09-21 DIAGNOSIS — Z1211 Encounter for screening for malignant neoplasm of colon: Secondary | ICD-10-CM

## 2022-09-21 DIAGNOSIS — Z0000 Encounter for general adult medical examination without abnormal findings: Secondary | ICD-10-CM

## 2022-09-21 DIAGNOSIS — I10 Essential (primary) hypertension: Secondary | ICD-10-CM

## 2022-09-21 DIAGNOSIS — Z1159 Encounter for screening for other viral diseases: Secondary | ICD-10-CM

## 2022-09-21 DIAGNOSIS — K2100 Gastro-esophageal reflux disease with esophagitis, without bleeding: Secondary | ICD-10-CM | POA: Diagnosis not present

## 2022-09-21 DIAGNOSIS — N182 Chronic kidney disease, stage 2 (mild): Secondary | ICD-10-CM

## 2022-09-21 LAB — CBC WITH DIFFERENTIAL/PLATELET
Basophils Absolute: 0 10*3/uL (ref 0.0–0.1)
Basophils Relative: 0.5 % (ref 0.0–3.0)
Eosinophils Absolute: 0.1 10*3/uL (ref 0.0–0.7)
Eosinophils Relative: 2.1 % (ref 0.0–5.0)
HCT: 38.6 % (ref 36.0–46.0)
Hemoglobin: 13 g/dL (ref 12.0–15.0)
Lymphocytes Relative: 36.6 % (ref 12.0–46.0)
Lymphs Abs: 1.3 10*3/uL (ref 0.7–4.0)
MCHC: 33.7 g/dL (ref 30.0–36.0)
MCV: 90.1 fl (ref 78.0–100.0)
Monocytes Absolute: 0.3 10*3/uL (ref 0.1–1.0)
Monocytes Relative: 8.1 % (ref 3.0–12.0)
Neutro Abs: 1.9 10*3/uL (ref 1.4–7.7)
Neutrophils Relative %: 52.7 % (ref 43.0–77.0)
Platelets: 266 10*3/uL (ref 150.0–400.0)
RBC: 4.29 Mil/uL (ref 3.87–5.11)
RDW: 13 % (ref 11.5–15.5)
WBC: 3.5 10*3/uL — ABNORMAL LOW (ref 4.0–10.5)

## 2022-09-21 LAB — COMPREHENSIVE METABOLIC PANEL
ALT: 16 U/L (ref 0–35)
AST: 23 U/L (ref 0–37)
Albumin: 4.2 g/dL (ref 3.5–5.2)
Alkaline Phosphatase: 69 U/L (ref 39–117)
BUN: 14 mg/dL (ref 6–23)
CO2: 29 mEq/L (ref 19–32)
Calcium: 10.3 mg/dL (ref 8.4–10.5)
Chloride: 100 mEq/L (ref 96–112)
Creatinine, Ser: 1.06 mg/dL (ref 0.40–1.20)
GFR: 60.79 mL/min (ref >60.00)
Glucose, Bld: 78 mg/dL (ref 70–99)
Potassium: 3.8 mEq/L (ref 3.5–5.1)
Sodium: 137 mEq/L (ref 135–145)
Total Bilirubin: 1.1 mg/dL (ref 0.2–1.2)
Total Protein: 8.1 g/dL (ref 6.0–8.3)

## 2022-09-21 LAB — TSH: TSH: 2.04 u[IU]/mL (ref 0.35–5.50)

## 2022-09-21 LAB — LIPID PANEL
Cholesterol: 154 mg/dL (ref 0–200)
HDL: 62.6 mg/dL (ref >39.00)
LDL Cholesterol: 80 mg/dL (ref 0–99)
NonHDL: 91.11
Total CHOL/HDL Ratio: 2
Triglycerides: 57 mg/dL (ref 0.0–149.0)
VLDL: 11.4 mg/dL (ref 0.0–40.0)

## 2022-09-21 NOTE — Progress Notes (Signed)
Subjective:    Patient ID: Janet Sanchez, female    DOB: 11-May-1971, 52 y.o.   MRN: [id number]  HPI Patient presents for yearly preventative medicine examination. She is a pleasant 52 year old female who  has a past medical history of Enlarged tonsils, Herpes, Hypertension, Migraines, Morbid obesity ([place]), and Vasovagal syncope (10/24/2017).  HTN/CKD II -managed by nephrology.  Difficult to control with multiple blood pressure medications with reported allergies/side effects.  Currently prescribed Norvasc 10 mg daily and irbesartan 300 mg daily.  BP Readings from Last 3 Encounters:  09/21/22 100/80  08/18/22 132/82  08/12/22 (!) 148/83   GERD- prescribed Prilosec 20 mg daily   Obesity - She has not been able to lose much weight, she does not eat out much and cooks meals at home, but does not exercise much outside of walking. Wt Readings from Last 3 Encounters:  09/21/22 245 lb (111.1 kg)  08/18/22 245 lb (111.1 kg)  08/12/22 240 lb (108.9 kg)   Hyperlipidemia -managed with Zetia 10 mg daily. She has been intolerant to multiple statins  Lab Results  Component Value Date   CHOL 208 (H) 03/21/2022   HDL 61.50 03/21/2022   LDLCALC 135 (H) 03/21/2022   TRIG 57.0 03/21/2022   CHOLHDL 3 03/21/2022   Vitamin D Deficiency - takes Vitamin D - 5000 units daily.   All immunizations and health maintenance protocols were reviewed with the patient and needed orders were placed.  Appropriate screening laboratory values were ordered for the patient including screening of hyperlipidemia, renal function and hepatic function.  Medication reconciliation,  past medical history, social history, problem list and allergies were reviewed in detail with the patient  Goals were established with regard to weight loss, exercise, and  diet in compliance with medications  She is overdue for routine colon cancer screening. She is up to date on GYN care and mammograms   Review of Systems   Constitutional: Negative.   HENT: Negative.    Eyes: Negative.   Respiratory: Negative.    Cardiovascular: Negative.   Gastrointestinal: Negative.   Endocrine: Negative.   Genitourinary: Negative.   Musculoskeletal: Negative.   Skin: Negative.   Allergic/Immunologic: Negative.   Neurological: Negative.   Hematological: Negative.   Psychiatric/Behavioral: Negative.     Past Medical History:  Diagnosis Date   Enlarged tonsils    Herpes    Hypertension    Migraines    Morbid obesity ([place])    Vasovagal syncope 10/24/2017    Social History   Socioeconomic History   Marital status: Married    Spouse name: Not on file   Number of children: Not on file   Years of education: Not on file   Highest education level: Not on file  Occupational History   Not on file  Tobacco Use   Smoking status: Never   Smokeless tobacco: Never  Vaping Use   Vaping Use: Never used  Substance and Sexual Activity   Alcohol use: No   Drug use: No   Sexual activity: Yes    Birth control/protection: Surgical  Other Topics Concern   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, hospital   Married for 24 years    Five children all live locally   Social Determinants of Radio broadcast assistant Strain: Not on file  Food Insecurity: Not on file  Transportation Needs: Not on file  Physical Activity: Not on file  Stress: Not on file  Social Connections: Not on file  Intimate Partner Violence: Not on file    Past Surgical History:  Procedure Laterality Date   TUBAL LIGATION      Family History  Problem Relation Age of Onset   Heart attack Mother 14   Hypertension Father    Heart attack Father 31   Hypertension Sister    Hypertension Brother    Arrhythmia Brother    Hypertension Sister     Allergies  Allergen Reactions   Potassium-Containing Compounds Other (See Comments)    Severe cramps caused her to pass out.   Atorvastatin     Myalgia    Hctz  [Hydrochlorothiazide] Other (See Comments)    Renal impairment    Simvastatin     Myalgia    Zetia [Ezetimibe] Other (See Comments)    Throat itching    Lisinopril Other (See Comments)    Headache     Current Outpatient Medications on File Prior to Visit  Medication Sig Dispense Refill   albuterol (VENTOLIN HFA) 108 (90 Base) MCG/ACT inhaler INHALE 2 PUFFS INTO THE LUNGS EVERY 6 HOURS AS NEEDED FOR FOR WHEEZING OR SHORTNESS OF BREATH 18 g 2   amLODipine (NORVASC) 10 MG tablet Take 10 mg by mouth daily.     aspirin EC 81 MG tablet Take 81 mg by mouth daily.     aspirin-acetaminophen-caffeine (EXCEDRIN MIGRAINE) 250-250-65 MG tablet Take by mouth every 6 (six) hours as needed for headache.     BLACK ELDERBERRY PO Take by mouth.     cephALEXin (KEFLEX) 500 MG capsule Take 1 capsule (500 mg total) by mouth 4 (four) times daily. 20 capsule 0   cetirizine (ZYRTEC) 10 MG tablet Take 10 mg by mouth daily.     ezetimibe (ZETIA) 10 MG tablet TAKE 1 TABLET BY MOUTH EVERY DAY 90 tablet 3   Ferrous Sulfate (IRON PO) Take by mouth.     fluticasone (FLONASE) 50 MCG/ACT nasal spray Place 1 spray into both nostrils daily. 16 g 0   gabapentin (NEURONTIN) 100 MG capsule Take 2 capsules (200 mg total) by mouth at bedtime. 180 capsule 3   irbesartan (AVAPRO) 300 MG tablet Take 300 mg by mouth daily.     Multiple Vitamin (MULTIVITAMIN) tablet Take 1 tablet by mouth daily.     Multiple Vitamins-Minerals (WOMENS 50+ ADVANCED PO) Take by mouth.     omeprazole (PRILOSEC) 20 MG capsule Take 1 capsule by mouth daily. Schedule physical for future refills 90 capsule 1   rosuvastatin (CRESTOR) 5 MG tablet Take 1 tablet (5 mg total) by mouth daily. 90 tablet 3   Turmeric (QC TUMERIC COMPLEX PO) Take by mouth daily. Gummies     valACYclovir (VALTREX) 1000 MG tablet take 1 tablet by mouth 2 times daily for 10 days as needed for flare 90 tablet 0   No current facility-administered medications on file prior to visit.     BP 100/80   Temp 98.1 F (36.7 C) (Oral)   Ht '5\' 3"'$  (1.6 m)   Wt 245 lb (111.1 kg)   BMI 43.40 kg/m       Objective:   Physical Exam Vitals and nursing note reviewed.  Constitutional:      General: She is not in acute distress.    Appearance: Normal appearance. She is well-developed. She is obese. She is not ill-appearing.  HENT:     Head: Normocephalic and atraumatic.     Right Ear: Tympanic  membrane, ear canal and external ear normal. There is no impacted cerumen.     Left Ear: Tympanic membrane, ear canal and external ear normal. There is no impacted cerumen.     Nose: Nose normal. No congestion or rhinorrhea.     Mouth/Throat:     Mouth: Mucous membranes are moist.     Pharynx: Oropharynx is clear. No oropharyngeal exudate or posterior oropharyngeal erythema.  Eyes:     General:        Right eye: No discharge.        Left eye: No discharge.     Extraocular Movements: Extraocular movements intact.     Conjunctiva/sclera: Conjunctivae normal.     Pupils: Pupils are equal, round, and reactive to light.  Neck:     Thyroid: No thyromegaly.     Vascular: No carotid bruit.     Trachea: No tracheal deviation.  Cardiovascular:     Rate and Rhythm: Normal rate and regular rhythm.     Pulses: Normal pulses.     Heart sounds: Normal heart sounds. No murmur heard.    No friction rub. No gallop.  Pulmonary:     Effort: Pulmonary effort is normal. No respiratory distress.     Breath sounds: Normal breath sounds. No stridor. No wheezing, rhonchi or rales.  Chest:     Chest wall: No tenderness.  Abdominal:     General: Abdomen is flat. Bowel sounds are normal. There is no distension.     Palpations: Abdomen is soft. There is no mass.     Tenderness: There is no abdominal tenderness. There is no right CVA tenderness, left CVA tenderness, guarding or rebound.     Hernia: No hernia is present.  Musculoskeletal:        General: No swelling, tenderness, deformity or signs of  injury. Normal range of motion.     Cervical back: Normal range of motion and neck supple.     Right lower leg: No edema.     Left lower leg: No edema.  Lymphadenopathy:     Cervical: No cervical adenopathy.  Skin:    General: Skin is warm and dry.     Coloration: Skin is not jaundiced or pale.     Findings: No bruising, erythema, lesion or rash.  Neurological:     General: No focal deficit present.     Mental Status: She is alert and oriented to person, place, and time.     Cranial Nerves: No cranial nerve deficit.     Sensory: No sensory deficit.     Motor: No weakness.     Coordination: Coordination normal.     Gait: Gait normal.     Deep Tendon Reflexes: Reflexes normal.  Psychiatric:        Mood and Affect: Mood normal.        Behavior: Behavior normal.        Thought Content: Thought content normal.        Judgment: Judgment normal.       Assessment & Plan:  1. Routine general medical examination at a health care facility Today patient counseled on age appropriate routine health concerns for screening and prevention, each reviewed and up to date or declined. Immunizations reviewed and up to date or declined. Labs ordered and reviewed. Risk factors for depression reviewed and negative. Hearing function and visual acuity are intact. ADLs screened and addressed as needed. Functional ability and level of safety reviewed and appropriate. Education, counseling and referrals  performed based on assessed risks today. Patient provided with a copy of personalized plan for preventive services.   2. Hyperlipidemia, unspecified hyperlipidemia type - Continue Zetia  - CBC with Differential/Platelet; Future - Comprehensive metabolic panel; Future - Lipid panel; Future - TSH; Future  3. Essential hypertension - Well controlled. No change in medication  - CBC with Differential/Platelet; Future - Comprehensive metabolic panel; Future - Lipid panel; Future - TSH; Future  4.  Gastroesophageal reflux disease with esophagitis without hemorrhage - Continue PPI  - CBC with Differential/Platelet; Future - Comprehensive metabolic panel; Future - Lipid panel; Future - TSH; Future  5. Morbid obesity ([place]) - Needs to start exercising  - CBC with Differential/Platelet; Future - Comprehensive metabolic panel; Future - Lipid panel; Future - TSH; Future  6. CKD (chronic kidney disease) stage 2, GFR 60-89 ml/min - Follow up with Nephrology as directed - Avoid nephrotoxic agents  - CBC with Differential/Platelet; Future - Comprehensive metabolic panel; Future - Lipid panel; Future - TSH; Future  7. Colon cancer screening  - Cologuard  8. Need for hepatitis C screening test  - Hep C Antibody; Future  [name], NP

## 2022-09-21 NOTE — Addendum Note (Signed)
Addended by: Apolinar Junes on: 09/21/2022 10:58 AM   Modules accepted: Orders

## 2022-09-21 NOTE — Patient Instructions (Addendum)
It was great seeing you today   We will follow up with you regarding your lab work   Please let me know if you need anything   Please call [facility] about the CT scan   [phone]

## 2022-09-22 ENCOUNTER — Telehealth: Payer: Self-pay | Admitting: Adult Health

## 2022-09-22 LAB — HEPATITIS C ANTIBODY: Hepatitis C Ab: NONREACTIVE

## 2022-09-22 NOTE — Telephone Encounter (Signed)
Can you help with this please.

## 2022-09-22 NOTE — Telephone Encounter (Signed)
[facility] called to say Pt has [employer] &/or new Medicaid, and they are not in network, nor taking new patients with Medicaid.

## 2022-09-24 ENCOUNTER — Encounter: Payer: Self-pay | Admitting: Adult Health

## 2022-09-26 NOTE — Telephone Encounter (Signed)
Noted  

## 2022-09-27 NOTE — Telephone Encounter (Signed)
Please advise 

## 2022-10-25 LAB — COLOGUARD: COLOGUARD: NEGATIVE

## 2022-11-15 ENCOUNTER — Other Ambulatory Visit: Payer: Self-pay | Admitting: Adult Health

## 2022-12-12 ENCOUNTER — Other Ambulatory Visit: Payer: Self-pay | Admitting: Adult Health

## 2023-01-19 ENCOUNTER — Encounter: Payer: Self-pay | Admitting: Adult Health

## 2023-02-26 ENCOUNTER — Encounter: Payer: Self-pay | Admitting: Adult Health

## 2023-02-28 ENCOUNTER — Ambulatory Visit: Payer: Commercial Managed Care - HMO | Admitting: Adult Health

## 2023-02-28 VITALS — BP 140/90 | HR 97 | Temp 98.5°F | Ht 63.0 in | Wt 248.0 lb

## 2023-02-28 DIAGNOSIS — H1131 Conjunctival hemorrhage, right eye: Secondary | ICD-10-CM | POA: Diagnosis not present

## 2023-02-28 NOTE — Progress Notes (Signed)
Subjective:    Patient ID: Janet Sanchez, female    DOB: [birth year], 52 y.o.   MRN: [id number]  Hypertension   52 year old female who presents to the office today 4 days of " red eye". She reports that she noticed redness in her right eye when she got out of the shower. She denies trauma. Does not have any blurred vision or eye pain   Past Medical History:  Diagnosis Date   Enlarged tonsils    Herpes    Hypertension    Migraines    Morbid obesity (HCC)    Vasovagal syncope 10/24/2017    Review of Systems See HPI   Past Medical History:  Diagnosis Date   Enlarged tonsils    Herpes    Hypertension    Migraines    Morbid obesity (HCC)    Vasovagal syncope 10/24/2017    Social History   Socioeconomic History   Marital status: Married    Spouse name: Not on file   Number of children: Not on file   Years of education: Not on file   Highest education level: Bachelor's degree (e.g., BA, AB, BS)  Occupational History   Not on file  Tobacco Use   Smoking status: Never   Smokeless tobacco: Never  Vaping Use   Vaping status: Never Used  Substance and Sexual Activity   Alcohol use: No   Drug use: No   Sexual activity: Yes    Birth control/protection: Surgical  Other Topics Concern   Not on file  Social History Narrative   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, broadcasting/film/video   Married for 24 years    Five children all live locally   Social Determinants of Health   Financial Resource Strain: Low Risk  (02/28/2023)   Overall Financial Resource Strain (CARDIA)    Difficulty of Paying Living Expenses: Not hard at all  Food Insecurity: No Food Insecurity (02/28/2023)   Hunger Vital Sign    Worried About Running Out of Food in the Last Year: Never true    Ran Out of Food in the Last Year: Never true  Transportation Needs: No Transportation Needs (02/28/2023)   PRAPARE - Administrator, Civil Service (Medical): No    Lack of Transportation (Non-Medical): No  Physical  Activity: Insufficiently Active (02/28/2023)   Exercise Vital Sign    Days of Exercise per Week: 3 days    Minutes of Exercise per Session: 30 min  Stress: No Stress Concern Present (02/28/2023)   [employer] of Occupational Health - Occupational Stress Questionnaire    Feeling of Stress : Not at all  Social Connections: Socially Integrated (02/28/2023)   Social Connection and Isolation Panel [NHANES]    Frequency of Communication with Friends and Family: More than three times a week    Frequency of Social Gatherings with Friends and Family: More than three times a week    Attends Religious Services: More than 4 times per year    Active Member of [employer] or Organizations: Yes    Attends Engineer, structural: More than 4 times per year    Marital Status: Married  Catering manager Violence: Not on file    Past Surgical History:  Procedure Laterality Date   TUBAL LIGATION      Family History  Problem Relation Age of Onset   Heart attack Mother 25   Hypertension Father    Heart attack Father 22   Hypertension Sister  Hypertension Brother    Arrhythmia Brother    Hypertension Sister     Allergies  Allergen Reactions   Potassium-Containing Compounds Other (See Comments)    Severe cramps caused her to pass out.   Atorvastatin     Myalgia    Hctz [Hydrochlorothiazide] Other (See Comments)    Renal impairment    Simvastatin     Myalgia    Zetia [Ezetimibe] Other (See Comments)    Throat itching    Lisinopril Other (See Comments)    Headache     Current Outpatient Medications on File Prior to Visit  Medication Sig Dispense Refill   albuterol (VENTOLIN HFA) 108 (90 Base) MCG/ACT inhaler INHALE 2 PUFFS INTO THE LUNGS EVERY 6 HOURS AS NEEDED FOR FOR WHEEZING OR SHORTNESS OF BREATH 18 g 2   amLODipine (NORVASC) 10 MG tablet Take 10 mg by mouth daily.     aspirin EC 81 MG tablet Take 81 mg by mouth daily.     aspirin-acetaminophen-caffeine (EXCEDRIN MIGRAINE)  250-250-65 MG tablet Take by mouth every 6 (six) hours as needed for headache.     cetirizine (ZYRTEC) 10 MG tablet Take 10 mg by mouth daily.     cholecalciferol (VITAMIN D3) 25 MCG (1000 UNIT) tablet Take 5,000 Units by mouth daily.     ezetimibe (ZETIA) 10 MG tablet TAKE 1 TABLET BY MOUTH EVERY DAY 90 tablet 3   Ferrous Sulfate (IRON PO) Take by mouth.     fluticasone (FLONASE) 50 MCG/ACT nasal spray Place 1 spray into both nostrils daily. 16 g 0   gabapentin (NEURONTIN) 100 MG capsule Take 2 capsules (200 mg total) by mouth at bedtime. 180 capsule 3   irbesartan (AVAPRO) 300 MG tablet Take 300 mg by mouth daily.     Multiple Vitamins-Minerals (WOMENS 50+ ADVANCED PO) Take by mouth.     omeprazole (PRILOSEC) 20 MG capsule Take 1 capsule by mouth daily. Schedule physical for future refills 90 capsule 1   rosuvastatin (CRESTOR) 5 MG tablet TAKE 1 TABLET BY MOUTH EVERY DAY 90 tablet 3   valACYclovir (VALTREX) 1000 MG tablet take 1 tablet by mouth 2 times daily for 10 days as needed for flare 90 tablet 0   No current facility-administered medications on file prior to visit.    BP (!) 140/90   Pulse 97   Temp 98.5 F (36.9 C) (Oral)   Ht 5\' 3"  (1.6 m)   Wt 248 lb (112.5 kg)   SpO2 99%   BMI 43.93 kg/m       Objective:   Physical Exam Vitals and nursing note reviewed.  Constitutional:      Appearance: Normal appearance.  Eyes:     Conjunctiva/sclera:     Right eye: Hemorrhage present.   Neurological:     Mental Status: She is alert.        Assessment & Plan:  1. Conjunctival hemorrhage of right eye - Advised that this is likely a benign issue. Redness will resolve on it's own   Shirline Frees, NP

## 2023-05-21 ENCOUNTER — Other Ambulatory Visit: Payer: Self-pay | Admitting: Adult Health

## 2023-06-14 ENCOUNTER — Other Ambulatory Visit: Payer: Self-pay | Admitting: Adult Health

## 2023-06-14 DIAGNOSIS — J069 Acute upper respiratory infection, unspecified: Secondary | ICD-10-CM

## 2023-07-20 ENCOUNTER — Encounter: Payer: Self-pay | Admitting: Adult Health

## 2023-08-17 ENCOUNTER — Ambulatory Visit: Payer: Commercial Managed Care - HMO | Admitting: Adult Health

## 2023-08-17 ENCOUNTER — Other Ambulatory Visit (HOSPITAL_COMMUNITY)
Admission: RE | Admit: 2023-08-17 | Discharge: 2023-08-17 | Disposition: A | Discharge status: Home / Self Care | Payer: Commercial Managed Care - HMO | Source: Ambulatory Visit | Attending: Adult Health | Admitting: Adult Health

## 2023-08-17 ENCOUNTER — Encounter: Payer: Self-pay | Admitting: Adult Health

## 2023-08-17 VITALS — BP 130/84 | HR 101 | Temp 98.6°F | Ht 63.0 in | Wt 250.0 lb

## 2023-08-17 DIAGNOSIS — N898 Other specified noninflammatory disorders of vagina: Secondary | ICD-10-CM | POA: Diagnosis present

## 2023-08-17 DIAGNOSIS — J029 Acute pharyngitis, unspecified: Secondary | ICD-10-CM

## 2023-08-17 LAB — POCT RAPID STREP A (OFFICE): Rapid Strep A Screen: NEGATIVE

## 2023-08-17 NOTE — Patient Instructions (Signed)
I think you have a viral sore throat. Continue with warm drinks and can take tylenol or motrin. This should pass in 5-7 days. IF you developed a fever please follow up

## 2023-08-17 NOTE — Progress Notes (Signed)
Subjective:    Patient ID: Janet Sanchez, female    DOB: [birth year], 52 y.o.   MRN: [id number]  Sore Throat     53 year old female who  has a past medical history of Enlarged tonsils, Herpes, Hypertension, Migraines, Morbid obesity (HCC), and Vasovagal syncope (10/24/2017).  She presents to the office today for an acute issue. She reports that for the last two days she has had a sore throat with non productive cough. She does not have any pain with swallowing.   She denies any fevers, chills, or sinus pain/pressure.  She has been drinking hot tea with honey which helps    She also reports for an unknown amount of time that she has had a " different odor and discharge" from her vagina. She is not concerned for STDs but is wondering if she has BV.    Review of Systems See HPI   Past Medical History:  Diagnosis Date   Enlarged tonsils    Herpes    Hypertension    Migraines    Morbid obesity (HCC)    Vasovagal syncope 10/24/2017    Social History   Socioeconomic History   Marital status: Married    Spouse name: Not on file   Number of children: Not on file   Years of education: Not on file   Highest education level: Bachelor's degree (e.g., BA, AB, BS)  Occupational History   Not on file  Tobacco Use   Smoking status: Never   Smokeless tobacco: Never  Vaping Use   Vaping status: Never Used  Substance and Sexual Activity   Alcohol use: No   Drug use: No   Sexual activity: Yes    Birth control/protection: Surgical  Other Topics Concern   Not on file  Social History Narrative   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, broadcasting/film/video   Married for 24 years    Five children all live locally   Social Drivers of Corporate investment banker Strain: Low Risk  (02/28/2023)   Overall Financial Resource Strain (CARDIA)    Difficulty of Paying Living Expenses: Not hard at all  Food Insecurity: No Food Insecurity (02/28/2023)   Hunger Vital Sign    Worried About Running Out of Food in  the Last Year: Never true    Ran Out of Food in the Last Year: Never true  Transportation Needs: No Transportation Needs (02/28/2023)   PRAPARE - Administrator, Civil Service (Medical): No    Lack of Transportation (Non-Medical): No  Physical Activity: Insufficiently Active (02/28/2023)   Exercise Vital Sign    Days of Exercise per Week: 3 days    Minutes of Exercise per Session: 30 min  Stress: No Stress Concern Present (02/28/2023)   [employer] of Occupational Health - Occupational Stress Questionnaire    Feeling of Stress : Not at all  Social Connections: Socially Integrated (02/28/2023)   Social Connection and Isolation Panel [NHANES]    Frequency of Communication with Friends and Family: More than three times a week    Frequency of Social Gatherings with Friends and Family: More than three times a week    Attends Religious Services: More than 4 times per year    Active Member of [employer] or Organizations: Yes    Attends Engineer, structural: More than 4 times per year    Marital Status: Married  Catering manager Violence: Not on file    Past Surgical History:  Procedure Laterality Date   TUBAL LIGATION      Family History  Problem Relation Age of Onset   Heart attack Mother 12   Hypertension Father    Heart attack Father 47   Hypertension Sister    Hypertension Brother    Arrhythmia Brother    Hypertension Sister     Allergies  Allergen Reactions   Potassium-Containing Compounds Other (See Comments)    Severe cramps caused her to pass out.   Atorvastatin     Myalgia    Hctz [Hydrochlorothiazide] Other (See Comments)    Renal impairment    Simvastatin     Myalgia    Zetia [Ezetimibe] Other (See Comments)    Throat itching    Lisinopril Other (See Comments)    Headache     Current Outpatient Medications on File Prior to Visit  Medication Sig Dispense Refill   albuterol (VENTOLIN HFA) 108 (90 Base) MCG/ACT inhaler INHALE 2 PUFFS INTO THE  LUNGS EVERY 6 HOURS AS NEEDED FOR WHEEZING OR SHORTNESS OF BREATH 8.5 g 2   amLODipine (NORVASC) 10 MG tablet Take 10 mg by mouth daily.     aspirin EC 81 MG tablet Take 81 mg by mouth daily.     aspirin-acetaminophen-caffeine (EXCEDRIN MIGRAINE) 250-250-65 MG tablet Take by mouth every 6 (six) hours as needed for headache.     cetirizine (ZYRTEC) 10 MG tablet Take 10 mg by mouth daily.     cholecalciferol (VITAMIN D3) 25 MCG (1000 UNIT) tablet Take 5,000 Units by mouth daily.     ezetimibe (ZETIA) 10 MG tablet TAKE 1 TABLET BY MOUTH EVERY DAY 90 tablet 3   Ferrous Sulfate (IRON PO) Take by mouth.     fluticasone (FLONASE) 50 MCG/ACT nasal spray Place 1 spray into both nostrils daily. 16 g 0   gabapentin (NEURONTIN) 100 MG capsule Take 2 capsules (200 mg total) by mouth at bedtime. 180 capsule 3   irbesartan (AVAPRO) 300 MG tablet Take 300 mg by mouth daily.     Multiple Vitamins-Minerals (WOMENS 50+ ADVANCED PO) Take by mouth.     omeprazole (PRILOSEC) 20 MG capsule Take 1 capsule by mouth daily. Schedule physical for future refills 90 capsule 1   rosuvastatin (CRESTOR) 5 MG tablet TAKE 1 TABLET BY MOUTH EVERY DAY 90 tablet 3   valACYclovir (VALTREX) 1000 MG tablet take 1 tablet by mouth 2 times daily for 10 days as needed for flare 90 tablet 0   No current facility-administered medications on file prior to visit.    BP 130/84   Pulse (!) 101   Temp 98.6 F (37 C) (Oral)   Ht 5\' 3"  (1.6 m)   Wt 250 lb (113.4 kg)   SpO2 99%   BMI 44.29 kg/m       Objective:   Physical Exam Vitals reviewed.  HENT:     Nose: No congestion or rhinorrhea.     Right Turbinates: Not enlarged or swollen.     Left Turbinates: Not enlarged or swollen.     Right Sinus: No maxillary sinus tenderness or frontal sinus tenderness.     Left Sinus: No maxillary sinus tenderness or frontal sinus tenderness.     Mouth/Throat:     Mouth: Mucous membranes are moist.     Tonsils: No tonsillar exudate or  tonsillar abscesses.     Comments: Hypertrophic tonsils   Cardiovascular:     Rate and Rhythm: Normal rate and regular rhythm.  Pulses: Normal pulses.     Heart sounds: Normal heart sounds.  Pulmonary:     Effort: Pulmonary effort is normal.     Breath sounds: Normal breath sounds.  Musculoskeletal:        General: Normal range of motion.  Lymphadenopathy:     Head:     Right side of head: No submental, submandibular, tonsillar, preauricular, posterior auricular or occipital adenopathy.     Left side of head: No submental, submandibular, tonsillar, preauricular, posterior auricular or occipital adenopathy.  Skin:    General: Skin is warm and dry.     Capillary Refill: Capillary refill takes less than 2 seconds.  Neurological:     General: No focal deficit present.     Mental Status: She is oriented to person, place, and time.  Psychiatric:        Mood and Affect: Mood normal.        Behavior: Behavior normal.        Thought Content: Thought content normal.        Judgment: Judgment normal.        Assessment & Plan:  1. Sore throat (Primary)  - POC Rapid Strep A- negative - Likely viral sore throat. Advised warm drinks with honey. Can take tylenol or motrin for the next few days. If not improved by day 7, follow up in the office. Follow up sooner if fever develops or symptoms worsen   2. Vaginal odor  - Cervicovaginal ancillary only  Shirline Frees, NP

## 2023-08-22 ENCOUNTER — Encounter: Payer: Self-pay | Admitting: Adult Health

## 2023-08-22 LAB — CERVICOVAGINAL ANCILLARY ONLY
Bacterial Vaginitis (gardnerella): NEGATIVE
Candida Glabrata: NEGATIVE
Candida Vaginitis: NEGATIVE
Chlamydia: NEGATIVE
Comment: NEGATIVE
Comment: NEGATIVE
Comment: NEGATIVE
Comment: NEGATIVE
Comment: NEGATIVE
Comment: NORMAL
Neisseria Gonorrhea: NEGATIVE
Trichomonas: NEGATIVE

## 2023-10-01 ENCOUNTER — Emergency Department (HOSPITAL_COMMUNITY)
Admission: EM | Admit: 2023-10-01 | Discharge: 2023-10-01 | Discharge status: Against Medical Advice | Attending: Emergency Medicine | Admitting: Emergency Medicine

## 2023-10-01 ENCOUNTER — Other Ambulatory Visit: Payer: Self-pay

## 2023-10-01 ENCOUNTER — Encounter (HOSPITAL_COMMUNITY): Payer: Self-pay

## 2023-10-01 DIAGNOSIS — R002 Palpitations: Secondary | ICD-10-CM | POA: Insufficient documentation

## 2023-10-01 DIAGNOSIS — Z5321 Procedure and treatment not carried out due to patient leaving prior to being seen by health care provider: Secondary | ICD-10-CM | POA: Diagnosis not present

## 2023-10-01 DIAGNOSIS — R110 Nausea: Secondary | ICD-10-CM | POA: Insufficient documentation

## 2023-10-01 DIAGNOSIS — R030 Elevated blood-pressure reading, without diagnosis of hypertension: Secondary | ICD-10-CM | POA: Insufficient documentation

## 2023-10-01 LAB — CBC
HCT: 38.7 % (ref 36.0–46.0)
Hemoglobin: 12.4 g/dL (ref 12.0–15.0)
MCH: 29.2 pg (ref 26.0–34.0)
MCHC: 32 g/dL (ref 30.0–36.0)
MCV: 91.3 fL (ref 80.0–100.0)
Platelets: 263 10*3/uL (ref 150–400)
RBC: 4.24 MIL/uL (ref 3.87–5.11)
RDW: 13.2 % (ref 11.5–15.5)
WBC: 4 10*3/uL (ref 4.0–10.5)
nRBC: 0 % (ref 0.0–0.2)

## 2023-10-01 LAB — BASIC METABOLIC PANEL
Anion gap: 9 (ref 5–15)
BUN: 15 mg/dL (ref 6–20)
CO2: 27 mmol/L (ref 22–32)
Calcium: 9.7 mg/dL (ref 8.9–10.3)
Chloride: 103 mmol/L (ref 98–111)
Creatinine, Ser: 1.09 mg/dL — ABNORMAL HIGH (ref 0.44–1.00)
GFR, Estimated: >60 mL/min (ref >60)
Glucose, Bld: 95 mg/dL (ref 70–99)
Potassium: 3.7 mmol/L (ref 3.5–5.1)
Sodium: 139 mmol/L (ref 135–145)

## 2023-10-01 LAB — HCG, SERUM, QUALITATIVE: Preg, Serum: NEGATIVE

## 2023-10-01 LAB — TROPONIN I (HIGH SENSITIVITY): Troponin I (High Sensitivity): 3 ng/L (ref <18)

## 2023-10-01 NOTE — ED Provider Triage Note (Signed)
 Emergency Medicine Provider Triage Evaluation Note  Darling Cieslewicz , a 53 y.o. female  was evaluated in triage.  Pt complains of fluttering in her chest and hisgh blood pressure  Review of Systems  Positive: Right sided chest fluttering Negative: cough  Physical Exam  BP (!) 181/113 (BP Location: Left Arm)   Pulse 93   Temp 98 F (36.7 C) (Oral)   Resp 18   Ht 5\' 3"  (1.6 m)   Wt 111.6 kg   SpO2 100%   BMI 43.58 kg/m  Gen:   Awake, no distress   Resp:  Normal effort  MSK:   Moves extremities without difficulty  Other:    Medical Decision Making  Medically screening exam initiated at 12:30 PM.  Appropriate orders placed.  Derinda Sis Lopata was informed that the remainder of the evaluation will be completed by another provider, this initial triage assessment does not replace that evaluation, and the importance of remaining in the ED until their evaluation is complete.     Elson Areas, [place] 10/01/23 1231

## 2023-10-01 NOTE — ED Notes (Signed)
 Pt informed this Clinical research associate she is leaving and will see PCP tomorrow. Triage RN aware

## 2023-10-01 NOTE — ED Triage Notes (Addendum)
 Intermittent episodes of Heart fluttering/palpitations that started this AM that last about a minute or less each episode and pt reports BP is high, pt is on BP meds and has not missed any doses. C/o nausea.

## 2023-10-03 ENCOUNTER — Ambulatory Visit: Attending: Adult Health

## 2023-10-03 ENCOUNTER — Ambulatory Visit: Admitting: Adult Health

## 2023-10-03 VITALS — BP 120/82 | HR 88 | Temp 98.1°F | Wt 244.0 lb

## 2023-10-03 DIAGNOSIS — R002 Palpitations: Secondary | ICD-10-CM | POA: Diagnosis not present

## 2023-10-03 DIAGNOSIS — I10 Essential (primary) hypertension: Secondary | ICD-10-CM | POA: Diagnosis not present

## 2023-10-03 DIAGNOSIS — R7989 Other specified abnormal findings of blood chemistry: Secondary | ICD-10-CM | POA: Diagnosis not present

## 2023-10-03 LAB — BASIC METABOLIC PANEL
BUN: 17 mg/dL (ref 6–23)
CO2: 30 meq/L (ref 19–32)
Calcium: 10 mg/dL (ref 8.4–10.5)
Chloride: 99 meq/L (ref 96–112)
Creatinine, Ser: 1.09 mg/dL (ref 0.40–1.20)
GFR: 58.36 mL/min — ABNORMAL LOW (ref >60.00)
Glucose, Bld: 88 mg/dL (ref 70–99)
Potassium: 3.6 meq/L (ref 3.5–5.1)
Sodium: 137 meq/L (ref 135–145)

## 2023-10-03 NOTE — Progress Notes (Signed)
 Subjective:    Patient ID: Janet Sanchez, female    DOB: July 11, 1971, 53 y.o.   MRN: [id number]  HPI 53 year old female who  has a past medical history of Enlarged tonsils, Herpes, Hypertension, Migraines, Morbid obesity (HCC), and Vasovagal syncope (10/24/2017).  She presents to the office today for follow. She went to the ER two days ago for a fluttering sensation in her chest and high blood pressure but left prior to a full work up due to the wait. Her initial blood pressure in the ER was 181/113. Labs showed an elevated Cr.1.09 with her baseline around 1.06.  EKG showed SR with rate 96  She is seen by Nephrology and is currently prescribed Norvasc 10 and telmisartan 80 mg/ 12.5 mg hydrochlorothiazide daily. She has had allergies and intolerances to multiple medications in the past.   Since leaving the ER she has checked her BP at home with one reading in the 107 systolic range and prior to this appointment it was 133/90  Her diet has been higher in sodium lately.   BP Readings from Last 3 Encounters:  10/03/23 (!) 120/90  10/01/23 (!) 181/113  08/17/23 130/84   She does continue to have palpitations on and off. These episodes do not last very long but are concerning for her    Review of Systems See HPI   Past Medical History:  Diagnosis Date   Enlarged tonsils    Herpes    Hypertension    Migraines    Morbid obesity (HCC)    Vasovagal syncope 10/24/2017    Social History   Socioeconomic History   Marital status: Married    Spouse name: Not on file   Number of children: Not on file   Years of education: Not on file   Highest education level: Bachelor's degree (e.g., BA, AB, BS)  Occupational History   Not on file  Tobacco Use   Smoking status: Never   Smokeless tobacco: Never  Vaping Use   Vaping status: Never Used  Substance and Sexual Activity   Alcohol use: No   Drug use: No   Sexual activity: Yes    Birth control/protection: Surgical  Other Topics  Concern   Not on file  Social History Narrative   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, broadcasting/film/video   Married for 24 years    Five children all live locally   Social Drivers of Corporate investment banker Strain: Low Risk  (02/28/2023)   Overall Financial Resource Strain (CARDIA)    Difficulty of Paying Living Expenses: Not hard at all  Food Insecurity: No Food Insecurity (02/28/2023)   Hunger Vital Sign    Worried About Running Out of Food in the Last Year: Never true    Ran Out of Food in the Last Year: Never true  Transportation Needs: No Transportation Needs (02/28/2023)   PRAPARE - Administrator, Civil Service (Medical): No    Lack of Transportation (Non-Medical): No  Physical Activity: Insufficiently Active (02/28/2023)   Exercise Vital Sign    Days of Exercise per Week: 3 days    Minutes of Exercise per Session: 30 min  Stress: No Stress Concern Present (02/28/2023)   [employer] of Occupational Health - Occupational Stress Questionnaire    Feeling of Stress : Not at all  Social Connections: Socially Integrated (02/28/2023)   Social Connection and Isolation Panel [NHANES]    Frequency of Communication with Friends and Family: More than  three times a week    Frequency of Social Gatherings with Friends and Family: More than three times a week    Attends Religious Services: More than 4 times per year    Active Member of [employer] or Organizations: Yes    Attends Engineer, structural: More than 4 times per year    Marital Status: Married  Catering manager Violence: Not on file    Past Surgical History:  Procedure Laterality Date   TUBAL LIGATION      Family History  Problem Relation Age of Onset   Heart attack Mother 60   Hypertension Father    Heart attack Father 3   Hypertension Sister    Hypertension Brother    Arrhythmia Brother    Hypertension Sister     Allergies  Allergen Reactions   Potassium-Containing Compounds Other (See Comments)    Severe cramps  caused her to pass out.   Atorvastatin     Myalgia    Hctz [Hydrochlorothiazide] Other (See Comments)    Renal impairment    Simvastatin     Myalgia    Zetia [Ezetimibe] Other (See Comments)    Throat itching    Lisinopril Other (See Comments)    Headache     Current Outpatient Medications on File Prior to Visit  Medication Sig Dispense Refill   albuterol (VENTOLIN HFA) 108 (90 Base) MCG/ACT inhaler INHALE 2 PUFFS INTO THE LUNGS EVERY 6 HOURS AS NEEDED FOR WHEEZING OR SHORTNESS OF BREATH 8.5 g 2   amLODipine (NORVASC) 10 MG tablet Take 10 mg by mouth daily.     aspirin-acetaminophen-caffeine (EXCEDRIN MIGRAINE) 250-250-65 MG tablet Take by mouth every 6 (six) hours as needed for headache.     cetirizine (ZYRTEC) 10 MG tablet Take 10 mg by mouth daily.     cholecalciferol (VITAMIN D3) 25 MCG (1000 UNIT) tablet Take 5,000 Units by mouth daily.     ezetimibe (ZETIA) 10 MG tablet TAKE 1 TABLET BY MOUTH EVERY DAY 90 tablet 3   Ferrous Sulfate (IRON PO) Take by mouth.     fluticasone (FLONASE) 50 MCG/ACT nasal spray Place 1 spray into both nostrils daily. 16 g 0   gabapentin (NEURONTIN) 100 MG capsule Take 2 capsules (200 mg total) by mouth at bedtime. 180 capsule 3   Multiple Vitamins-Minerals (WOMENS 50+ ADVANCED PO) Take by mouth.     omeprazole (PRILOSEC) 20 MG capsule Take 1 capsule by mouth daily. Schedule physical for future refills 90 capsule 1   rosuvastatin (CRESTOR) 5 MG tablet TAKE 1 TABLET BY MOUTH EVERY DAY 90 tablet 3   telmisartan-hydrochlorothiazide (MICARDIS HCT) 80-12.5 MG tablet Take 1 tablet by mouth daily.     valACYclovir (VALTREX) 1000 MG tablet take 1 tablet by mouth 2 times daily for 10 days as needed for flare 90 tablet 0   No current facility-administered medications on file prior to visit.    BP (!) 120/90   Pulse 88   Temp 98.1 F (36.7 C) (Oral)   Wt 244 lb (110.7 kg)   SpO2 97%   BMI 43.22 kg/m       Objective:   Physical Exam Vitals and nursing  note reviewed.  Constitutional:      Appearance: Normal appearance.  Cardiovascular:     Rate and Rhythm: Normal rate and regular rhythm.     Pulses: Normal pulses.     Heart sounds: Normal heart sounds.  Pulmonary:     Effort: Pulmonary effort is normal.  Breath sounds: Normal breath sounds.  Skin:    General: Skin is warm and dry.  Neurological:     General: No focal deficit present.     Mental Status: She is alert and oriented to person, place, and time.  Psychiatric:        Mood and Affect: Mood normal.        Behavior: Behavior normal.        Thought Content: Thought content normal.        Judgment: Judgment normal.        Assessment & Plan:  1. Primary hypertension (Primary) - BP back to baseline after ER visit. I believe excessive sodium intake played a role in her BP spiking. Encouraged to refrain from sodium and eating out. Continue to monitor at home - Follow up with nephrology as directed  2. Palpitations - not present on exam. Will order Zio patch x 3 days.  - LONG TERM MONITOR (3-14 DAYS); Future  3. Elevated serum creatinine - Basic Metabolic Panel; Future - Basic Metabolic Panel  Shirline Frees, NP  Time spent with patient today was 34 minutes which consisted of chart review, discussing hypertension, kidney function and palpitations, work up, treatment answering questions and documentation.

## 2023-10-03 NOTE — Progress Notes (Unsigned)
 EP to read.

## 2023-10-04 ENCOUNTER — Encounter: Payer: Self-pay | Admitting: Adult Health

## 2023-10-06 DIAGNOSIS — R002 Palpitations: Secondary | ICD-10-CM

## 2023-10-26 ENCOUNTER — Encounter: Payer: Self-pay | Admitting: Adult Health

## 2023-11-16 ENCOUNTER — Ambulatory Visit: Admitting: Adult Health

## 2023-11-16 ENCOUNTER — Other Ambulatory Visit (HOSPITAL_COMMUNITY)
Admission: RE | Admit: 2023-11-16 | Discharge: 2023-11-16 | Disposition: A | Discharge status: Home / Self Care | Source: Ambulatory Visit | Attending: Adult Health | Admitting: Adult Health

## 2023-11-16 VITALS — BP 100/80 | Temp 98.5°F | Ht 63.0 in | Wt 244.0 lb

## 2023-11-16 DIAGNOSIS — I10 Essential (primary) hypertension: Secondary | ICD-10-CM

## 2023-11-16 DIAGNOSIS — E559 Vitamin D deficiency, unspecified: Secondary | ICD-10-CM | POA: Diagnosis not present

## 2023-11-16 DIAGNOSIS — E66813 Obesity, class 3: Secondary | ICD-10-CM

## 2023-11-16 DIAGNOSIS — Z124 Encounter for screening for malignant neoplasm of cervix: Secondary | ICD-10-CM | POA: Insufficient documentation

## 2023-11-16 DIAGNOSIS — Z0000 Encounter for general adult medical examination without abnormal findings: Secondary | ICD-10-CM

## 2023-11-16 DIAGNOSIS — N899 Noninflammatory disorder of vagina, unspecified: Secondary | ICD-10-CM

## 2023-11-16 DIAGNOSIS — K2100 Gastro-esophageal reflux disease with esophagitis, without bleeding: Secondary | ICD-10-CM | POA: Diagnosis not present

## 2023-11-16 DIAGNOSIS — E785 Hyperlipidemia, unspecified: Secondary | ICD-10-CM | POA: Diagnosis not present

## 2023-11-16 LAB — CBC WITH DIFFERENTIAL/PLATELET
Basophils Absolute: 0 10*3/uL (ref 0.0–0.1)
Basophils Relative: 0.4 % (ref 0.0–3.0)
Eosinophils Absolute: 0.1 10*3/uL (ref 0.0–0.7)
Eosinophils Relative: 1.5 % (ref 0.0–5.0)
HCT: 38 % (ref 36.0–46.0)
Hemoglobin: 12.5 g/dL (ref 12.0–15.0)
Lymphocytes Relative: 28.1 % (ref 12.0–46.0)
Lymphs Abs: 1.3 10*3/uL (ref 0.7–4.0)
MCHC: 32.9 g/dL (ref 30.0–36.0)
MCV: 90.5 fl (ref 78.0–100.0)
Monocytes Absolute: 0.4 10*3/uL (ref 0.1–1.0)
Monocytes Relative: 8.1 % (ref 3.0–12.0)
Neutro Abs: 2.8 10*3/uL (ref 1.4–7.7)
Neutrophils Relative %: 61.9 % (ref 43.0–77.0)
Platelets: 299 10*3/uL (ref 150.0–400.0)
RBC: 4.19 Mil/uL (ref 3.87–5.11)
RDW: 13.7 % (ref 11.5–15.5)
WBC: 4.5 10*3/uL (ref 4.0–10.5)

## 2023-11-16 LAB — LIPID PANEL
Cholesterol: 171 mg/dL (ref 0–200)
HDL: 66.1 mg/dL (ref >39.00)
LDL Cholesterol: 91 mg/dL (ref 0–99)
NonHDL: 105.16
Total CHOL/HDL Ratio: 3
Triglycerides: 72 mg/dL (ref 0.0–149.0)
VLDL: 14.4 mg/dL (ref 0.0–40.0)

## 2023-11-16 LAB — COMPREHENSIVE METABOLIC PANEL WITH GFR
ALT: 18 U/L (ref 0–35)
AST: 23 U/L (ref 0–37)
Albumin: 4.2 g/dL (ref 3.5–5.2)
Alkaline Phosphatase: 66 U/L (ref 39–117)
BUN: 16 mg/dL (ref 6–23)
CO2: 31 meq/L (ref 19–32)
Calcium: 9.8 mg/dL (ref 8.4–10.5)
Chloride: 100 meq/L (ref 96–112)
Creatinine, Ser: 1.12 mg/dL (ref 0.40–1.20)
GFR: 56.44 mL/min — ABNORMAL LOW (ref >60.00)
Glucose, Bld: 80 mg/dL (ref 70–99)
Potassium: 3.7 meq/L (ref 3.5–5.1)
Sodium: 138 meq/L (ref 135–145)
Total Bilirubin: 0.6 mg/dL (ref 0.2–1.2)
Total Protein: 8.1 g/dL (ref 6.0–8.3)

## 2023-11-16 LAB — IBC + FERRITIN
Ferritin: 8.4 ng/mL — ABNORMAL LOW (ref 10.0–291.0)
Iron: 52 ug/dL (ref 42–145)
Saturation Ratios: 12.3 % — ABNORMAL LOW (ref 20.0–50.0)
TIBC: 421.4 ug/dL (ref 250.0–450.0)
Transferrin: 301 mg/dL (ref 212.0–360.0)

## 2023-11-16 LAB — VITAMIN D 25 HYDROXY (VIT D DEFICIENCY, FRACTURES): VITD: 92.36 ng/mL (ref 30.00–100.00)

## 2023-11-16 LAB — TSH: TSH: 1.15 u[IU]/mL (ref 0.35–5.50)

## 2023-11-16 NOTE — Progress Notes (Signed)
 Subjective:    Patient ID: Janet Sanchez, female    DOB: 08-27-1970, 53 y.o.   MRN: [id number]  HPI Patient presents for yearly preventative medicine examination. She is a pleasant 53 year old female who  has a past medical history of Enlarged tonsils, Herpes, Hypertension, Migraines, Morbid obesity (HCC), and Vasovagal syncope (10/24/2017).  HTN/CKD II -managed by nephrology.  Currently prescribed Norvasc  10 mg daily and Micardis HCT 80-12.5 mg  BP Readings from Last 3 Encounters:  11/16/23 100/80  10/03/23 120/82  10/01/23 (!) 181/113   GERD- prescribed Prilosec 20 mg daily   Obesity - She has not been able to lose much weight, she does not eat out much and cooks meals at home, but does not exercise much outside of walking. Wt Readings from Last 3 Encounters:  11/16/23 244 lb (110.7 kg)  10/03/23 244 lb (110.7 kg)  10/01/23 246 lb (111.6 kg)   Hyperlipidemia -managed with Zetia  10 mg daily. She has been intolerant to multiple statins  Lab Results  Component Value Date   CHOL 154 09/21/2022   HDL 62.60 09/21/2022   LDLCALC 80 09/21/2022   TRIG 57.0 09/21/2022   CHOLHDL 2 09/21/2022   Vitamin D  Deficiency - takes Vitamin D  - 5000 units daily.  Last vitamin D  Lab Results  Component Value Date   VD25OH 71.87 03/21/2022    All immunizations and health maintenance protocols were reviewed with the patient and needed orders were placed.  Appropriate screening laboratory values were ordered for the patient including screening of hyperlipidemia, renal function and hepatic function.  Medication reconciliation,  past medical history, social history, problem list and allergies were reviewed in detail with the patient  Goals were established with regard to weight loss, exercise, and  diet in compliance with medications  She is up to date on routine colon cancer screening. She will call and schedule her mammogram. We will do her GYN exam today.   Review of Systems   Constitutional: Negative.   HENT: Negative.    Eyes: Negative.   Respiratory: Negative.    Cardiovascular: Negative.   Gastrointestinal: Negative.   Endocrine: Negative.   Genitourinary: Negative.   Musculoskeletal: Negative.   Skin: Negative.   Allergic/Immunologic: Negative.   Neurological: Negative.   Hematological: Negative.   Psychiatric/Behavioral: Negative.     Past Medical History:  Diagnosis Date   Enlarged tonsils    Herpes    Hypertension    Migraines    Morbid obesity (HCC)    Vasovagal syncope 10/24/2017    Social History   Socioeconomic History   Marital status: Married    Spouse name: Not on file   Number of children: Not on file   Years of education: Not on file   Highest education level: Bachelor's degree (e.g., BA, AB, BS)  Occupational History   Not on file  Tobacco Use   Smoking status: Never   Smokeless tobacco: Never  Vaping Use   Vaping status: Never Used  Substance and Sexual Activity   Alcohol use: No   Drug use: No   Sexual activity: Yes    Birth control/protection: Surgical  Other Topics Concern   Not on file  Social History Narrative   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, broadcasting/film/video   Married for 24 years    Five children all live locally   Social Drivers of Corporate investment banker Strain: Low Risk  (11/16/2023)   Overall Financial Resource Strain (CARDIA)  Difficulty of Paying Living Expenses: Not hard at all  Food Insecurity: No Food Insecurity (11/16/2023)   Hunger Vital Sign    Worried About Running Out of Food in the Last Year: Never true    Ran Out of Food in the Last Year: Never true  Transportation Needs: No Transportation Needs (11/16/2023)   PRAPARE - Administrator, Civil Service (Medical): No    Lack of Transportation (Non-Medical): No  Physical Activity: Insufficiently Active (11/16/2023)   Exercise Vital Sign    Days of Exercise per Week: 2 days    Minutes of Exercise per Session: 30 min  Stress: No  Stress Concern Present (11/16/2023)   [employer] of Occupational Health - Occupational Stress Questionnaire    Feeling of Stress : Not at all  Social Connections: Socially Integrated (11/16/2023)   Social Connection and Isolation Panel [NHANES]    Frequency of Communication with Friends and Family: More than three times a week    Frequency of Social Gatherings with Friends and Family: Once a week    Attends Religious Services: More than 4 times per year    Active Member of [employer] or Organizations: No    Attends Engineer, structural: More than 4 times per year    Marital Status: Married  Catering manager Violence: Not on file    Past Surgical History:  Procedure Laterality Date   TUBAL LIGATION      Family History  Problem Relation Age of Onset   Heart attack Mother 58   Hypertension Father    Heart attack Father 72   Hypertension Sister    Hypertension Brother    Arrhythmia Brother    Hypertension Sister     Allergies  Allergen Reactions   Potassium-Containing Compounds Other (See Comments)    Severe cramps caused her to pass out.   Atorvastatin      Myalgia    Hctz [Hydrochlorothiazide ] Other (See Comments)    Renal impairment    Simvastatin      Myalgia    Zetia  [Ezetimibe ] Other (See Comments)    Throat itching    Lisinopril  Other (See Comments)    Headache     Current Outpatient Medications on File Prior to Visit  Medication Sig Dispense Refill   albuterol  (VENTOLIN  HFA) 108 (90 Base) MCG/ACT inhaler INHALE 2 PUFFS INTO THE LUNGS EVERY 6 HOURS AS NEEDED FOR WHEEZING OR SHORTNESS OF BREATH 8.5 g 2   amLODipine  (NORVASC ) 10 MG tablet Take 10 mg by mouth daily.     aspirin-acetaminophen -caffeine (EXCEDRIN MIGRAINE) 250-250-65 MG tablet Take by mouth every 6 (six) hours as needed for headache.     cetirizine (ZYRTEC) 10 MG tablet Take 10 mg by mouth daily.     cholecalciferol (VITAMIN D3) 25 MCG (1000 UNIT) tablet Take 5,000 Units by mouth daily.      ezetimibe  (ZETIA ) 10 MG tablet TAKE 1 TABLET BY MOUTH EVERY DAY 90 tablet 3   Ferrous Sulfate (IRON PO) Take by mouth.     fluticasone  (FLONASE ) 50 MCG/ACT nasal spray Place 1 spray into both nostrils daily. 16 g 0   gabapentin  (NEURONTIN ) 100 MG capsule Take 2 capsules (200 mg total) by mouth at bedtime. 180 capsule 3   Multiple Vitamins-Minerals (WOMENS 50+ ADVANCED PO) Take by mouth.     omeprazole  (PRILOSEC) 20 MG capsule Take 1 capsule by mouth daily. Schedule physical for future refills 90 capsule 1   rosuvastatin  (CRESTOR ) 5 MG tablet TAKE 1 TABLET BY  MOUTH EVERY DAY 90 tablet 3   telmisartan-hydrochlorothiazide  (MICARDIS HCT) 80-12.5 MG tablet Take 1 tablet by mouth daily.     valACYclovir  (VALTREX ) 1000 MG tablet take 1 tablet by mouth 2 times daily for 10 days as needed for flare 90 tablet 0   No current facility-administered medications on file prior to visit.    BP 100/80   Temp 98.5 F (36.9 C) (Oral)   Ht 5\' 3"  (1.6 m)   Wt 244 lb (110.7 kg)   BMI 43.22 kg/m       Objective:   Physical Exam Vitals and nursing note reviewed.  Constitutional:      General: She is not in acute distress.    Appearance: Normal appearance. She is obese. She is not ill-appearing.  HENT:     Head: Normocephalic and atraumatic.     Right Ear: Tympanic membrane, ear canal and external ear normal. There is no impacted cerumen.     Left Ear: Tympanic membrane, ear canal and external ear normal. There is no impacted cerumen.     Nose: Nose normal. No congestion or rhinorrhea.     Mouth/Throat:     Mouth: Mucous membranes are moist.     Pharynx: Oropharynx is clear.  Eyes:     Extraocular Movements: Extraocular movements intact.     Conjunctiva/sclera: Conjunctivae normal.     Pupils: Pupils are equal, round, and reactive to light.  Neck:     Vascular: No carotid bruit.  Cardiovascular:     Rate and Rhythm: Normal rate and regular rhythm.     Pulses: Normal pulses.     Heart sounds: No  murmur heard.    No friction rub. No gallop.  Pulmonary:     Effort: Pulmonary effort is normal.     Breath sounds: Normal breath sounds.  Abdominal:     General: Abdomen is flat. Bowel sounds are normal. There is no distension.     Palpations: Abdomen is soft. There is no mass.     Tenderness: There is no abdominal tenderness. There is no guarding or rebound.     Hernia: No hernia is present.  Genitourinary:    Vagina: Normal.     Uterus: Normal.      Adnexa: Right adnexa normal and left adnexa normal.     Musculoskeletal:        General: Normal range of motion.     Cervical back: Normal range of motion and neck supple.  Lymphadenopathy:     Cervical: No cervical adenopathy.  Skin:    General: Skin is warm and dry.     Capillary Refill: Capillary refill takes less than 2 seconds.  Neurological:     General: No focal deficit present.     Mental Status: She is alert and oriented to person, place, and time.  Psychiatric:        Mood and Affect: Mood normal.        Behavior: Behavior normal.        Thought Content: Thought content normal.        Judgment: Judgment normal.        Assessment & Plan:  1. Routine general medical examination at a health care facility (Primary) Today patient counseled on age appropriate routine health concerns for screening and prevention, each reviewed and up to date or declined. Immunizations reviewed and up to date or declined. Labs ordered and reviewed. Risk factors for depression reviewed and negative. Hearing function and visual acuity are  intact. ADLs screened and addressed as needed. Functional ability and level of safety reviewed and appropriate. Education, counseling and referrals performed based on assessed risks today. Patient provided with a copy of personalized plan for preventive services. - Follow up in one year or sooner if needed  2. Primary hypertension - Well controlled. Follow up with Nephrology as directed - CBC with  Differential/Platelet; Future - Comprehensive metabolic panel with GFR; Future - Lipid panel; Future - TSH; Future  3. Gastroesophageal reflux disease with esophagitis without hemorrhage - Continue PPI  - CBC with Differential/Platelet; Future - Comprehensive metabolic panel with GFR; Future - Lipid panel; Future - TSH; Future  4. Class 3 obesity - Encouraged lifestyle modifications for weight loss. Her insurance does not cover GLP  - CBC with Differential/Platelet; Future - Comprehensive metabolic panel with GFR; Future - Lipid panel; Future - TSH; Future - VITAMIN D  25 Hydroxy (Vit-D Deficiency, Fractures); Future - IBC + Ferritin; Future  5. Hyperlipidemia, unspecified hyperlipidemia type - Continue with Zetia   - CBC with Differential/Platelet; Future - Comprehensive metabolic panel with GFR; Future - Lipid panel; Future - TSH; Future  6. Vitamin D  deficiency  - VITAMIN D  25 Hydroxy (Vit-D Deficiency, Fractures); Future  7. Screening for cervical cancer - Cytology - PAP  8. Vaginal disorder  - Ambulatory referral to Obstetrics / Gynecology  Alto Atta, NP

## 2023-11-20 ENCOUNTER — Encounter: Payer: Self-pay | Admitting: Adult Health

## 2023-11-21 LAB — CYTOLOGY - PAP
Comment: NEGATIVE
Diagnosis: UNDETERMINED — AB
High risk HPV: NEGATIVE

## 2023-11-26 ENCOUNTER — Other Ambulatory Visit: Payer: Self-pay | Admitting: Adult Health

## 2023-12-12 ENCOUNTER — Encounter: Admitting: Obstetrics and Gynecology

## 2023-12-20 ENCOUNTER — Telehealth: Payer: Self-pay | Admitting: *Deleted

## 2023-12-20 NOTE — Telephone Encounter (Signed)
 Called pt multiple times. Will route to provider for advise.

## 2023-12-20 NOTE — Telephone Encounter (Signed)
 Spoke to pt and she is distraught that she was given the news that she has Kidney disease. Pt stated no one has ever told her this and she is wanting to know why. Pt claimed that labs were looked over by nephrologist and she is not liking what she heard. Advised pt that I will send to [facility] for advise.

## 2023-12-20 NOTE — Telephone Encounter (Signed)
 Copied from CRM [id number]. Topic: Clinical - Medical Advice >> Dec 20, 2023  1:32 PM Alyse July wrote: Reason for CRM: Patient would like a call back from provider or providers nurse pertaining to information she was informed of at her kidney specialist. Patient was informed that she has kidney disease and was previously not made aware of this by the referring provider. Patient was under the impression she was visiting the kidney specialist to help regulate her blood pressure. Please contact patient to advise [phone]

## 2023-12-21 LAB — LAB REPORT - SCANNED
Creatinine, POC: 32.5 mg/dL
EGFR: 57

## 2023-12-21 NOTE — Telephone Encounter (Signed)
 Noted

## 2024-01-18 ENCOUNTER — Encounter: Admitting: Obstetrics and Gynecology

## 2024-03-12 ENCOUNTER — Other Ambulatory Visit: Payer: Self-pay | Admitting: Adult Health

## 2024-03-12 DIAGNOSIS — R238 Other skin changes: Secondary | ICD-10-CM

## 2024-03-14 ENCOUNTER — Encounter: Admitting: Obstetrics and Gynecology

## 2024-03-21 ENCOUNTER — Ambulatory Visit (INDEPENDENT_AMBULATORY_CARE_PROVIDER_SITE_OTHER): Admitting: Podiatry

## 2024-03-21 ENCOUNTER — Encounter: Payer: Self-pay | Admitting: Podiatry

## 2024-03-21 ENCOUNTER — Ambulatory Visit (INDEPENDENT_AMBULATORY_CARE_PROVIDER_SITE_OTHER)

## 2024-03-21 DIAGNOSIS — M722 Plantar fascial fibromatosis: Secondary | ICD-10-CM

## 2024-03-21 MED ORDER — TRIAMCINOLONE ACETONIDE 10 MG/ML IJ SUSP
5.0000 mg | Freq: Once | INTRAMUSCULAR | Status: AC
  Administered 2024-03-21: 5 mg via INTRAMUSCULAR

## 2024-03-21 NOTE — Patient Instructions (Signed)

## 2024-03-21 NOTE — Progress Notes (Signed)
 Subjective:  Patient ID: Janet Sanchez, female    DOB: 1971/07/03,  MRN: [id number]  Chief Complaint  Patient presents with   Plantar Fasciitis    Right foot heel pain x 1 week. 7 pain with pressure and throbbing when sleeping. Als states numbness in the middle of heel. Non diabetic. 0 treatment.    Discussed the use of AI scribe software for clinical note transcription with the patient, who gave verbal consent to proceed.  History of Present Illness Janet Sanchez is a 53 year old female who presents with right heel pain.  She has experienced right heel pain for the past week, which began suddenly without any known injury. The pain is localized to the right heel with associated numbness in the middle of the foot. The left foot is starting to hurt on the side, possibly due to an altered walking pattern. There is no swelling or changes in the appearance of the foot.  She has not attempted any treatment yet and avoids ibuprofen  due to high blood pressure, opting for Tylenol  instead. 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 There have been no recent changes in shoes or activities.  The pain is more pronounced in the mornings when she is on her feet and eases at night when resting. Numbness in the middle of the foot occurs while in bed. There are no sharp pains up the leg or in the foot, and no pain on the top of the foot. The pain stops at the toes.      Objective:    Physical Exam General: AAO x3, NAD  Dermatological: Skin is warm, dry and supple bilateral. There are no open sores, no preulcerative lesions, no rash or signs of infection present.  Vascular: Dorsalis Pedis artery and Posterior Tibial artery pedal pulses are 2/4 bilateral with immedate capillary fill time.  There is no pain with calf compression, swelling, warmth, erythema.   Neruologic: Grossly intact via light touch  bilateral.  Negative Tinel's sign.  Musculoskeletal: There is tenderness palpation of the plantar aspect the calcaneal insertion of plantar fascia on the right heel.  There is mild discomfort the arch of the foot as well.  There is no pain with lateral compression of the calcaneus.  There is no area pinpoint tenderness.  MMT 5/5.  Gait: Unassisted, Nonantalgic.     No images are attached to the encounter.    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    Assessment:   1. Plantar fasciitis of right foot      Plan:  Patient was evaluated and treated and all questions answered.  Assessment and Plan Assessment & Plan Right plantar fasciitis and associated muscle strain Acute right heel pain due to plantar fasciitis and muscle strain. 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 - Administered steroid injection to right heel with informed consent.  See procedure note below. - Instructed on daily icing using ice pack or frozen water bottle. - Provided exercises to stretch calf muscle, Achilles tendon, and plantar fascia. - Recommended shoes with good arch support: [place], [employer], [place]. - Referred to [employer] [facility] for shoe fitting and measurement. - Applied KT tape for support.  Follow-Up - Follow up if symptoms persist or worsen after one week for consideration of oral steroids.  Procedure: Injection Tendon/Ligament  Discussed alternatives, risks, complications and verbal consent was obtained.  Location: Right plantar fascia at the glabrous junction; medial approach. Skin Prep: Alcohol. Injectate: 0.5cc 0.5% marcaine plain, 0.5 cc 2% lidocaine plain and, 1 cc kenalog  10. Disposition: Patient tolerated procedure well. Injection site dressed with a band-aid.  Post-injection care was discussed and return precautions discussed.    No  follow-ups on file.   Donnice JONELLE Fees DPM

## 2024-03-31 ENCOUNTER — Encounter: Admitting: Obstetrics and Gynecology

## 2024-04-07 ENCOUNTER — Ambulatory Visit: Admitting: Podiatry

## 2024-05-20 ENCOUNTER — Other Ambulatory Visit: Payer: Self-pay | Admitting: Adult Health

## 2024-05-20 ENCOUNTER — Encounter: Admitting: Advanced Practice Midwife

## 2024-05-29 ENCOUNTER — Ambulatory Visit (INDEPENDENT_AMBULATORY_CARE_PROVIDER_SITE_OTHER): Admitting: Adult Health

## 2024-05-29 ENCOUNTER — Encounter: Payer: Self-pay | Admitting: Adult Health

## 2024-05-29 ENCOUNTER — Other Ambulatory Visit (HOSPITAL_COMMUNITY)
Admission: RE | Admit: 2024-05-29 | Discharge: 2024-05-29 | Disposition: A | Source: Ambulatory Visit | Attending: Adult Health | Admitting: Adult Health

## 2024-05-29 VITALS — BP 132/82 | HR 78 | Temp 98.6°F | Wt 242.0 lb

## 2024-05-29 DIAGNOSIS — R109 Unspecified abdominal pain: Secondary | ICD-10-CM

## 2024-05-29 DIAGNOSIS — N898 Other specified noninflammatory disorders of vagina: Secondary | ICD-10-CM

## 2024-05-29 DIAGNOSIS — R300 Dysuria: Secondary | ICD-10-CM | POA: Insufficient documentation

## 2024-05-29 DIAGNOSIS — M545 Low back pain, unspecified: Secondary | ICD-10-CM | POA: Diagnosis not present

## 2024-05-29 LAB — POCT URINALYSIS DIPSTICK
Bilirubin, UA: NEGATIVE
Blood, UA: NEGATIVE
Glucose, UA: NEGATIVE
Ketones, UA: NEGATIVE
Nitrite, UA: NEGATIVE
Protein, UA: NEGATIVE
Spec Grav, UA: 1.01 (ref 1.010–1.025)
Urobilinogen, UA: NEGATIVE U/dL — AB

## 2024-05-29 MED ORDER — CYCLOBENZAPRINE HCL 10 MG PO TABS: 10.0000 mg | ORAL_TABLET | Freq: Every day | ORAL | 0 refills | Status: AC

## 2024-05-29 MED ORDER — FLUCONAZOLE 150 MG PO TABS: 150.0000 mg | ORAL_TABLET | Freq: Every day | ORAL | 1 refills | Status: AC

## 2024-05-29 NOTE — Progress Notes (Signed)
 Subjective:    Patient ID: Janet Sanchez, female    DOB: [birth year], 53 y.o.   MRN: [id number]  Dysuria    Discussed the use of AI scribe software for clinical note transcription with the patient, who gave verbal consent to proceed.  History of Present Illness   Janet Sanchez is a 53 year old female who presents to the office today for multiple issues.    Right sided low back pain that started about two weeks ago. Pain is worse when laying in bed on the right side and is relieved when she switches to laying on her left side. Change in positions and sitting for long periods of time also causes pain in her right lower back. Stretching relieves the pain.   Vaginal symptoms include itching, odor, and a slight yellow discharge for about a week. The odor is described as 'metal type'. She uses soap in the vaginal area, which might be causing irritation.   She also has been experiencing lower abdominal cramping. She does get associated cramping prior to her menstrual cycle. Her current cycle is due this week. She denies dysuria, frequent, urgency or hematuria.        Review of Systems  Genitourinary:  Positive for dysuria.   See HPI   Past Medical History:  Diagnosis Date   Enlarged tonsils    Herpes    Hypertension    Migraines    Morbid obesity (HCC)    Vasovagal syncope 10/24/2017    Social History   Socioeconomic History   Marital status: Married    Spouse name: Not on file   Number of children: Not on file   Years of education: Not on file   Highest education level: Bachelor's degree (e.g., BA, AB, BS)  Occupational History   Not on file  Tobacco Use   Smoking status: Never   Smokeless tobacco: Never  Vaping Use   Vaping status: Never Used  Substance and Sexual Activity   Alcohol use: No   Drug use: No   Sexual activity: Yes    Birth control/protection: Surgical  Other Topics Concern   Not on file  Social History Narrative   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, broadcasting/film/video   Married for 24 years    Five children all live locally   Social Drivers of Corporate Investment Banker Strain: Low Risk  (11/16/2023)   Overall Financial Resource Strain (CARDIA)    Difficulty of Paying Living Expenses: Not hard at all  Food Insecurity: No Food Insecurity (11/16/2023)   Hunger Vital Sign    Worried About Running Out of Food in the Last Year: Never true    Ran Out of Food in the Last Year: Never true  Transportation Needs: No Transportation Needs (11/16/2023)   PRAPARE - Administrator, Civil Service (Medical): No    Lack of Transportation (Non-Medical): No  Physical Activity: Insufficiently Active (11/16/2023)   Exercise Vital Sign    Days of Exercise per Week: 2 days    Minutes of Exercise per Session: 30 min  Stress: No Stress Concern Present (11/16/2023)   [employer] of Occupational Health - Occupational Stress Questionnaire    Feeling of Stress : Not at all  Social Connections: Socially Integrated (11/16/2023)   Social Connection and Isolation Panel    Frequency of Communication with Friends and Family: More than three times a week    Frequency of Social Gatherings with Friends and Family:  Once a week    Attends Religious Services: More than 4 times per year    Active Member of Clubs or Organizations: No    Attends Engineer, Structural: More than 4 times per year    Marital Status: Married  Catering Manager Violence: Not on file    Past Surgical History:  Procedure Laterality Date   TUBAL LIGATION      Family History  Problem Relation Age of Onset   Heart attack Mother 69   Hypertension Father    Heart attack Father 12   Hypertension Sister    Hypertension Brother    Arrhythmia Brother    Hypertension Sister     Allergies  Allergen Reactions   Potassium-Containing Compounds Other (See Comments)    Severe cramps caused her to pass out.   Atorvastatin      Myalgia    Hctz [Hydrochlorothiazide ] Other  (See Comments)    Renal impairment    Simvastatin      Myalgia    Zetia  [Ezetimibe ] Other (See Comments)    Throat itching    Lisinopril  Other (See Comments)    Headache     Current Outpatient Medications on File Prior to Visit  Medication Sig Dispense Refill   albuterol  (VENTOLIN  HFA) 108 (90 Base) MCG/ACT inhaler INHALE 2 PUFFS INTO THE LUNGS EVERY 6 HOURS AS NEEDED FOR WHEEZING OR SHORTNESS OF BREATH 8.5 g 2   amLODipine  (NORVASC ) 10 MG tablet Take 10 mg by mouth daily.     aspirin-acetaminophen -caffeine (EXCEDRIN MIGRAINE) 250-250-65 MG tablet Take by mouth every 6 (six) hours as needed for headache.     cetirizine (ZYRTEC) 10 MG tablet Take 10 mg by mouth daily.     cholecalciferol (VITAMIN D3) 25 MCG (1000 UNIT) tablet Take 5,000 Units by mouth daily.     ezetimibe  (ZETIA ) 10 MG tablet TAKE 1 TABLET BY MOUTH EVERY DAY 90 tablet 3   Ferrous Sulfate (IRON PO) Take by mouth.     fluticasone  (FLONASE ) 50 MCG/ACT nasal spray Place 1 spray into both nostrils daily. 16 g 0   gabapentin  (NEURONTIN ) 100 MG capsule Take 2 capsules (200 mg total) by mouth at bedtime. 180 capsule 3   Multiple Vitamins-Minerals (WOMENS 50+ ADVANCED PO) Take by mouth.     omeprazole  (PRILOSEC) 20 MG capsule TAKE 1 CAPSULE BY MOUTH ONCE DAILY - Schedule physical for future refills 90 capsule 1   rosuvastatin  (CRESTOR ) 5 MG tablet TAKE 1 TABLET BY MOUTH EVERY DAY 90 tablet 3   telmisartan-hydrochlorothiazide  (MICARDIS HCT) 80-12.5 MG tablet Take 1 tablet by mouth daily.     valACYclovir  (VALTREX ) 1000 MG tablet TAKE 1 TABLET BY MOUTH 2 TIMES DAILY FOR 10 DAYS AS NEEDED FOR flare 90 tablet 0   No current facility-administered medications on file prior to visit.    BP 132/82   Pulse 78   Temp 98.6 F (37 C)   Wt 242 lb (109.8 kg)   SpO2 98%   BMI 42.87 kg/m       Objective:   Physical Exam Vitals and nursing note reviewed.  Constitutional:      Appearance: Normal appearance.  Cardiovascular:      Rate and Rhythm: Normal rate and regular rhythm.     Pulses: Normal pulses.     Heart sounds: Normal heart sounds.  Pulmonary:     Effort: Pulmonary effort is normal.     Breath sounds: Normal breath sounds.  Abdominal:     General: Abdomen is  flat. Bowel sounds are normal.     Palpations: Abdomen is soft.     Tenderness: There is abdominal tenderness in the suprapubic area. There is no right CVA tenderness or left CVA tenderness.  Musculoskeletal:        General: Normal range of motion.       Back:  Skin:    General: Skin is warm and dry.  Neurological:     General: No focal deficit present.     Mental Status: She is alert and oriented to person, place, and time.  Psychiatric:        Mood and Affect: Mood normal.        Behavior: Behavior normal.        Thought Content: Thought content normal.        Judgment: Judgment normal.        Assessment & Plan:  Assessment and Plan    Low back pain Chronic low back pain likely muscular, possibly due to strain. - Prescribed Flexeril  at nighttime. - Advised Tylenol  for pain.  Vaginal irritation and discharge Vaginal irritation with discharge, differential includes yeast infection and bacterial vaginosis. Awaiting culture results. - Prescribed Diflucan for yeast infection. - Awaiting vaginal culture results for further treatment. - Stop cleaning vagina with soap   Abdominal cramping  - Possibly related to menstrual cycle - UA positive for leukocytes but negative for everything else  - Will check culture  - Possibly from BV      Darleene Shape, NP

## 2024-05-31 LAB — URINE CULTURE
MICRO NUMBER:: 17202534
SPECIMEN QUALITY:: ADEQUATE

## 2024-06-02 LAB — CERVICOVAGINAL ANCILLARY ONLY
Bacterial Vaginitis (gardnerella): NEGATIVE
Candida Glabrata: NEGATIVE
Candida Vaginitis: NEGATIVE
Comment: NEGATIVE
Comment: NEGATIVE
Comment: NEGATIVE

## 2024-06-03 ENCOUNTER — Encounter: Payer: Self-pay | Admitting: Adult Health

## 2024-06-03 ENCOUNTER — Ambulatory Visit: Payer: Self-pay | Admitting: Adult Health

## 2024-06-03 NOTE — Telephone Encounter (Signed)
**Note De-identified  [place] Obfuscation** Please advise 

## 2024-07-11 ENCOUNTER — Encounter: Payer: Self-pay | Admitting: Adult Health

## 2024-07-11 DIAGNOSIS — N6310 Unspecified lump in the right breast, unspecified quadrant: Secondary | ICD-10-CM

## 2024-07-19 LAB — LAB REPORT - SCANNED
Albumin, Urine POC: 4.4
Creatinine, POC: 94.8 mg/dL
EGFR: 63
Microalb Creat Ratio: 5

## 2024-07-22 ENCOUNTER — Encounter: Payer: Self-pay | Admitting: Obstetrics and Gynecology

## 2024-07-22 ENCOUNTER — Ambulatory Visit: Admitting: Obstetrics and Gynecology

## 2024-07-22 VITALS — BP 143/90 | HR 85 | Ht 63.0 in | Wt 247.0 lb

## 2024-07-22 DIAGNOSIS — Z01419 Encounter for gynecological examination (general) (routine) without abnormal findings: Secondary | ICD-10-CM

## 2024-07-22 NOTE — Progress Notes (Signed)
 Pt states she has a spot inside vagina, PCP referred to us  for follow up.

## 2024-07-22 NOTE — Progress Notes (Signed)
 53 yo P5 referred by PCP for evaluation of an area of concerns in vaginal wall. Patient reports being aware of that lesion for several years. It has not changed in size. She denies any vaginal irritation. She is sexually active without complaints. Patient denies any urinary or issues with bowel movement  Past Medical History:  Diagnosis Date   Enlarged tonsils    Herpes    Hypertension    Migraines    Morbid obesity (HCC)    Vasovagal syncope 10/24/2017   Past Surgical History:  Procedure Laterality Date   TUBAL LIGATION     Family History  Problem Relation Age of Onset   Heart attack Mother 33   Hypertension Father    Heart attack Father 63   Hypertension Sister    Hypertension Brother    Arrhythmia Brother    Hypertension Sister    Social History   Socioeconomic History   Marital status: Married    Spouse name: Not on file   Number of children: Not on file   Years of education: Not on file   Highest education level: Bachelor's degree (e.g., BA, AB, BS)  Occupational History   Not on file  Tobacco Use   Smoking status: Never   Smokeless tobacco: Never  Vaping Use   Vaping status: Never Used  Substance and Sexual Activity   Alcohol use: No   Drug use: No   Sexual activity: Yes    Birth control/protection: Surgical  Other Topics Concern   Not on file  Social History Narrative   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, broadcasting/film/video   Married for 24 years    Five children all live locally   Social Drivers of Health   Tobacco Use: Low Risk (05/29/2024)   Patient History    Smoking Tobacco Use: Never    Smokeless Tobacco Use: Never    Passive Exposure: Not on file  Financial Resource Strain: Low Risk (11/16/2023)   Overall Financial Resource Strain (CARDIA)    Difficulty of Paying Living Expenses: Not hard at all  Food Insecurity: No Food Insecurity (11/16/2023)   Hunger Vital Sign    Worried About Running Out of Food in the Last Year: Never true    Ran Out of Food in the Last  Year: Never true  Transportation Needs: No Transportation Needs (11/16/2023)   PRAPARE - Administrator, Civil Service (Medical): No    Lack of Transportation (Non-Medical): No  Physical Activity: Insufficiently Active (11/16/2023)   Exercise Vital Sign    Days of Exercise per Week: 2 days    Minutes of Exercise per Session: 30 min  Stress: No Stress Concern Present (11/16/2023)   [employer] of Occupational Health - Occupational Stress Questionnaire    Feeling of Stress : Not at all  Social Connections: Socially Integrated (11/16/2023)   Social Connection and Isolation Panel    Frequency of Communication with Friends and Family: More than three times a week    Frequency of Social Gatherings with Friends and Family: Once a week    Attends Religious Services: More than 4 times per year    Active Member of Clubs or Organizations: No    Attends Banker Meetings: More than 4 times per year    Marital Status: Married  Depression (PHQ2-9): Low Risk (10/03/2023)   Depression (PHQ2-9)    PHQ-2 Score: 0  Alcohol Screen: Not on file  Housing: Low Risk (11/16/2023)   Housing Stability Vital  Sign    Unable to Pay for Housing in the Last Year: No    Number of Times Moved in the Last Year: 0    Homeless in the Last Year: No  Utilities: Not on file  Health Literacy: Not on file     ROS See pertinent in HPI. All other systems reviewed and non contributory Blood pressure (!) 143/90, pulse 85, height 5' 3 (1.6 m), weight 247 lb (112 kg), last menstrual period 06/30/2024.  GENERAL: Well-developed, well-nourished female in no acute distress.  ABDOMEN: Soft, nontender, nondistended. No organomegaly. PELVIC: Normal external female genitalia. Vagina is pink and rugated with an area on patient's left darker in color. No clear lesions or pain.  Prolapsed anterior vaginal wall. Normal discharge. Normal appearing cervix. Uterus is normal in size. No adnexal mass or tenderness.  Chaperone present during the pelvic exam EXTREMITIES: No cyanosis, clubbing, or edema, 2+ distal pulses.  A/P 53 yo with  - Patient current on pap smear and colon cancer screening - Screening mammogram ordered by PCP and patient is looking for a location that will take her new insurance - Reassurance on pelvic exam provided. No interventions needed

## 2024-08-06 ENCOUNTER — Ambulatory Visit: Payer: Self-pay

## 2024-08-06 ENCOUNTER — Ambulatory Visit (INDEPENDENT_AMBULATORY_CARE_PROVIDER_SITE_OTHER): Admitting: Family Medicine

## 2024-08-06 VITALS — BP 136/90 | HR 91 | Temp 97.8°F | Wt 240.9 lb

## 2024-08-06 DIAGNOSIS — M542 Cervicalgia: Secondary | ICD-10-CM | POA: Diagnosis not present

## 2024-08-06 DIAGNOSIS — I499 Cardiac arrhythmia, unspecified: Secondary | ICD-10-CM | POA: Diagnosis not present

## 2024-08-06 DIAGNOSIS — R300 Dysuria: Secondary | ICD-10-CM | POA: Diagnosis not present

## 2024-08-06 DIAGNOSIS — I10 Essential (primary) hypertension: Secondary | ICD-10-CM | POA: Diagnosis not present

## 2024-08-06 LAB — POC URINALSYSI DIPSTICK (AUTOMATED)
Bilirubin, UA: NEGATIVE
Glucose, UA: NEGATIVE
Ketones, UA: NEGATIVE
Nitrite, UA: NEGATIVE
Protein, UA: NEGATIVE
Spec Grav, UA: 1.015
Urobilinogen, UA: 0.2 U/dL
pH, UA: 6

## 2024-08-06 NOTE — Progress Notes (Signed)
 "  Established Patient Office Visit  Subjective   Patient ID: Janet Sanchez, female    DOB: February 16, 1971  Age: 54 y.o. MRN: [id number]  Chief Complaint  Patient presents with   Neck Pain    HPI    Janet Sanchez has history of hypertension, chronic kidney disease stage IIIa, GERD, obesity.  She is followed by nephrologist.  She is seen today with some mild left anterior neck soreness.  Has not noted any definite adenopathy.  No known injury.  No sore throat or fever.  She does help care for her children ages 36-5.  Does not do any significant lifting.  She states she saw her gynecologist recently and was diagnosed with cystocele.  History of nonvaginal deliveries.  Occasional incontinence but not consistently.  She does relate some urine frequency but currently on her menstrual period.  No recent abnormal vaginal bleeding.  No recent fevers or chills.  She was noted to have some premature beats on initial exam today and in looking back through her records had echo 2018 with only trivial regurgitation involving aortic, mitral, and tricuspid valve.  Heart monitor 3/25 no significant arrhythmias.  No significant caffeine use.  No alcohol.  Has not had any recent major palpitations or syncope.  No chest pains.  Does have some exertional dyspnea which she thinks may be more deconditioning but no exertional chest pains.  Blood pressure treated with medications including amlodipine  and telmisartan HCTZ.  Blood pressure generally fairly well-controlled.  Past Medical History:  Diagnosis Date   Enlarged tonsils    Herpes    Hypertension    Migraines    Morbid obesity (HCC)    Vasovagal syncope 10/24/2017   Past Surgical History:  Procedure Laterality Date   TUBAL LIGATION      reports that she has never smoked. She has never used smokeless tobacco. She reports that she does not drink alcohol and does not use drugs. family history includes Arrhythmia in her brother; Heart attack (age of onset:  15) in her mother; Heart attack (age of onset: 57) in her father; Hypertension in her brother, father, sister, and sister. Allergies[1]   Review of Systems  Constitutional:  Negative for chills and fever.  Respiratory:  Negative for cough and wheezing.   Cardiovascular:  Negative for chest pain, palpitations and leg swelling.  Genitourinary:  Positive for dysuria and frequency.  Neurological:  Negative for loss of consciousness.      Objective:     BP (!) 136/90 (BP Location: Left Arm, Cuff Size: Large)   Pulse 91   Temp 97.8 F (36.6 C) (Oral)   Wt 240 lb 14.4 oz (109.3 kg)   LMP 06/30/2024   SpO2 94%   BMI 42.67 kg/m  BP Readings from Last 3 Encounters:  08/06/24 (!) 136/90  07/22/24 (!) 143/90  05/29/24 132/82   Wt Readings from Last 3 Encounters:  08/06/24 240 lb 14.4 oz (109.3 kg)  07/22/24 247 lb (112 kg)  05/29/24 242 lb (109.8 kg)      Physical Exam Vitals reviewed.  Constitutional:      Appearance: She is well-developed.  HENT:     Mouth/Throat:     Mouth: Mucous membranes are moist.     Pharynx: Oropharynx is clear. No posterior oropharyngeal erythema.  Eyes:     Pupils: Pupils are equal, round, and reactive to light.  Neck:     Thyroid: No thyromegaly.     Vascular: No JVD.  Cardiovascular:  Rate and Rhythm: Normal rate.     Heart sounds:     No gallop.     Comments: On initial exam had frequent premature beats by auscultation and palpation. Pulmonary:     Effort: Pulmonary effort is normal. No respiratory distress.     Breath sounds: Normal breath sounds. No wheezing or rales.  Musculoskeletal:     Cervical back: Normal range of motion and neck supple. No tenderness.  Lymphadenopathy:     Cervical: No cervical adenopathy.  Neurological:     Mental Status: She is alert.      Results for orders placed or performed in visit on 08/06/24  POCT Urinalysis Dipstick (Automated)  Result Value Ref Range   Color, UA yellow    Clarity, UA  clear    Glucose, UA Negative Negative   Bilirubin, UA negative    Ketones, UA negative    Spec Grav, UA 1.015 1.010 - 1.025   Blood, UA 3+    pH, UA 6.0 5.0 - 8.0   Protein, UA Negative Negative   Urobilinogen, UA 0.2 0.2 or 1.0 E.U./dL   Nitrite, UA negative    Leukocytes, UA Moderate (2+) (A) Negative    Last CBC Lab Results  Component Value Date   WBC 4.5 11/16/2023   HGB 12.5 11/16/2023   HCT 38.0 11/16/2023   MCV 90.5 11/16/2023   MCH 29.2 10/01/2023   RDW 13.7 11/16/2023   PLT 299.0 11/16/2023   Last metabolic panel Lab Results  Component Value Date   GLUCOSE 80 11/16/2023   NA 138 11/16/2023   K 3.7 11/16/2023   CL 100 11/16/2023   CO2 31 11/16/2023   BUN 16 11/16/2023   CREATININE 1.12 11/16/2023   EGFR 63.0 07/19/2024   CALCIUM  9.8 11/16/2023   PROT 8.1 11/16/2023   ALBUMIN 4.2 11/16/2023   BILITOT 0.6 11/16/2023   ALKPHOS 66 11/16/2023   AST 23 11/16/2023   ALT 18 11/16/2023   ANIONGAP 9 10/01/2023   Last lipids Lab Results  Component Value Date   CHOL 171 11/16/2023   HDL 66.10 11/16/2023   LDLCALC 91 11/16/2023   TRIG 72.0 11/16/2023   CHOLHDL 3 11/16/2023   Last hemoglobin A1c Lab Results  Component Value Date   HGBA1C 5.5 08/18/2022   Last thyroid functions Lab Results  Component Value Date   TSH 1.15 11/16/2023      The 10-year ASCVD risk score (Arnett DK, et al., 2019) is: 3.8%    Assessment & Plan:   #1 recent urine frequency for the past week.  Urine dipstick does reveal leukocytes and blood but she is finishing up her menstrual period which likely accounts for that.  She does not have any current burning with urination and no fever.  Send urine culture to be sure.  Follow-up promptly for any fever.  Stay well-hydrated.  #2 anterior neck pain.  No significant adenopathy noted on exam.  No supraclavicular adenopathy.  No reproducible tenderness.  Etiology unclear.  No associated chest pain.  #3 irregular heart rhythm on  initial exam.  Suspect probably PVCs or PACs.  EKG shows sinus rhythm with no acute changes.  She is not having any dizziness or syncope.  Previous heart evaluation as above including echo and outpatient cardiac monitor last March.  Discussed importance of staying well-hydrated and avoidance of caffeine and alcohol.  Continue follow-up with cardiology.  #4 hypertension.  Up today but did improve some after rest.  Continue close follow-up  with nephrology.  Continue low-sodium diet.   No follow-ups on file.    Wolm Scarlet, MD     [1]  Allergies Allergen Reactions   Potassium-Containing Compounds Other (See Comments)    Severe cramps caused her to pass out.   Atorvastatin      Myalgia    Hctz [Hydrochlorothiazide ] Other (See Comments)    Renal impairment    Simvastatin      Myalgia    Zetia  [Ezetimibe ] Other (See Comments)    Throat itching    Lisinopril  Other (See Comments)    Headache    "

## 2024-08-06 NOTE — Telephone Encounter (Signed)
 "   FYI Only or Action Required?: FYI only for provider: appointment scheduled on 08/06/2024 at 4:30pm at PCP office with Dr Wolm Scarlet.  Patient was last seen in primary care on 05/29/2024 by Merna Huxley, NP.  Called Nurse Triage reporting Neck Pain and Urinary Frequency.  Symptoms began neck pain a week ago and urinary frequency for months per patient.  Interventions attempted: Rest, hydration, or home remedies.  Symptoms are: unchanged.  Triage Disposition: See Physician Within 24 Hours  Patient/caregiver understands and will follow disposition?: Yes           Copied from CRM [id number]. Topic: Clinical - Red Word Triage >> Aug 06, 2024 11:11 AM Eva FALCON wrote: Red Word that prompted transfer to Nurse Triage: left side neck pain and travels down to shoulder. been hurting for about a week. Comes and goes and gets worst at night unable to get comfortable, trouble sleeping, also having experiencing frequent urination. Reason for Disposition  Urinating more frequently than usual (i.e., frequency) OR new-onset of the feeling of an urgent need to urinate (i.e., urgency)  Answer Assessment - Initial Assessment Questions Left side of neck started hurting a week ago--no known injuries---patient thinks she might have slept on it wrong--she isnt sure. Pain in the left shoulder area as well. Patient states she believes she is feeling swelling in the left collarbone area. Patient states she has carpal tunnel that comes and goes. She also states that her left elbow sometimes has numbness that comes and goes but also does the right elbow---this has been going on for a while Patient states she can sleep   Patient has also been experiencing frequent urination She states she just went to her gynecologist and they told her at that appointment that her bladder was low---she was advised to let her providers know if she starts having any pain. Patient has been having frequent urination  for a couple of months she states Patient states she has a kidney doctor too and was told that she had kidney disease but doesn't know what else she is supposed to be looking for as far as that is concerned.  133/88 and heart rate was 84 last night when patient took her blood pressure. 2nd time patient checked it it was 135/86 and heart rate to 70s.  Patient denies difficulty breathing and any injuries that she is aware of.  Patient is advised to call us  back if anything changes or with any further questions/concerns. Patient is advised that if anything worsens to go to the Emergency Room. Patient verbalized understanding.  Answer Assessment - Initial Assessment Questions Left side of neck started hurting a week ago--no known injuries---patient thinks she might have slept on it wrong--she isnt sure. Pain in the left shoulder area as well. Patient states she believes she is feeling swelling in the left collarbone area. Patient states she has carpal tunnel that comes and goes. She also states that her left elbow sometimes has numbness that comes and goes but also does the right elbow---this has been going on for a while Patient states she can sleep   Patient has also been experiencing frequent urination She states she just went to her gynecologist and they told her at that appointment that her bladder was low---she was advised to let her providers know if she starts having any pain. Patient has been having frequent urination for a couple of months she states Patient states she has a kidney doctor too and was told that she  had kidney disease but doesn't know what else she is supposed to be looking for as far as that is concerned.  133/88 and heart rate was 84 last night when patient took her blood pressure. 2nd time patient checked it it was 135/86 and heart rate to 70s.  Patient denies difficulty breathing and any injuries that she is aware of.  Patient is advised to call us  back if  anything changes or with any further questions/concerns. Patient is advised that if anything worsens to go to the Emergency Room. Patient verbalized understanding.  Protocols used: Neck Pain or Stiffness-A-AH, Urinary Symptoms-A-AH  "

## 2024-08-07 ENCOUNTER — Ambulatory Visit: Payer: Self-pay | Admitting: Family Medicine

## 2024-08-08 LAB — URINE CULTURE
MICRO NUMBER:: 17473650
SPECIMEN QUALITY:: ADEQUATE

## 2024-08-12 ENCOUNTER — Telehealth: Payer: Self-pay

## 2024-08-12 NOTE — Telephone Encounter (Unsigned)
 Copied from CRM #[id number]. Topic: Clinical - Lab/Test Results >> Aug 11, 2024  9:12 AM Donna BRAVO wrote: Reason for CRM: Patient returning missed call regarding lab results and would like to speak with a nurse

## 2024-08-13 NOTE — Telephone Encounter (Signed)
Pt notified of update and verbalized understanding.
